# Patient Record
Sex: Male | Born: 1940 | Race: Black or African American | Hispanic: No | Marital: Married | Smoking: Never smoker
Health system: Southern US, Community
[De-identification: ages and names within clinical notes are randomized; demographics above are authoritative.]

## PROBLEM LIST (undated history)

## (undated) DIAGNOSIS — I10 Essential (primary) hypertension: Secondary | ICD-10-CM

## (undated) DIAGNOSIS — M1990 Unspecified osteoarthritis, unspecified site: Secondary | ICD-10-CM

## (undated) DIAGNOSIS — I639 Cerebral infarction, unspecified: Secondary | ICD-10-CM

## (undated) DIAGNOSIS — Z950 Presence of cardiac pacemaker: Secondary | ICD-10-CM

## (undated) DIAGNOSIS — R569 Unspecified convulsions: Secondary | ICD-10-CM

## (undated) DIAGNOSIS — I739 Peripheral vascular disease, unspecified: Secondary | ICD-10-CM

## (undated) DIAGNOSIS — F0390 Unspecified dementia without behavioral disturbance: Secondary | ICD-10-CM

## (undated) DIAGNOSIS — I779 Disorder of arteries and arterioles, unspecified: Secondary | ICD-10-CM

## (undated) DIAGNOSIS — C61 Malignant neoplasm of prostate: Secondary | ICD-10-CM

## (undated) HISTORY — PX: PACEMAKER INSERTION: SHX728

## (undated) HISTORY — PX: OTHER SURGICAL HISTORY: SHX169

## (undated) HISTORY — PX: PROSTATE CRYOABLATION: SUR358

## (undated) HISTORY — PX: CAROTID STENT: SHX1301

---

## 2010-05-22 ENCOUNTER — Encounter: Payer: Self-pay | Admitting: Internal Medicine

## 2010-06-01 ENCOUNTER — Encounter: Payer: Self-pay | Admitting: Orthopedic Surgery

## 2010-06-02 ENCOUNTER — Encounter: Payer: Self-pay | Admitting: Internal Medicine

## 2010-06-02 ENCOUNTER — Encounter: Payer: Self-pay | Admitting: Orthopedic Surgery

## 2010-07-03 ENCOUNTER — Encounter: Payer: Self-pay | Admitting: Orthopedic Surgery

## 2010-07-19 ENCOUNTER — Emergency Department: Payer: Self-pay | Admitting: Emergency Medicine

## 2010-08-02 ENCOUNTER — Encounter: Payer: Self-pay | Admitting: Orthopedic Surgery

## 2010-11-20 ENCOUNTER — Emergency Department: Payer: Self-pay | Admitting: Emergency Medicine

## 2011-06-06 ENCOUNTER — Other Ambulatory Visit: Payer: Self-pay

## 2011-08-15 ENCOUNTER — Other Ambulatory Visit: Payer: Self-pay

## 2011-09-03 ENCOUNTER — Other Ambulatory Visit: Payer: Self-pay

## 2011-09-13 ENCOUNTER — Other Ambulatory Visit: Payer: Self-pay

## 2011-09-13 LAB — CBC WITH DIFFERENTIAL/PLATELET
Basophil #: 0 10*3/uL (ref 0.0–0.1)
Eosinophil #: 0.3 10*3/uL (ref 0.0–0.7)
MCH: 32.7 pg (ref 26.0–34.0)
MCHC: 33.7 g/dL (ref 32.0–36.0)
Monocyte #: 0.6 10*3/uL (ref 0.0–0.7)
Neutrophil #: 1.9 10*3/uL (ref 1.4–6.5)
Neutrophil %: 47.4 %
Platelet: 122 10*3/uL — ABNORMAL LOW (ref 150–440)
RBC: 3.78 10*6/uL — ABNORMAL LOW (ref 4.40–5.90)

## 2011-09-13 LAB — ALT: SGPT (ALT): 38 U/L

## 2011-10-04 ENCOUNTER — Other Ambulatory Visit: Payer: Self-pay

## 2012-10-13 ENCOUNTER — Emergency Department: Payer: Self-pay | Admitting: Emergency Medicine

## 2012-10-13 LAB — COMPREHENSIVE METABOLIC PANEL
BUN: 16 mg/dL (ref 7–18)
Bilirubin,Total: 0.5 mg/dL (ref 0.2–1.0)
Co2: 27 mmol/L (ref 21–32)
Creatinine: 0.96 mg/dL (ref 0.60–1.30)
EGFR (African American): >60
EGFR (Non-African Amer.): >60
Osmolality: 280 (ref 275–301)
Potassium: 4.2 mmol/L (ref 3.5–5.1)
SGOT(AST): 24 U/L (ref 15–37)
SGPT (ALT): 17 U/L (ref 12–78)
Sodium: 140 mmol/L (ref 136–145)
Total Protein: 7.7 g/dL (ref 6.4–8.2)

## 2012-10-13 LAB — CBC
HGB: 13.3 g/dL (ref 13.0–18.0)
MCH: 32.1 pg (ref 26.0–34.0)
RDW: 14.6 % — ABNORMAL HIGH (ref 11.5–14.5)
WBC: 4.5 10*3/uL (ref 3.8–10.6)

## 2012-10-13 LAB — TROPONIN I: Troponin-I: <0.02 ng/mL

## 2012-10-13 LAB — CK TOTAL AND CKMB (NOT AT ARMC): CK, Total: 55 U/L (ref 35–232)

## 2012-10-13 LAB — PRO B NATRIURETIC PEPTIDE: B-Type Natriuretic Peptide: 649 pg/mL — ABNORMAL HIGH (ref 0–125)

## 2013-12-15 LAB — URINALYSIS, COMPLETE
BILIRUBIN, UR: NEGATIVE
Bacteria: NONE SEEN
Blood: NEGATIVE
Glucose,UR: NEGATIVE mg/dL (ref 0–75)
Hyaline Cast: 4
Ketone: NEGATIVE
Leukocyte Esterase: NEGATIVE
Nitrite: NEGATIVE
Ph: 5 (ref 4.5–8.0)
Protein: NEGATIVE
RBC,UR: <1 /HPF (ref 0–5)
SPECIFIC GRAVITY: 1.017 (ref 1.003–1.030)
Squamous Epithelial: <1

## 2013-12-15 LAB — CBC
HCT: 34.7 % — ABNORMAL LOW (ref 40.0–52.0)
HGB: 11.6 g/dL — AB (ref 13.0–18.0)
MCH: 30.9 pg (ref 26.0–34.0)
MCHC: 33.5 g/dL (ref 32.0–36.0)
MCV: 92 fL (ref 80–100)
Platelet: 128 10*3/uL — ABNORMAL LOW (ref 150–440)
RBC: 3.77 10*6/uL — AB (ref 4.40–5.90)
RDW: 14.8 % — AB (ref 11.5–14.5)
WBC: 5.2 10*3/uL (ref 3.8–10.6)

## 2013-12-15 LAB — COMPREHENSIVE METABOLIC PANEL
ALT: 16 U/L (ref 12–78)
Albumin: 2.9 g/dL — ABNORMAL LOW (ref 3.4–5.0)
Alkaline Phosphatase: 56 U/L
Anion Gap: 4 — ABNORMAL LOW (ref 7–16)
BUN: 19 mg/dL — AB (ref 7–18)
Bilirubin,Total: 0.6 mg/dL (ref 0.2–1.0)
Calcium, Total: 8.3 mg/dL — ABNORMAL LOW (ref 8.5–10.1)
Chloride: 107 mmol/L (ref 98–107)
Co2: 26 mmol/L (ref 21–32)
Creatinine: 1.19 mg/dL (ref 0.60–1.30)
EGFR (Non-African Amer.): 60 — ABNORMAL LOW
GLUCOSE: 116 mg/dL — AB (ref 65–99)
Osmolality: 277 (ref 275–301)
POTASSIUM: 3.8 mmol/L (ref 3.5–5.1)
SGOT(AST): 24 U/L (ref 15–37)
Sodium: 137 mmol/L (ref 136–145)
TOTAL PROTEIN: 7.4 g/dL (ref 6.4–8.2)

## 2013-12-15 LAB — TROPONIN I: Troponin-I: <0.02 ng/mL

## 2013-12-16 ENCOUNTER — Inpatient Hospital Stay: Payer: Self-pay | Admitting: Specialist

## 2014-01-21 ENCOUNTER — Emergency Department: Payer: Self-pay | Admitting: Emergency Medicine

## 2014-01-21 LAB — COMPREHENSIVE METABOLIC PANEL
ANION GAP: 4 — AB (ref 7–16)
AST: 24 U/L (ref 15–37)
Albumin: 2.8 g/dL — ABNORMAL LOW (ref 3.4–5.0)
Alkaline Phosphatase: 51 U/L
BUN: 25 mg/dL — ABNORMAL HIGH (ref 7–18)
Bilirubin,Total: 0.6 mg/dL (ref 0.2–1.0)
CHLORIDE: 107 mmol/L (ref 98–107)
Calcium, Total: 8.4 mg/dL — ABNORMAL LOW (ref 8.5–10.1)
Co2: 28 mmol/L (ref 21–32)
Creatinine: 1.32 mg/dL — ABNORMAL HIGH (ref 0.60–1.30)
EGFR (African American): >60
GFR CALC NON AF AMER: 53 — AB
GLUCOSE: 90 mg/dL (ref 65–99)
Osmolality: 281 (ref 275–301)
POTASSIUM: 4.1 mmol/L (ref 3.5–5.1)
SGPT (ALT): 11 U/L — ABNORMAL LOW (ref 12–78)
Sodium: 139 mmol/L (ref 136–145)
TOTAL PROTEIN: 7 g/dL (ref 6.4–8.2)

## 2014-01-21 LAB — CBC
HCT: 33.2 % — ABNORMAL LOW (ref 40.0–52.0)
HGB: 11.4 g/dL — ABNORMAL LOW (ref 13.0–18.0)
MCH: 31.9 pg (ref 26.0–34.0)
MCHC: 34.3 g/dL (ref 32.0–36.0)
MCV: 93 fL (ref 80–100)
PLATELETS: 106 10*3/uL — AB (ref 150–440)
RBC: 3.56 10*6/uL — AB (ref 4.40–5.90)
RDW: 15.5 % — ABNORMAL HIGH (ref 11.5–14.5)
WBC: 4.7 10*3/uL (ref 3.8–10.6)

## 2014-01-21 LAB — TROPONIN I: Troponin-I: <0.02 ng/mL

## 2014-02-02 ENCOUNTER — Ambulatory Visit: Payer: Self-pay | Admitting: Vascular Surgery

## 2014-02-02 LAB — BASIC METABOLIC PANEL
Anion Gap: 5 — ABNORMAL LOW (ref 7–16)
BUN: 21 mg/dL — ABNORMAL HIGH (ref 7–18)
CO2: 29 mmol/L (ref 21–32)
Calcium, Total: 8.9 mg/dL (ref 8.5–10.1)
Chloride: 103 mmol/L (ref 98–107)
Creatinine: 1.37 mg/dL — ABNORMAL HIGH (ref 0.60–1.30)
EGFR (African American): 58 — ABNORMAL LOW
EGFR (Non-African Amer.): 50 — ABNORMAL LOW
Glucose: 93 mg/dL (ref 65–99)
OSMOLALITY: 276 (ref 275–301)
Potassium: 3.7 mmol/L (ref 3.5–5.1)
Sodium: 137 mmol/L (ref 136–145)

## 2014-02-02 LAB — CBC
HCT: 36.2 % — AB (ref 40.0–52.0)
HGB: 12.3 g/dL — ABNORMAL LOW (ref 13.0–18.0)
MCH: 31.8 pg (ref 26.0–34.0)
MCHC: 34 g/dL (ref 32.0–36.0)
MCV: 94 fL (ref 80–100)
Platelet: 122 10*3/uL — ABNORMAL LOW (ref 150–440)
RBC: 3.88 10*6/uL — ABNORMAL LOW (ref 4.40–5.90)
RDW: 15.2 % — ABNORMAL HIGH (ref 11.5–14.5)
WBC: 4 10*3/uL (ref 3.8–10.6)

## 2014-02-02 LAB — URINALYSIS, COMPLETE
BACTERIA: NONE SEEN
Bilirubin,UR: NEGATIVE
Blood: NEGATIVE
Glucose,UR: NEGATIVE mg/dL (ref 0–75)
Hyaline Cast: 31
KETONE: NEGATIVE
Leukocyte Esterase: NEGATIVE
Nitrite: NEGATIVE
PROTEIN: NEGATIVE
Ph: 5 (ref 4.5–8.0)
RBC,UR: 2 /HPF (ref 0–5)
Specific Gravity: 1.023 (ref 1.003–1.030)
Squamous Epithelial: 1
WBC UR: 2 /HPF (ref 0–5)

## 2014-02-09 LAB — COMPREHENSIVE METABOLIC PANEL
ALBUMIN: 2.8 g/dL — AB (ref 3.4–5.0)
Alkaline Phosphatase: 51 U/L
Anion Gap: 2 — ABNORMAL LOW (ref 7–16)
BUN: 22 mg/dL — ABNORMAL HIGH (ref 7–18)
Bilirubin,Total: 0.9 mg/dL (ref 0.2–1.0)
CHLORIDE: 110 mmol/L — AB (ref 98–107)
CREATININE: 1.34 mg/dL — AB (ref 0.60–1.30)
Calcium, Total: 8.4 mg/dL — ABNORMAL LOW (ref 8.5–10.1)
Co2: 28 mmol/L (ref 21–32)
EGFR (African American): >60
EGFR (Non-African Amer.): 52 — ABNORMAL LOW
Glucose: 83 mg/dL (ref 65–99)
Osmolality: 282 (ref 275–301)
Potassium: 4.1 mmol/L (ref 3.5–5.1)
SGOT(AST): 22 U/L (ref 15–37)
SGPT (ALT): 11 U/L — ABNORMAL LOW (ref 12–78)
Sodium: 140 mmol/L (ref 136–145)
TOTAL PROTEIN: 6.5 g/dL (ref 6.4–8.2)

## 2014-02-09 LAB — CBC WITH DIFFERENTIAL/PLATELET
BASOS ABS: 0 10*3/uL (ref 0.0–0.1)
BASOS PCT: 1 %
EOS ABS: 0.2 10*3/uL (ref 0.0–0.7)
Eosinophil %: 3.5 %
HCT: 33.1 % — ABNORMAL LOW (ref 40.0–52.0)
HGB: 11 g/dL — ABNORMAL LOW (ref 13.0–18.0)
Lymphocyte #: 1 10*3/uL (ref 1.0–3.6)
Lymphocyte %: 23.9 %
MCH: 31.1 pg (ref 26.0–34.0)
MCHC: 33.4 g/dL (ref 32.0–36.0)
MCV: 93 fL (ref 80–100)
MONO ABS: 0.6 x10 3/mm (ref 0.2–1.0)
Monocyte %: 12.9 %
Neutrophil #: 2.5 10*3/uL (ref 1.4–6.5)
Neutrophil %: 58.7 %
PLATELETS: 106 10*3/uL — AB (ref 150–440)
RBC: 3.56 10*6/uL — ABNORMAL LOW (ref 4.40–5.90)
RDW: 15.2 % — ABNORMAL HIGH (ref 11.5–14.5)
WBC: 4.3 10*3/uL (ref 3.8–10.6)

## 2014-02-09 LAB — CK TOTAL AND CKMB (NOT AT ARMC)
CK, Total: 29 U/L — ABNORMAL LOW
CK, Total: 33 U/L — ABNORMAL LOW
CK-MB: 0.6 ng/mL (ref 0.5–3.6)
CK-MB: 0.7 ng/mL (ref 0.5–3.6)

## 2014-02-09 LAB — TROPONIN I
Troponin-I: <0.02 ng/mL
Troponin-I: <0.02 ng/mL

## 2014-02-09 LAB — APTT: Activated PTT: 25.7 secs (ref 23.6–35.9)

## 2014-02-10 ENCOUNTER — Inpatient Hospital Stay: Payer: Self-pay | Admitting: Internal Medicine

## 2014-02-10 LAB — LIPID PANEL
Cholesterol: 104 mg/dL (ref 0–200)
HDL: 35 mg/dL — AB (ref 40–60)
Ldl Cholesterol, Calc: 59 mg/dL (ref 0–100)
TRIGLYCERIDES: 50 mg/dL (ref 0–200)
VLDL Cholesterol, Calc: 10 mg/dL (ref 5–40)

## 2014-02-10 LAB — CK TOTAL AND CKMB (NOT AT ARMC)
CK, Total: 38 U/L — ABNORMAL LOW
CK-MB: 0.8 ng/mL (ref 0.5–3.6)

## 2014-02-10 LAB — BASIC METABOLIC PANEL
ANION GAP: 3 — AB (ref 7–16)
BUN: 21 mg/dL — ABNORMAL HIGH (ref 7–18)
CHLORIDE: 109 mmol/L — AB (ref 98–107)
CO2: 28 mmol/L (ref 21–32)
CREATININE: 1.06 mg/dL (ref 0.60–1.30)
Calcium, Total: 8.2 mg/dL — ABNORMAL LOW (ref 8.5–10.1)
EGFR (Non-African Amer.): >60
Glucose: 84 mg/dL (ref 65–99)
Osmolality: 282 (ref 275–301)
Potassium: 3.7 mmol/L (ref 3.5–5.1)
Sodium: 140 mmol/L (ref 136–145)

## 2014-02-10 LAB — CBC WITH DIFFERENTIAL/PLATELET
Basophil #: 0 10*3/uL (ref 0.0–0.1)
Basophil %: 0.8 %
EOS PCT: 8.6 %
Eosinophil #: 0.4 10*3/uL (ref 0.0–0.7)
HCT: 32.1 % — ABNORMAL LOW (ref 40.0–52.0)
HGB: 10.7 g/dL — AB (ref 13.0–18.0)
LYMPHS PCT: 33.7 %
Lymphocyte #: 1.5 10*3/uL (ref 1.0–3.6)
MCH: 31.1 pg (ref 26.0–34.0)
MCHC: 33.3 g/dL (ref 32.0–36.0)
MCV: 93 fL (ref 80–100)
Monocyte #: 0.6 x10 3/mm (ref 0.2–1.0)
Monocyte %: 13.9 %
NEUTROS PCT: 43 %
Neutrophil #: 1.9 10*3/uL (ref 1.4–6.5)
Platelet: 100 10*3/uL — ABNORMAL LOW (ref 150–440)
RBC: 3.44 10*6/uL — ABNORMAL LOW (ref 4.40–5.90)
RDW: 15.4 % — ABNORMAL HIGH (ref 11.5–14.5)
WBC: 4.3 10*3/uL (ref 3.8–10.6)

## 2014-02-10 LAB — TROPONIN I

## 2014-02-10 LAB — MAGNESIUM: Magnesium: 1.8 mg/dL

## 2014-02-10 LAB — TSH: THYROID STIMULATING HORM: 1.13 u[IU]/mL

## 2014-02-17 ENCOUNTER — Inpatient Hospital Stay: Payer: Self-pay | Admitting: Vascular Surgery

## 2014-02-17 LAB — BASIC METABOLIC PANEL
Anion Gap: 8 (ref 7–16)
BUN: 15 mg/dL (ref 7–18)
Calcium, Total: 8.6 mg/dL (ref 8.5–10.1)
Chloride: 108 mmol/L — ABNORMAL HIGH (ref 98–107)
Co2: 25 mmol/L (ref 21–32)
Creatinine: 0.94 mg/dL (ref 0.60–1.30)
EGFR (African American): >60
EGFR (Non-African Amer.): >60
GLUCOSE: 89 mg/dL (ref 65–99)
Osmolality: 282 (ref 275–301)
Potassium: 3.9 mmol/L (ref 3.5–5.1)
Sodium: 141 mmol/L (ref 136–145)

## 2014-02-17 LAB — CK TOTAL AND CKMB (NOT AT ARMC)
CK, TOTAL: 25 U/L — AB
CK-MB: 0.6 ng/mL (ref 0.5–3.6)

## 2014-02-17 LAB — TROPONIN I

## 2014-02-18 LAB — BASIC METABOLIC PANEL
ANION GAP: 6 — AB (ref 7–16)
BUN: 12 mg/dL (ref 7–18)
CO2: 24 mmol/L (ref 21–32)
Calcium, Total: 7.9 mg/dL — ABNORMAL LOW (ref 8.5–10.1)
Chloride: 110 mmol/L — ABNORMAL HIGH (ref 98–107)
Creatinine: 0.89 mg/dL (ref 0.60–1.30)
EGFR (Non-African Amer.): >60
GLUCOSE: 77 mg/dL (ref 65–99)
Osmolality: 278 (ref 275–301)
Potassium: 4 mmol/L (ref 3.5–5.1)
Sodium: 140 mmol/L (ref 136–145)

## 2014-02-18 LAB — CBC WITH DIFFERENTIAL/PLATELET
Basophil #: 0 10*3/uL (ref 0.0–0.1)
Basophil %: 0.5 %
EOS PCT: 7.7 %
Eosinophil #: 0.3 10*3/uL (ref 0.0–0.7)
HCT: 27.8 % — AB (ref 40.0–52.0)
HGB: 9.5 g/dL — AB (ref 13.0–18.0)
Lymphocyte #: 1 10*3/uL (ref 1.0–3.6)
Lymphocyte %: 22.9 %
MCH: 32.1 pg (ref 26.0–34.0)
MCHC: 34.3 g/dL (ref 32.0–36.0)
MCV: 93 fL (ref 80–100)
MONOS PCT: 12.2 %
Monocyte #: 0.5 x10 3/mm (ref 0.2–1.0)
NEUTROS PCT: 56.7 %
Neutrophil #: 2.5 10*3/uL (ref 1.4–6.5)
Platelet: 88 10*3/uL — ABNORMAL LOW (ref 150–440)
RBC: 2.97 10*6/uL — ABNORMAL LOW (ref 4.40–5.90)
RDW: 15.1 % — ABNORMAL HIGH (ref 11.5–14.5)
WBC: 4.5 10*3/uL (ref 3.8–10.6)

## 2014-02-18 LAB — PROTIME-INR
INR: 1.2
Prothrombin Time: 14.7 secs (ref 11.5–14.7)

## 2014-02-18 LAB — APTT: ACTIVATED PTT: 27.7 s (ref 23.6–35.9)

## 2014-04-23 ENCOUNTER — Emergency Department: Payer: Self-pay | Admitting: Emergency Medicine

## 2014-04-23 LAB — BASIC METABOLIC PANEL
ANION GAP: 8 (ref 7–16)
BUN: 23 mg/dL — ABNORMAL HIGH (ref 7–18)
CALCIUM: 8.3 mg/dL — AB (ref 8.5–10.1)
Chloride: 105 mmol/L (ref 98–107)
Co2: 24 mmol/L (ref 21–32)
Creatinine: 1.17 mg/dL (ref 0.60–1.30)
EGFR (African American): >60
Glucose: 114 mg/dL — ABNORMAL HIGH (ref 65–99)
Osmolality: 278 (ref 275–301)
Potassium: 3.9 mmol/L (ref 3.5–5.1)
SODIUM: 137 mmol/L (ref 136–145)

## 2014-04-23 LAB — DIFFERENTIAL
Basophil #: 0 10*3/uL (ref 0.0–0.1)
Basophil %: 0.2 %
EOS PCT: 3.5 %
Eosinophil #: 0.1 10*3/uL (ref 0.0–0.7)
LYMPHS ABS: 1 10*3/uL (ref 1.0–3.6)
Lymphocyte %: 34.5 %
Monocyte #: 0.3 x10 3/mm (ref 0.2–1.0)
Monocyte %: 10.1 %
NEUTROS PCT: 51.7 %
Neutrophil #: 1.5 10*3/uL (ref 1.4–6.5)

## 2014-04-23 LAB — CBC
HCT: 38.8 % — ABNORMAL LOW (ref 40.0–52.0)
HGB: 12.9 g/dL — ABNORMAL LOW (ref 13.0–18.0)
MCH: 32.4 pg (ref 26.0–34.0)
MCHC: 33.4 g/dL (ref 32.0–36.0)
MCV: 97 fL (ref 80–100)
Platelet: 105 10*3/uL — ABNORMAL LOW (ref 150–440)
RBC: 4 10*6/uL — AB (ref 4.40–5.90)
RDW: 15.3 % — AB (ref 11.5–14.5)
WBC: 2.9 10*3/uL — ABNORMAL LOW (ref 3.8–10.6)

## 2014-04-23 LAB — TROPONIN I: Troponin-I: <0.02 ng/mL

## 2014-06-10 ENCOUNTER — Observation Stay: Payer: Self-pay | Admitting: Internal Medicine

## 2014-06-10 LAB — CBC
HCT: 35.5 % — ABNORMAL LOW (ref 40.0–52.0)
HGB: 11.6 g/dL — ABNORMAL LOW (ref 13.0–18.0)
MCH: 32.2 pg (ref 26.0–34.0)
MCHC: 32.6 g/dL (ref 32.0–36.0)
MCV: 99 fL (ref 80–100)
Platelet: 106 10*3/uL — ABNORMAL LOW (ref 150–440)
RBC: 3.59 10*6/uL — ABNORMAL LOW (ref 4.40–5.90)
RDW: 15.1 % — ABNORMAL HIGH (ref 11.5–14.5)
WBC: 5.1 10*3/uL (ref 3.8–10.6)

## 2014-06-10 LAB — TROPONIN I: Troponin-I: <0.02 ng/mL

## 2014-06-10 LAB — BASIC METABOLIC PANEL
Anion Gap: 8 (ref 7–16)
BUN: 22 mg/dL — ABNORMAL HIGH (ref 7–18)
CALCIUM: 8.3 mg/dL — AB (ref 8.5–10.1)
CO2: 24 mmol/L (ref 21–32)
Chloride: 110 mmol/L — ABNORMAL HIGH (ref 98–107)
Creatinine: 1.15 mg/dL (ref 0.60–1.30)
EGFR (Non-African Amer.): >60
Glucose: 94 mg/dL (ref 65–99)
OSMOLALITY: 286 (ref 275–301)
POTASSIUM: 4 mmol/L (ref 3.5–5.1)
Sodium: 142 mmol/L (ref 136–145)

## 2014-06-10 LAB — CK TOTAL AND CKMB (NOT AT ARMC)
CK, Total: 47 U/L
CK-MB: 0.8 ng/mL (ref 0.5–3.6)

## 2014-06-10 LAB — PROTIME-INR
INR: 1.1
Prothrombin Time: 13.8 secs (ref 11.5–14.7)

## 2014-10-03 ENCOUNTER — Ambulatory Visit: Payer: Self-pay | Admitting: Internal Medicine

## 2014-10-03 LAB — BASIC METABOLIC PANEL
Anion Gap: 3 — ABNORMAL LOW (ref 7–16)
BUN: 18 mg/dL (ref 7–18)
Calcium, Total: 8.8 mg/dL (ref 8.5–10.1)
Chloride: 110 mmol/L — ABNORMAL HIGH (ref 98–107)
Co2: 27 mmol/L (ref 21–32)
Creatinine: 1.09 mg/dL (ref 0.60–1.30)
EGFR (African American): >60
EGFR (Non-African Amer.): >60
Glucose: 85 mg/dL (ref 65–99)
Osmolality: 281 (ref 275–301)
Potassium: 4.1 mmol/L (ref 3.5–5.1)
Sodium: 140 mmol/L (ref 136–145)

## 2014-10-03 LAB — CBC WITH DIFFERENTIAL/PLATELET
Basophil #: 0 10*3/uL (ref 0.0–0.1)
Basophil %: 1 %
Eosinophil #: 0.2 10*3/uL (ref 0.0–0.7)
Eosinophil %: 6.2 %
HCT: 36.1 % — ABNORMAL LOW (ref 40.0–52.0)
HGB: 12.2 g/dL — ABNORMAL LOW (ref 13.0–18.0)
Lymphocyte #: 0.9 10*3/uL — ABNORMAL LOW (ref 1.0–3.6)
Lymphocyte %: 25.6 %
MCH: 32.2 pg (ref 26.0–34.0)
MCHC: 33.7 g/dL (ref 32.0–36.0)
MCV: 95 fL (ref 80–100)
Monocyte #: 0.5 x10 3/mm (ref 0.2–1.0)
Monocyte %: 14.6 %
Neutrophil #: 1.9 10*3/uL (ref 1.4–6.5)
Neutrophil %: 52.6 %
Platelet: 93 10*3/uL — ABNORMAL LOW (ref 150–440)
RBC: 3.78 10*6/uL — ABNORMAL LOW (ref 4.40–5.90)
RDW: 15.7 % — ABNORMAL HIGH (ref 11.5–14.5)
WBC: 3.6 10*3/uL — ABNORMAL LOW (ref 3.8–10.6)

## 2014-10-03 LAB — APTT: Activated PTT: 25.2 s

## 2014-10-03 LAB — PROTIME-INR
INR: 1.1
Prothrombin Time: 14.5 secs

## 2014-10-05 ENCOUNTER — Ambulatory Visit: Payer: Self-pay | Admitting: Internal Medicine

## 2014-10-23 ENCOUNTER — Emergency Department: Payer: Self-pay | Admitting: Emergency Medicine

## 2014-12-24 NOTE — Consult Note (Signed)
Referring Physician:  Vivien Presto :   Primary Care Physician:  Vivien Presto : [place], [facility], [address], [place], [place] [address], [place] [phone]  Reason for Consult: Admit Date: 09-Feb-2014  Chief Complaint: syncope  Reason for Consult: unresponsive   History of Present Illness: History of Present Illness:   74 yo RHD M presents to [facility] after having a scheduled surgery for carotid endartectomy.  Prior to procedure, pt became confused and reports some blurry vision.  Pt remembers the entire episode even though he was confused.  He did not pass out.  he denies shaking, incontinence, tongue biting, weakness, numbness or headache.  This was different from the episode that he had in May in which he did not remember.  ROS:  General denies complaints   HEENT no complaints   Lungs no complaints   Cardiac no complaints   GI no complaints   GU no complaints   Musculoskeletal no complaints   Extremities no complaints   Skin no complaints   Neuro no complaints   Endocrine no complaints   Psych no complaints   Past Medical/Surgical Hx:  rheumatoid arthritis:   HTN:   prostate:   cholecystectomy:   left knee nodule removal:   left knee replacement:   Past Medical/ Surgical Hx:  Past Medical History reviewed by me as above   Past Surgical History reviewed by me as above   Home Medications: Medication Instructions Last Modified Date/Time  Keppra 500 mg tablet 1 tab(s) orally 2 times a day x 30 days 03-Jun-15 14:35  Vitamin D3 400 intl units oral tablet 1 tab(s) orally 2 times a day 03-Jun-15 14:35  Enbrel 50 mg/mL subcutaneous solution 1 milliliter(s) subcutaneous once a week on Wednesday 03-Jun-15 14:35  hydrochlorothiazide 25 mg oral tablet 1 tab(s) orally once a day 03-Jun-15 14:35  enalapril 20 mg oral tablet 1 tab(s) orally 2 times a day 03-Jun-15 14:35  methotrexate 2.5 mg oral tablet 8 tab(s) orally once  a week on Friday 03-Jun-15 14:35  Centrum Silver Therapeutic Multiple Vitamins with Minerals oral tablet 1 tab(s) orally once a day 03-Jun-15 14:35  aspirin 81 mg oral tablet 1 tab(s) orally once a day 13-KGM-01 02:72  folic acid 1 mg oral tablet 1 tab(s) orally once a day 03-Jun-15 14:35  isosorbide mononitrate extended release 30 mg oral tablet, extended release 1 tab(s) orally once a day (in the morning) 03-Jun-15 14:35  terazosin 1 mg oral capsule 2 cap(s) orally once a day (at bedtime) 03-Jun-15 14:35  polyethylene glycol 3350 - oral powder for reconstitution 17 gram(s) orally once a day 03-Jun-15 14:35  diphenhydrAMINE 50 mg oral capsule 1 cap(s) orally once a day (at bedtime), As Needed 03-Jun-15 14:35  Tylenol 500 mg oral tablet 2 tab(s) orally every 6 hours, As Needed 03-Jun-15 14:35   Allergies:  Sulfa drugs: Hives  Allergies:  Allergies sulfa   Social/Family History: Employment Status: retired  Lives With: spouse  Living Arrangements: house  Social History: no tob, no EtOh, no illicits  Family History: no strokes, no seizures   Vital Signs: **Vital Signs.:   11-Jun-15 08:00  Temperature Temperature (F) 98  Celsius 36.6  Pulse Pulse 51  Respirations Respirations 20  Systolic BP Systolic BP 536  Diastolic BP (mmHg) Diastolic BP (mmHg) 89  Mean BP 115  Pulse Ox % Pulse Ox % 96  Oxygen Delivery Room Air/ 21 %   Physical Exam: General: nl weight, NAD  HEENT: normocephalic,  sclera nonicteric, oropharynx clear  Neck: supple, no JVD, no bruits  Chest: CTA B, no wheezing, good movement  Cardiac: RRR, no murmurs, no edema, 2+ pulses  Extremities: no C/C/E, FROM   Neurologic Exam: Mental Status: alert and oriented x 3, normal speech and language, follows complex commands  Cranial Nerves: PERRLA, EOMI, nl VF, face symmetric, tongue midline, shoulder shrug equal  Motor Exam: 5/5 B normal, tone, no tremor  Deep Tendon Reflexes: 1+/4 B, plantars downgoing B, no Hoffman   Sensory Exam: pinprick, temperature, and vibration intact B  Coordination: FTN and HTS WNL   Lab Results: Thyroid:  11-Jun-15 01:28   Thyroid Stimulating Hormone 1.13 (0.45-4.50 (International Unit)  ----------------------- Pregnant patients have  different reference  ranges for TSH:  - - - - - - - - - -  Pregnant, first trimetser:  0.36 - 2.50 uIU/mL)  LabObservation:  10-Jun-15 19:12   OBSERVATION Reason for Test  Hepatic:  10-Jun-15 13:07   Bilirubin, Total 0.9  Alkaline Phosphatase 51 (45-117 NOTE: New Reference Range 07/23/13)  SGPT (ALT)  11  SGOT (AST) 22  Total Protein, Serum 6.5  Albumin, Serum  2.8  Routine Chem:  11-Jun-15 01:28   Glucose, Serum 84  BUN  21  Creatinine (comp) 1.06  Sodium, Serum 140  Potassium, Serum 3.7  Chloride, Serum  109  CO2, Serum 28  Calcium (Total), Serum  8.2  Anion Gap  3  Osmolality (calc) 282  eGFR (African American) >60  eGFR (Non-African American) >60 (eGFR values <68m/min/1.73 m2 may be an indication of chronic kidney disease (CKD). Calculated eGFR is useful in patients with stable renal function. The eGFR calculation will not be reliable in acutely ill patients when serum creatinine is changing rapidly. It is not useful in  patients on dialysis. The eGFR calculation may not be applicable to patients at the low and high extremes of body sizes, pregnant women, and vegetarians.)  Magnesium, Serum 1.8 (1.8-2.4 THERAPEUTIC RANGE: 4-7 mg/dL TOXIC: > 10 mg/dL  -----------------------)  Cholesterol, Serum 104  Triglycerides, Serum 50  HDL (INHOUSE)  35  VLDL Cholesterol Calculated 10  LDL Cholesterol Calculated 59 (Result(s) reported on 10 Feb 2014 at 02:19AM.)  Cardiac:  11-Jun-15 01:28   Troponin I < 0.02 (0.00-0.05 0.05 ng/mL or less: NEGATIVE  Repeat testing in 3-6 hrs  if clinically indicated. >0.05 ng/mL: POTENTIAL  MYOCARDIAL INJURY. Repeat  testing in 3-6 hrs if  clinically indicated. NOTE: An increase  or decrease  of 30% or more on serial  testing suggests a  clinically important change)  CK, Total  38 (39-308 NOTE: NEW REFERENCE RANGE  10/04/2013)  CPK-MB, Serum 0.8 (Result(s) reported on 10 Feb 2014 at 02:05AM.)  Routine Coag:  10-Jun-15 13:07   Activated PTT (APTT) 25.7 (A HCT value >55% may artifactually increase the APTT. In one study, the increase was an average of 19%. Reference: "Effect on Routine and Special Coagulation Testing Values of Citrate Anticoagulant Adjustment in Patients with High HCT Values." American Journal of Clinical Pathology 2006;126:400-405.)  Routine Hem:  11-Jun-15 01:28   WBC (CBC) 4.3  RBC (CBC)  3.44  Hemoglobin (CBC)  10.7  Hematocrit (CBC)  32.1  Platelet Count (CBC)  100  MCV 93  MCH 31.1  MCHC 33.3  RDW  15.4  Neutrophil % 43.0  Lymphocyte % 33.7  Monocyte % 13.9  Eosinophil % 8.6  Basophil % 0.8  Neutrophil # 1.9  Lymphocyte # 1.5  Monocyte # 0.6  Eosinophil # 0.4  Basophil # 0.0 (Result(s) reported on 10 Feb 2014 at 01:55AM.)   Radiology Results: MRI:    10-Jun-15 17:02, MRI Brain Without Contrast  MRI Brain Without Contrast   REASON FOR EXAM:    blurry vision, aphasia, eval for cva  COMMENTS:       PROCEDURE: MR  - MR BRAIN WO CONTRAST  - Feb 09 2014  5:02PM     CLINICAL DATA:  74 year old male with altered mental status and  hypotension. Visual and speech changes. Initial encounter.    EXAM:  MRI HEAD WITHOUT CONTRAST    TECHNIQUE:  Multiplanar, multiecho pulse sequences of the brain and surrounding  structures were obtained without intravenous contrast.  COMPARISON:  Head CT without contrast 01/21/2014. Brain MRI  12/16/2013.    FINDINGS:  6 mm focus of restricted diffusion in the right superior frontal  gyrus subcortical white matter (series 8, image 19 and series 10,  image 22). Nearby confluent cerebral white matter T2 and FLAIR  hyperintensity, also noted inthe left hemisphere to a lesser  extent. No other  restricted diffusion identified. Major intracranial  vascular flow voids are stable, persistent left trigeminal artery  re- identified.    No midline shift, mass effect, evidence of mass lesion,  ventriculomegaly, extra-axial collection or acute intracranial  hemorrhage. Cervicomedullary junction and pituitary are within  normal limits. Negative visualized cervical spine.    Stable gray and white matter signal elsewhere, including small  chronic lacunar infarcts in both cerebellar hemispheres, an the  medial left thalamus.    Visible internal auditory structures appear normal. Mastoids are  clear. Decreased paranasal sinus mucosal thickening. Stable orbits  soft tissues. Visualized scalp soft tissues are within normal  limits. Normal bone marrow signal.     IMPRESSION:  1. Small acute lacunar infarct right superior frontal gyrus  subcortical white matter. No mass effect or hemorrhage.  2. Otherwise stable underlying chronic ischemic disease.  Electronically Signed    By: Lars Pinks M.D.    On: 02/09/2014 17:13         Verified By: Gwenyth Bender. HALL, M.D.,   Radiology Impression: Radiology Impression: MRI of brain personally reviewed by me and shows a small acute R frontal infarct, lots of watershed disease   Impression/Recommendations: Recommendations:   prior notes reviewed by me reviewed by me   Small R frontal infarct-  this is currently asymptomatic and could be either thromboembolic or secondary to hypotension R carotid stenosis-  likely symptomatic Presyncope-  pt remained conciousness throughout the episode and this was different from episode in May d/c ASA start plavix 67m daily recommend carotid endartectomy or stent in the next 2 weeks would allow BP to ride closer to 140/85 until carotid surgery continue statin will sign off, [facility]  Electronic Signatures: SJamison Neighbor(MD)  (Signed 11-Jun-15 11:27)  Authored: REFERRING  PHYSICIAN, Primary Care Physician, Consult, History of Present Illness, Review of Systems, PAST MEDICAL/SURGICAL HISTORY, HOME MEDICATIONS, ALLERGIES, Social/Family History, NURSING VITAL SIGNS, Physical Exam-, LAB RESULTS, RADIOLOGY RESULTS, Recommendations   Last Updated: 11-Jun-15 11:27 by SJamison Neighbor(MD)

## 2014-12-24 NOTE — H&P (Signed)
PATIENT NAME:  Kenneth Vazquez, Kenneth Vazquez MR#:  [id number] DATE OF BIRTH:  [birth year]  DATE OF ADMISSION:  06/10/2014  PRIMARY CARE PHYSICIAN:  [facility] in [place]: Impaired speech today.   HISTORY OF PRESENT ILLNESS: Kenneth Vazquez is a pleasant 74 year old African American gentleman who presented to the Emergency Room after his wife noted he was confused transiently, along with some impaired speech, while they were at a gas station. EMS was called at the gas station and the episode lasted about 15-20 minutes. The patient is currently back to his normal baseline. No seizure activity was noted. There was no facial droop noted by the wife either. The patient is hemodynamically stable. He is being admitted for possible TIA.  He received aspirin in the Emergency Room.   PAST MEDICAL HISTORY:  1.  CVA with right carotid artery stenosis. He underwent stent placement in June 2015.  2.  Hypertension.  3.  Rheumatoid arthritis.  4.  BPH.   PAST SURGICAL HISTORY:  Left knee replacement and right carotid artery stent placement.   FAMILY HISTORY: Positive for coronary artery disease.   ALLERGIES: SULFA.   OUTPATIENT MEDICATIONS:  1.  Aspirin 81 mg p.o. daily.  2.  Centrum Silver p.o. daily.  3.  Diphenhydramine 50 mg at bedtime.  4.  Enbrel 50 mg per mL,  1 mL subcutaneously every Wednesday. 5.  Methotrexate 2.5 mg, 8 tablets once a week on Friday.  6.  Plavix 75 mg daily.  7.  Polyethylene glycol once a day as needed.  8.  Simvastatin 20 mg daily at bedtime.  9.  Tylenol 500 mg 2 tablets every 6 hours.  10. Vitamin D3 four hundred International Units b.i.d.   REVIEW OF SYSTEMS: CONSTITUTIONAL: No fever, fatigue, weakness.  EYES: No blurred or double vision.  ENT: No tinnitus, ear pain, hearing loss.  RESPIRATORY: No cough, wheeze, hemoptysis.  CARDIOVASCULAR: No chest pain, orthopnea, edema.  GASTROINTESTINAL: No nausea, vomiting, diarrhea, abdominal pain. No  GERD. GENITOURINARY: No dysuria, hematuria, or frequency.  ENDOCRINE: No polyuria, nocturia or thyroid problems.  HEMATOLOGY: No anemia or easy bruising.  SKIN: No acne or rash.  MUSCULOSKELETAL: Positive for rheumatoid arthritis. No swelling or gout.  NEUROLOGIC: Positive for impaired speech a little earlier, at present stable. No dementia or dysarthria or vertigo or seizures.  PSYCHIATRIC: No anxiety or depression.   All other systems reviewed are negative.   PHYSICAL EXAMINATION:  GENERAL: The patient is awake, alert, oriented x 3, not in acute distress. VITAL SIGNS: Afebrile, pulse is 63. Blood pressure is 129/86. Oxygen saturation 95% on room air.  HEENT: Atraumatic, normocephalic. PERRLA. EOMI. Oral mucosa is moist.  NECK: Supple. No JVD. No carotid bruit.  RESPIRATORY:  Clear to auscultation bilaterally. No rales, rhonchi, respiratory distress or labored breathing.  CARDIOVASCULAR: Both the heart sounds are normal. Rate, rhythm regular. PMI not lateralized. Chest is nontender.  EXTREMITIES: Good pedal pulses, good femoral pulses. No lower extremity edema.  ABDOMEN: Soft, benign, nontender. No organomegaly. Bowel sounds are positive.  EXTREMITIES: Good pedal pulses, good femoral pulses. No lower extremity edema.  NEUROLOGIC: Grossly unremarkable cranial nerves II-XII. No motor or sensory deficits. Speech clear. No facial droop. Both upper and lower extremities 4+/5 motor power. Sensory exam within normal limits.  PSYCHIATRIC: The patient is awake, alert, oriented x3.  SKIN: Warm and dry.   LABORATORY DATA:  First set of cardiac enzymes negative. CBC within normal limits except platelet count of 106,000.  Hemoglobin and hematocrit 11.6 and 35.5. Glucose is 94, BUN 22. The rest of the chemistry is normal.   DIAGNOSTIC DATA: CT of the head without contrast shows atrophy with small vessel disease, patchy throughout, common throughout the centrum semiovale bilaterally and right lentiform  nucleus.  No acute infarct noted.   ASSESSMENT:  Kenneth Vazquez is a 74 year old with history of transient ischemic  attack/cerebrovascular accident in the past, comes in with:  1.  Suspected transient ischemic attack. The patient came in with some dysarthria and confusion as noted by his wife at a gas station. His symptoms have resolved. We will continue aspirin and Plavix for now. Monitor neuro checks q. 4 hours.  The patient recently had work-up in the summer of 2015 where he had a right carotid stent placement. I will not repeat ultrasound of the carotids at present. His echo showed EF of 50%. Will monitor neuro checks.  If repeat symptoms occur, then consider doing MRI of the brain. The above was discussed with the patient and his wife, agreeable to it.  2.  Hyperlipidemia. Continue simvastatin.  3.  Rheumatoid arthritis. It is stable. The patient is on methotrexate and Enbrel.  4.  Deep vein thrombosis prophylaxis. TEDs and SCDs.  The patient has low platelets; I will avoid antiplatelet agents.   The patient is a full code.  This was discussed with the patient and his wife.   TIME SPENT: 50 minutes.   ____________________________ Hart Rochester Posey Pronto, MD sap:lt D: 06/10/2014 18:25:47 ET T: 06/10/2014 19:48:40 ET JOB#: [id number]  cc: Shawnta Schlegel A. Posey Pronto, MD, <Dictator> [facility], [facility] Ilda Basset MD ELECTRONICALLY SIGNED 06/20/2014 15:52

## 2014-12-24 NOTE — Op Note (Signed)
PATIENT NAME:  Kenneth Vazquez, Kenneth Vazquez MR#:  [id number] DATE OF BIRTH:  July 26, 1941  DATE OF PROCEDURE:  02/17/2014  PREOPERATIVE DIAGNOSIS:  Critical symptomatic stenosis of the right internal carotid artery.   POSTOPERATIVE DIAGNOSIS:  Critical symptomatic stenosis of the right internal carotid artery.  PROCEDURE PERFORMED:  1.  Arch aortogram. 2.  Selective injection cervical and cerebral views of the right carotid system.  3.  Percutaneous transluminal angioplasty and stent placement using embolic protection of the right internal bulb and common carotid artery.   SURGEON:  Katha Cabal, M.D.   ASSISTANT:  Dr. Lucky Cowboy.   SEDATION:  Versed 1 mg plus fentanyl 50 mcg administered IV.  Continuous ECG, pulse oximetry and cardiopulmonary monitoring is performed throughout the entire procedure by the interventional radiology nurse.  Total sedation time is 1 hour and 30 minutes.   ACCESS:  A 6 French sheath, right common femoral artery.   FLUOROSCOPY TIME:  13.2 minutes.   CONTRAST USED:  Isovue 55 mL.   INDICATIONS:  Kenneth Vazquez is a 74 year old gentleman who presented last week to the hospital in preparation for his open carotid endarterectomy and was found to have significant neurological changes.  Assessment by medicine and subsequently neurology suggested TIA and the patient was started on aspirin and Plavix.  He is noted to have a high bifurcation with the upper limit of the lesion at the top of C2 and given these new findings of a symptomatic lesion as well as dual antiplatelet therapy, it has been elected to stent the carotid lesion rather than operate in lieu of these findings.  The risks and benefits of stenting were reviewed again with the patient.  All questions were answered.  The patient has agreed to proceed.   DESCRIPTION OF PROCEDURE:  The patient is taken to the special procedure suite, placed in the supine position.  After adequate sedation is achieved, both groins are prepped and  draped in a sterile fashion.  Ultrasound is placed in a sterile sleeve.  Common femoral artery is identified.  It was echolucent and pulsatile indicating patency.  Femoral bifurcation is noted and the common femoral artery is scanned more proximally.  Access is then obtained under direct visualization with a Seldinger needle.  A J-wire is advanced, followed by a 5 [place] sheath and 5 French pigtail catheter.  The pigtail catheter is positioned in the ascending aorta and the LAO projection.  Arch aortogram was obtained.  7000 units of heparin was given and allowed to circulate.  A stiff angled Glidewire was then introduced and the pigtail catheter exchanged for a JB-1 catheter.  The common carotid artery was then selected after accessing the innominate and the Glidewire and catheter combination were advanced into the common carotid.  RAO projection was then obtained and the external carotid artery identified.  Glidewire and catheter were negotiated into the external iliac where the Amplatz Super Stiff wire was then exchanged for the stiff angled Glidewire.  On the first attempt the catheter flipped back out down into the innominate.  Subsequently the external was re-engaged and this time the Amplatz wire tracked without difficulty.  The catheter was then removed.  The 5 French sheath was removed and a 6 [place] shuttle sheath advanced up to within 2 cm of the bifurcation.  Unfortunately, upon withdrawing the Amplatz Super Stiff wire and the dilator, the shuttle sheath retracted down to the very proximal portion of the common carotid and therefore the stiff angled Glidewire was reintroduced, advanced  up into the external and subsequently the dilator was reintroduced and the catheter readvanced to within 2 cm of the bulb.  Wire and dilator were then removed and this time the sheath remained in the appropriate position.  ACT was checked and found to be 260 and an additional 2000 units of heparin was given.   Large  Emboshield was then advanced into the internal and the filter opened just below the siphon.  A 10 x 8 tapered 40 mm long Xact stent was subsequently advanced across the lesion.  Two small follow-up imagings were made to verify proper location.  The stent deployed without difficulty.  It was then postdilated with a 5 mm balloon inflated to 10 atmospheres.  The patient was pretreated with 0.5 mg of atropine and subsequently received 0.5 mg of atropine with the balloon inflation.    Follow-up imaging was obtained noting both AP, lateral, cervical and intracranial views.  It should be noted that prior to advancing the Emboshield with the shuttle sheath in proper location AP, lateral, cervical and intracranial views were obtained as well.   Having demonstrated patency of the stented segment, the sheath was then pulled back into the external iliac.  Oblique view was obtained.  A StarClose device deployed and there were no immediate complications.   INTERPRETATION:  The arch is imaged with contrast.  It is a type III arch of bovine nature.  The innominate and common carotid arteries are calcified, but there are no hemodynamically significant lesions.  The external carotid artery is widely patent.  The internal carotid artery and bulb are densely calcified and there is a greater than 80% stenosis.  Above the stenosis the internal carotid artery is patent.  The siphon is patent and the intracranial views are unremarkable.  Following angioplasty and stent placement with postdilatation to 5 mm, there is wide patency of the internal carotid artery.  The patient remained neurologically intact and had no changes during the procedure.    ____________________________ Katha Cabal, MD ggs:ea D: 02/17/2014 19:59:09 ET T: 02/18/2014 06:31:39 ET JOB#: [id number]  cc: Katha Cabal, MD, <Dictator> Katha Cabal, MD Katha Cabal MD ELECTRONICALLY SIGNED 03/08/2014 15:19

## 2014-12-24 NOTE — Discharge Summary (Signed)
PATIENT NAME:  Kenneth Vazquez, Kenneth Vazquez MR#:  [id number] DATE OF BIRTH:  [birth year]  DATE OF ADMISSION:  12/16/2013 DATE OF DISCHARGE:  12/17/2013  For a detailed note, please take a look at the history and physical done on admission by Dr. Darvin Neighbours.   DIAGNOSES AT DISCHARGE:  Is as follows:  1.  Syncope, likely vasovagal.  2.  Acute cerebrovascular accident.  3.  History of rheumatoid arthritis.  4.  Hyperlipidemia.  5.  Hypertension.   DIET:  The patient is being discharged on a low-sodium, low-fat diet.   ACTIVITY:  As tolerated.   FOLLOW-UP:  With Dr. Delana Meyer from vascular surgery in the next 1 to 2 weeks.  Also follow up with Dr. Neoma Laming from cardiology in the next 1 to 2 weeks.  The patient will also follow up with his primary care physician in the next 1 to 2 weeks at [facility].    CONSULTANTS DURING THE HOSPITAL COURSE:  Dr. Jayme Cloud, neurology, Dr. Neoma Laming, cardiology.   PERTINENT STUDIES DONE DURING THE HOSPITAL COURSE:  Is as follows:  A CT scan of the head done without contrast on admission showing no acute intracranial findings.  A CT angiography of the neck showing 70% stenosis in the right RCA.  An MRI of the brain done without contrast showing a small acute infarct in the left cerebellum.  No mass effect or hemorrhage.  An ultrasound of the carotids showing thrombus superimposed on atherosclerotic plaque in the proximal right internal carotid artery just beyond its origin, recommend follow-up with CTA.   HOSPITAL COURSE:  This is a 74 year old male who presented to the hospital with a suspected syncopal episode and also incidentally noted to have an acute stroke.  1.  Syncope.  The exact source of the syncope is unclear, but suspected to be either vasovagal or cardiac in origin.  The patient was noted to be orthostatic, but he was asymptomatic.  The patient was noted to have a cerebellar stroke, but unlikely that was the cause of the patient's syncope.  The patient  was ambulated, observed on telemetry, had no evidence of any acute arrhythmias.  His cardiac markers x 3 were negative.  His echocardiogram did not show any evidence of any thrombus or any LV dysfunction.  At this point, the patient is being discharged home with short follow-up with cardiology.  He would likely benefit from a loop monitor as an outpatient.  2.  Acute CVA.  This was incidentally noted on an MRI of the brain, although the patient is clinically asymptomatic, has no evidence of any balance problems.  He will continue aspirin and statin for now, as he was not taking his aspirin regularly, therefore he is not switched over to Plavix.   A neurology consult was obtained.  The patient was seen by Dr. Jayme Cloud who agreed with this management.  He recommended following up with neurology as an outpatient in the next three months.  The patient was also seen by physical therapy, did not require any inpatient rehab services.  He was recommended for outpatient rehab which was arranged for him prior to discharge.  3.  Hypertension.  The patient remained hemodynamically stable.  He will continue his enalapril and HCTZ.  4.  History of rheumatoid arthritis.  The patient will continue his methotrexate and Enbrel as stated.   The patient is being discharged home.   TIME SPENT:  40 minutes.    ____________________________ Belia Heman. Verdell Carmine, MD  vjs:ea D: 12/17/2013 16:55:02 ET T: 12/18/2013 00:36:58 ET JOB#: [id number]  cc: Belia Heman. Verdell Carmine, MD, <Dictator> Dionisio David, MD Katha Cabal, MD [facility]  Henreitta Leber MD ELECTRONICALLY SIGNED 12/27/2013 10:44

## 2014-12-24 NOTE — Consult Note (Signed)
PATIENT NAME:  Kenneth Vazquez, GILDERSLEEVE MR#:  [id number] DATE OF BIRTH:  03/14/1941  DATE OF CONSULTATION:  02/17/2014  REFERRING PHYSICIAN:   CONSULTING PHYSICIAN:  Dionisio David, MD  INDICATION FOR CONSULTATION: Bradycardia.   HISTORY OF PRESENT ILLNESS: This is a 74 year old African American male with a history of hypotension, sinus bradycardia, carotid disease, and TIA who had a lacunar infarct last week after feeling dizzy, weak, and lightheaded along with aphasia. He was sent home with follow-up stenting of the carotids. He had the stenting of the carotids done today by Dr. Lucky Cowboy. It went very well. He was initially admitted for carotid endarterectomy and was changed to carotid stenting because of TIA. The carotid stenting was done on the right side. He denies any chest pain, palpitations, or dizziness.  PHYSICAL EXAMINATION: VITAL SIGNS: His heart rate was 40, blood pressure was 130/70, respirations 14.  HEENT: No JVD.  LUNGS: Clear.  HEART: Regular rate and rhythm. Normal S1, S2. No audible murmur.  ABDOMEN: Soft, nontender, positive bowel sounds.  EXTREMITIES: No pedal edema.   LABORATORY DATA:  EKG is pending. Cardiac enzymes are pending.  ASSESSMENT: I agree with starting the patient on dopamine 3 mcg. Patient is having sinus bradycardia without any symptoms. No need to put temporary pacemaker at this time.   Thank you very much for the referral.    ____________________________ Dionisio David, MD sak:ts D: 02/17/2014 13:40:08 ET T: 02/17/2014 14:16:03 ET JOB#: [id number]  cc: Dionisio David, MD, <Dictator> Dionisio David MD ELECTRONICALLY SIGNED 03/16/2014 9:21

## 2014-12-24 NOTE — Discharge Summary (Signed)
PATIENT NAME:  [name], [name] MR#:  [id number] DATE OF BIRTH:  1940-10-11  DATE OF ADMISSION:  06/10/2014 DATE OF DISCHARGE:  06/11/2014  PRESENTING COMPLAINT: Dysarthria and confusion.   DISCHARGE DIAGNOSES:  1.  Suspected transient ischemic attack.  2.  Hypertension.  3.  History of cerebrovascular accident.  4.  History of right carotid artery stenosis with stent placement.   CONDITION ON DISCHARGE: Fair.   MEDICATIONS:  1.  Vitamin D3 at 400 international units 1 tablet p.o. b.i.d.  2.  Enbrel 1 mL subcutaneous once a week on Wednesday.  3.  Methotrexate 2.5 mg 8 tablets p.o. daily once a week on Friday.  4.  Centrum Silver 1 tablet p.o. daily.  5.  Vitamin with minerals p.o. daily.  6.  Aspirin 81 mg daily.  7.  Polyethylene glycol oral powder 17 grams p.o. daily.  8.  Diphenhydramine 50 mg 1 capsule once a day at bedtime as needed.  9.  Tylenol 500 mg 2 tablets every 6 hours as needed.  10.  Simvastatin 20 mg 1 tablet at bedtime.  11.  Plavix 75 mg daily.  12.  EpiPen as needed.   DIET: Low sodium, low fat/cholesterol diet.   FOLLOWUP: With your new PCP in [place]: CT of the head: No evidence of acute cerebrovascular accident. Cardiac enzymes x 3 remain negative.   BRIEF SUMMARY OF HOSPITAL COURSE: Kenneth Vazquez is a pleasant 74 year old African American gentleman with past medical history of stroke, hypertension, comes in with dysarthria and confusion. He was admitted with suspected TIA. His symptoms were noticed by his wife when they were at a gas station and they resolved, no recurrence of symptoms. Rest of the neurological checks were stable. Continued on aspirin and Plavix. He recently had workup in summer of 2015 for his stroke and had right carotid stent placement. He had a previous echocardiogram, which showed EF of 50%. We held off on MRI, since the patient was doing well. The patient and wife were okay with it.  Hyperlipidemia.  Continue simvastatin.   Rheumatoid arthritis, on methotrexate and Enbrel.   Hospital stay otherwise remained stable. The patient remained a full code.   TIME SPENT: Forty minutes.    ____________________________ Hart Rochester Posey Pronto, MD sap:TT D: 06/12/2014 10:37:09 ET T: 06/12/2014 16:04:31 ET JOB#: [id number]  cc: Tukker Byrns A. Posey Pronto, MD, <Dictator> Ilda Basset MD ELECTRONICALLY SIGNED 06/20/2014 15:52

## 2014-12-24 NOTE — Consult Note (Signed)
PATIENT NAME:  Kenneth Vazquez, VANE MR#:  [id number] DATE OF BIRTH:  08-Jan-1941  DATE OF CONSULTATION:  12/15/2013  CONSULTING PHYSICIAN:  Dionisio David, MD  INDICATION FOR CONSULTATION: Syncope.   HISTORY OF PRESENT ILLNESS: This is a 74 year old African American male with a past medical history of hypertension, rheumatoid arthritis, who presented to the Emergency Room all of a sudden after passing out. According to the wife, he all of a sudden slumped out, started rolling his eyes and was diaphoretic, and had incontinence of urine and just passed out. His blood pressure was at that time 170. His heart rate was in the 50s. CT of the head shows small subacute or chronic ischemic changes in the left parietal lobe. He had occasional chest pain, but not before passing out. He described the chest pain as dull to pressure-type, associated with shortness of breath. He denies chest pain at this time.   PAST MEDICAL HISTORY: History of hypertension, rheumatoid arthritis, benign prostatic hypertrophy.   FAMILY HISTORY: Father had myocardial infarction.   SOCIAL HISTORY: Denies EtOH abuse or smoking.   PHYSICAL EXAMINATION: GENERAL: He is alert, oriented x3, in no acute distress.  VITALS: Stable.  NECK: No JVD.  LUNGS: Clear.  HEART: Regular rate and rhythm. Normal S1, S2. No audible murmur.  ABDOMEN: Soft, nontender, positive bowel sounds.  EXTREMITIES: No pedal edema.   DIAGNOSTIC DATA: EKG shows a normal sinus rhythm, left anterior block, LVH, first degree AV block, nonspecific ST-T changes.   ASSESSMENT AND PLAN: Syncope, most likely noncardiac. There are some nonspecific ST-T changes. Cardiac enzymes first set is negative. He has occasional atypical chest pain. May want to do outpatient Lexiscan Myoview, but I advise getting neurology consult and carotid Doppler first. Also get an echocardiogram.   Thank you very much for the referral.    ____________________________ Dionisio David,  MD sak:jcm D: 12/15/2013 15:50:12 ET T: 12/15/2013 15:59:42 ET JOB#: [id number]  cc: Dionisio David, MD, <Dictator> Dionisio David MD ELECTRONICALLY SIGNED 01/07/2014 11:05

## 2014-12-24 NOTE — H&P (Signed)
PATIENT NAME:  Kenneth Vazquez, Kenneth Vazquez MR#:  [id number] DATE OF BIRTH:  20-Dec-1940  DATE OF ADMISSION:  12/15/2013  PRIMARY CARE PHYSICIAN: At [facility].   CHIEF COMPLAINT: Syncope.   HISTORY OF PRESENTING ILLNESS: A 74 year old Caucasian male patient with history of hypertension, rheumatoid arthritis, presents to the Emergency Room after his wife noticed that the patient was not responsive for a brief while. The patient was sitting at the breakfast table, felt fine when he woke up. Felt very sweaty and after this his wife noticed that the patient was slumped onto the right side in the chair but had his eyes open, had glassy eyes. did not respond to questions but woke up after a minute. Did not fall, did not pass out, did not hit his head. The patient did walk to the bedroom normally and then the wife noticed that he had urinary incontinence during this episode. The patient cleaned up, then called EMS and arrived to the Emergency Room, presently feels well. Blood pressure has been elevated in the 170s, heart rate in the high 50s. Saturations have been normal. Cardiac enzymes are normal. CT of the head showed a possible small subacute or chronic ischemia of the left parietal lobe and the patient is being admitted to the hospitalist service.   The patient does not have any cardiac history. No syncope. No recent change in medications other than the methotrexate, a small dosage change by his dermatologist at [facility].   PAST MEDICAL HISTORY: 1.  Hypertension.  2.  Rheumatoid arthritis.  3.  Arthritis.  4.  Benign prostatic hypertrophy.   PAST SURGICAL HISTORY: Left knee replacement.   FAMILY HISTORY: CAD.    SOCIAL HISTORY: The patient does not smoke. No alcohol. No illicit drugs. Ambulates on his own, lives with his wife.   CODE STATUS: Full code.    REVIEW OF SYSTEMS:    CONSTITUTIONAL: No fever, fatigue, weakness.  EYES:  No blurred vision or pain.  ENT:  No tinnitus, ear pain, hearing loss.    RESPIRATORY:  No cough, wheeze, hemoptysis.  CARDIOVASCULAR: No chest pain, orthopnea, edema. GASTROINTESTINAL:  No nausea, vomiting, diarrhea, abdominal pain.  GENITOURINARY: No dysuria, hematuria, frequency. Had incontinence today with the syncope.  ENDOCRINE: No polyuria, nocturia, thyroid problems.  HEMATOLOGIC AND LYMPHATIC: No anemia, easy bruising, bleeding.  INTEGUMENTARY: No acne, rash, lesion.  MUSCULOSKELETAL: No back pain, arthritis.  NEUROLOGIC: No focal numbness, weakness, seizure.  PSYCHIATRIC:  No anxiety or depression.   HOME MEDICATIONS: 1.  Aspirin 81 mg daily.  2.  Centrum Silver 1 tablet daily.  3.  Enalapril 20 mg 2 times a day.  4.  Enbrel 50 mg/mL 1 mL subcutaneous once a week on Wednesday.  5.  Hydrochlorothiazide 25 mg daily.  6.  Methotrexate 2.5 mg 8 tablets once a week.  7.  Vitamin D3 400 international units oral 2 times a day.   ALLERGIES: SULFA MEDICATIONS.   PHYSICAL EXAMINATION: VITAL SIGNS: Temperature 97.6, heart rate of 58, respirations 18, blood pressure 143/83, saturating 97% on room air.  GENERAL: Moderately-built African American male patient lying in bed, seems comfortable, conversational, cooperative with exam.   PSYCHIATRIC:  Alert and oriented x 3. Mood and affect appropriate. Judgment intact.  HEENT: Atraumatic, normocephalic. Oral mucosa moist and pink. External ears and nose normal. No pallor. No icterus. Pupils bilaterally equal and reactive to light.  NECK: Supple. No pain or palpable lymph nodes. Trachea midline. No carotid bruit or JVD.  CARDIOVASCULAR: S1,  S2, without any murmurs, bradycardia. Peripheral pulses 2+. No edema.  RESPIRATORY: Normal work of breathing. Clear to auscultation on both sides.  GASTROINTESTINAL: Soft abdomen, nontender. Bowel sounds present. No visceromegaly  palpable.  GENITOURINARY: No CVA tenderness, bladder distention.  SKIN: Warm and dry. No petechiae, rash, ulcers.  MUSCULOSKELETAL: No joint  swelling, redness, effusion of the large joints. Normal muscle tone.  NEUROLOGIC: Motor strength 5/5 in upper and lower extremities. Sensation is intact all over.  LYMPHATIC: No cervical lymphadenopathy.   LABORATORY, DIAGNOSTIC AND RADIOLOGICAL DATA:  1.  Glucose 116, BUN 19, creatinine 1.19, sodium 137, potassium 3.8, chloride 107, GFR 60. AST 18, alkaline phosphatase and bilirubin normal. Troponin less than 0.02.  2.  WBC 5.2,  hemoglobin 11.6, platelets of 128.  3.  Urinalysis shows no bacteria.  4.  EKG shows normal sinus rhythm, rate of 68, diffuse T wave inversions in inferolateral leads. Has left anterior fascicular block, no arrhythmias.  5.  CT of the head shows mild chronic ischemic microvascular disease, small focus of probable subacute to chronic ischemic change of the left parietal lobe, chronic sinus inflammatory change.  6.  Chest x-ray, PA and lateral, shows no active disease.   ASSESSMENT AND PLAN: 1.  Syncope in a patient with EKG changes with diffuse T wave inversions. Also on CT scan there is concern for a subacute stroke. The patient does not have any focal neurological deficits at this time. His heart rate in the low range but high 50s, unlikely to have caused his syncope. Not on any rate control medications. We will get an echocardiogram, get 2 more sets of troponin, put him on a tele floor and consult cardiology for further input with the case. The patient's orthostatics have been positive in the Emergency Room but he did not change his position during this episode and has not felt dizzy walking around today. We will put him on IV fluids.  Check MRI and carotid dopplers. 2.  Hypertension. Continue home medications.  3.  Rheumatoid arthritis. Continue home medications.  4.  Deep venous thrombosis prophylaxis with Lovenox.  5.  CODE STATUS: Full code.   TIME SPENT TODAY ON THIS CASE: Was 10 minutes.   ____________________________ Leia Alf Boyd Litaker,  MD srs:cs D: 12/15/2013 14:43:09 ET T: 12/15/2013 15:08:07 ET JOB#: [id number]  cc: Alveta Heimlich R. Lashana Spang, MD, <Dictator> [facility] MD ELECTRONICALLY SIGNED 12/15/2013 17:53

## 2014-12-24 NOTE — Discharge Summary (Signed)
Dates of Admission and Diagnosis:  Date of Admission 10-Feb-2014   Date of Discharge 10-Feb-2014   Admitting Diagnosis TIA   Final Diagnosis Acute stroke Carotid artery atherosclerosis- need sx Htn Seizures.    Chief Complaint/History of Present Illness a pleasant 74 year old African American male who is actually here in same-day surgery. He was to have a right-sided carotid endarterectomy by vascular surgery, but before he could undergo the surgery this morning, he was noted to be with blurry vision and had some communication issues, which likely was aphasia, with the staff here. He stated that he wanted to verbalize and say how he was feeling but could not. The episode lasted about 30 minutes or so, and the patient is back to baseline. Of note, the patient's blood pressure initially was 85/52. Of note, he has been having labile blood pressure. He came in here in May and per the chart, it appears that he had a blank stare on his face, also likely had low blood pressure at that time. He was started on Keppra. He had followed up with his cardiologist and also vascular surgery. Per vascular surgery notes, on June 2 he also had low blood pressure in the office of 84/63. He has no chest pain, palpitations, or numbness or weakness currently. He is still in same-day surgery, and hospitalist services are contacted for further evaluation and management.   Allergies:  Sulfa drugs: Hives  Hepatic:  10-Jun-15 13:07   Bilirubin, Total 0.9  Alkaline Phosphatase 51 (45-117 NOTE: New Reference Range 07/23/13)  SGPT (ALT)  11  SGOT (AST) 22  Total Protein, Serum 6.5  Albumin, Serum  2.8  Cardiology:  10-Jun-15 19:12   Echo Doppler REASON FOR EXAM:     COMMENTS:     PROCEDURE: [place] - ECHO DOPPLER COMPLETE(TRANSTHOR)  - Feb 09 2014  7:12PM   RESULT: Echocardiogram Report  Patient Name:   Kenneth Vazquez Ager Date of Exam: 02/09/2014 Medical Rec #:  [id number]                Custom1: Date of Birth:   Apr 15, 1941            Height:       70.0 in Patient Age:    56 years              Weight:       187.0 lb Patient Gender: M                     BSA:          2.03 m??  Indications: CVA Sonographer:    Janalee Dane RCS Referring Phys: Vivien Presto  Summary:  1. Left ventricular ejection fraction, by visual estimation, is 55 to  60%.No thrombii seen.  2. Elevated mean left atrial pressure.  3. Impaired relaxation pattern of LV diastolic filling.  4. Moderately dilated left atrium.  5. Mildly dilated right atrium.  6. Mild mitral valve regurgitation.  7. Mild to moderate tricuspid regurgitation. 2D AND M-MODE MEASUREMENTS (normal ranges within parentheses): Left Ventricle:          Normal IVSd (2D):      1.05 cm (0.7-1.1) LVPWd (2D):     0.96 cm (0.7-1.1) Aorta/LA:                  Normal LVIDd (2D):     5.11 cm (3.4-5.7) Aortic Root (2D): 4.10 cm (2.4-3.7) LVIDs (2D):     3.54 cm  Left Atrium (2D): 4.50 cm (1.9-4.0) LV FS (2D): 30.7 %   (>25%) LV EF (2D):     58.0 %   (>50%)                                   Right Ventricle:                                   RVd (2D):        3.20 cm LV DIASTOLIC FUNCTION: MV Peak E: 0.76 m/s E/e' Ratio: 6.00 MV Peak A: 0.81 m/s DecelTime: 320 msec E/A Ratio: 0.95 SPECTRAL DOPPLER ANALYSIS (where applicable): Mitral Valve: MV P1/2 Time: 92.80 msec MV Area, PHT: 2.37 cm?? Aortic Insufficiency: AI Half-time:  535 msec AI Decel Rate: 2.20 m/s?? Tricuspid Valve and PA/RV Systolic Pressure: TR Max Velocity: 2.37 m/s RA  Pressure: 5 mmHg RVSP/PASP: 27.5 mmHg  PHYSICIAN INTERPRETATION: Left Ventricle: The left ventricular internal cavity size was normal. LV  posterior wall thickness was normal. Left ventricular ejection fraction,  by visual estimation, is 55 to 60%. Spectral Doppler shows impaired  relaxation pattern of LV diastolic filling. Elevated mean left atrial  pressure. Right Ventricle: The right ventricular size is mildly  enlarged. Left Atrium: The leftatrium is moderately dilated. Right Atrium: The right atrium is mildly dilated. Mitral Valve: Mild mitral valve regurgitation is seen. Tricuspid Valve: Mild to moderate tricuspid regurgitation is visualized.  The tricuspid regurgitant velocity is2.37 m/s, and with an assumed right  atrial pressure of 5 mmHg, the estimated right ventricular systolic  pressure is normal at 27.5 mmHg. Pulmonic Valve: Mild pulmonic valve regurgitation.  [place] MD Electronically signed by [id number] Neoma Laming MD Signature Date/Time: 02/10/2014/8:41:05 AM  *** Final ***  IMPRESSION: .   Verified By: Emmit Pomfret. Humphrey Rolls, M.D., MD  Routine Chem:  10-Jun-15 13:07   Glucose, Serum 83  BUN  22  Creatinine (comp)  1.34  Sodium, Serum 140  Potassium, Serum 4.1  Chloride, Serum  110  CO2, Serum 28  Calcium (Total), Serum  8.4  Anion Gap  2  Osmolality (calc) 282  eGFR (African American) >60  eGFR (Non-African American)  52 (eGFR values <43m/min/1.73 m2 may be an indication of chronic kidney disease (CKD). Calculated eGFR is useful in patients with stable renal function. The eGFR calculation will not be reliable in acutely ill patients when serum creatinine is changing rapidly. It is not useful in  patients on dialysis. The eGFR calculation may not be applicable to patients at the low and high extremes of body sizes, pregnant women, and vegetarians.)  Routine Coag:  10-Jun-15 13:07   Activated PTT (APTT) 25.7 (A HCT value >55% may artifactually increase the APTT. In one study, the increase was an average of 19%. Reference: "Effect on Routine and Special Coagulation Testing Values of Citrate Anticoagulant Adjustment in Patients with High HCT Values." American Journal of Clinical Pathology 2006;126:400-405.)  Routine Hem:  10-Jun-15 13:07   WBC (CBC) 4.3  RBC (CBC)  3.56  Hemoglobin (CBC)  11.0  Hematocrit (CBC)  33.1  Platelet Count (CBC)  106  MCV 93  MCH  31.1  MCHC 33.4  RDW  15.2  Neutrophil % 58.7  Lymphocyte % 23.9  Monocyte % 12.9  Eosinophil % 3.5  Basophil % 1.0  Neutrophil # 2.5  Lymphocyte # 1.0  Monocyte # 0.6  Eosinophil # 0.2  Basophil # 0.0 (Result(s) reported on 09 Feb 2014 at 01:20PM.)   PERTINENT RADIOLOGY STUDIES: MRI:    10-Jun-15 17:02, MRI Brain Without Contrast  MRI Brain Without Contrast   REASON FOR EXAM:    blurry vision, aphasia, eval for cva  COMMENTS:       PROCEDURE: MR  - MR BRAIN WO CONTRAST  - Feb 09 2014  5:02PM     CLINICAL DATA:  74 year old male with altered mental status and  hypotension. Visual and speech changes. Initial encounter.    EXAM:  MRI HEAD WITHOUT CONTRAST    TECHNIQUE:  Multiplanar, multiecho pulse sequences of the brain and surrounding  structures were obtained without intravenous contrast.  COMPARISON:  Head CT without contrast 01/21/2014. Brain MRI  12/16/2013.    FINDINGS:  6 mm focus of restricted diffusion in the right superior frontal  gyrus subcortical white matter (series 8, image 19 and series 10,  image 22). Nearby confluent cerebral white matter T2 and FLAIR  hyperintensity, also noted inthe left hemisphere to a lesser  extent. No other restricted diffusion identified. Major intracranial  vascular flow voids are stable, persistent left trigeminal artery  re- identified.    No midline shift, mass effect, evidence of mass lesion,  ventriculomegaly, extra-axial collection or acute intracranial  hemorrhage. Cervicomedullary junction and pituitary are within  normal limits. Negative visualized cervical spine.    Stable gray and white matter signal elsewhere, including small  chronic lacunar infarcts in both cerebellar hemispheres, an the  medial left thalamus.    Visible internal auditory structures appear normal. Mastoids are  clear. Decreased paranasal sinus mucosal thickening. Stable orbits  soft tissues. Visualized scalp soft tissues are within  normal  limits. Normal bone marrow signal.     IMPRESSION:  1. Small acute lacunar infarct right superior frontal gyrus  subcortical white matter. No mass effect or hemorrhage.  2. Otherwise stable underlying chronic ischemic disease.  Electronically Signed    By: Lars Pinks M.D.    On: 02/09/2014 17:13         Verified By: Gwenyth Bender. HALL, M.D.,   Pertinent Past History:  Pertinent Past History 1.  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  2.  Hypertension.  3.  Rheumatoid arthritis.  4.  Arthritis.  5.  BPH.   Hospital Course:  Hospital Course * Acute stroke- Right frontal lobe lacunar infarct   asa, statin    have right carotid artery blockage- require surgery.    vascular suggested to start on plavix and follow after 1 week to arrange for sx.   neuro consult done.  * possibel seizure- continued keppra  * Htn   permissive Htn- Running 150-160.   Condition on Discharge Stable   Code Status:  Code Status Full Code   DISCHARGE INSTRUCTIONS HOME MEDS:  Medication Reconciliation: Patient's Home Medications at Discharge:     Medication Instructions  vitamin d3 400 intl units oral tablet  1 tab(s) orally 2 times a day   enbrel 50 mg/ml subcutaneous solution  1 milliliter(s) subcutaneous once a week on Wednesday   methotrexate 2.5 mg oral tablet  8 tab(s) orally once a week on Friday   centrum silver therapeutic multiple vitamins with minerals oral tablet  1 tab(s) orally once a day   aspirin 81 mg oral tablet  1 tab(s) orally  once a day   folic acid 1 mg oral tablet  1 tab(s) orally once a day   polyethylene glycol 3350 - oral powder for reconstitution  17 gram(s) orally once a day   diphenhydramine 50 mg oral capsule  1 cap(s) orally once a day (at bedtime), As Needed   tylenol 500 mg oral tablet  2 tab(s) orally every 6 hours, As Needed   keppra 500 mg tablet  1 tab(s) orally  2 times a day x 30 days   simvastatin 20 mg oral tablet  1 tab(s) orally once a day (at bedtime)   plavix 75 mg oral tablet  1 tab(s) orally once a day    STOP TAKING THE FOLLOWING MEDICATION(S):    hydrochlorothiazide 25 mg oral tablet: 1 tab(s) orally once a day enalapril 20 mg oral tablet: 1 tab(s) orally 2 times a day isosorbide mononitrate extended release 30 mg oral tablet, extended release: 1 tab(s) orally once a day (in the morning) terazosin 1 mg oral capsule: 2 cap(s) orally once a day (at bedtime)  Physician's Instructions:  Diet Low Sodium  Low Fat, Low Cholesterol   Activity Limitations As tolerated   Return to Work Not Applicable   Time frame for Follow Up Appointment 1-2 weeks  Vascular surgery as out pt.   Time frame for Follow Up Appointment 4-6 weeks  neurology clinic [facility].   Other Comments Vascular sx as out pt.     Neoma Laming Ali(Ordered): [employer] , [address] [place], [place] [address], [place]   [employer] Kalpeshkumar(Consultant): [facility], [address] [place], [place] [address], [place]   Leotis Pain S(Consultant): [facility] [address], [place], [place] [address], [place] [phone]  Electronic Signatures: Vaughan Basta (MD)  (Signed 14-Jun-15 14:06)  Authored: ADMISSION DATE AND DIAGNOSIS, CHIEF COMPLAINT/HPI, Allergies, PERTINENT LABS, PERTINENT RADIOLOGY STUDIES, PERTINENT PAST HISTORY, [place], PATIENT INSTRUCTIONS, Follow Up Physician   Last Updated: 14-Jun-15 14:06 by Vaughan Basta (MD)

## 2014-12-24 NOTE — H&P (Signed)
PATIENT NAME:  Kenneth Vazquez, Kenneth Vazquez MR#:  [id number] DATE OF BIRTH:  22-Oct-1940  DATE OF ADMISSION:  02/09/2014  REFERRING PHYSICIAN: Dr. Hortencia Pilar.   PRIMARY CARE PHYSICIAN: At [facility].   CHIEF COMPLAINT: Aphasia, blurry vision.   HISTORY OF PRESENT ILLNESS: The patient is a pleasant 74 year old African American male who is actually here in same-day surgery. He was to have a right-sided carotid endarterectomy by vascular surgery, but before he could undergo the surgery this morning, he was noted to be with blurry vision and had some communication issues, which likely was aphasia, with the staff here. He stated that he wanted to verbalize and say how he was feeling but could not. The episode lasted about 30 minutes or so, and the patient is back to baseline. Of note, the patient's blood pressure initially was 85/52. Of note, he has been having labile blood pressure. He came in here in May and per the chart, it appears that he had a blank stare on his face, also likely had low blood pressure at that time. He was started on Keppra. He had followed up with his cardiologist and also vascular surgery. Per vascular surgery notes, on June 2 he also had low blood pressure in the office of 84/63. He has no chest pain, palpitations, or numbness or weakness currently. He is still in same-day surgery, and hospitalist services are contacted for further evaluation and management.   PAST MEDICAL HISTORY:  1.  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  2.  Hypertension.  3.  Rheumatoid arthritis.  4.  Arthritis.  5.  BPH.   PAST SURGICAL HISTORY: Left knee replacement.   FAMILY HISTORY: CAD.   ALLERGIES: SULFA.   OUTPATIENT MEDICATIONS: Aspirin 81 mg daily (which the patient took yesterday), Centrum Silver vitamins and minerals 1 tab daily, diphenhydramine 50 mg at bedtime as needed, enalapril 20 mg 2 times a  day (the patient took this today), Enbrel 50 mg/mL 1 mL once a week on Wednesdays, folic acid 1 mg once a day, hydrochlorothiazide 25 mg once a day (last took this yesterday), Imdur 30 mg extended-release once a day (took this today), Keppra 500 mg 2 times a day (has taken this today as well), methotrexate 2.5 mg 8 tabs once a week on Fridays, MiraLax 17 grams once a day, terazosin 1 mg 2 caps once a day at bedtime, Tylenol 1000 mg every 6 hours as needed, vitamin D3 at 400 international units 1 tab 2 times a day.   REVIEW OF SYSTEMS:    CONSTITUTIONAL: Has chronic fatigue. No current weakness or weight changes. No fevers.  EYES: Has no active blurry vision but did have blurry vision earlier in the morning. Has irregular retina on the right after some kind of accident multiple years ago.  ENT: No tinnitus or hearing loss.  RESPIRATORY: No cough, wheezing, shortness of breath or dyspnea on exertion.  CARDIOVASCULAR: No chest pain or palpitations. Has history of hypertension  GASTROINTESTINAL: No nausea, vomiting, diarrhea, abdominal pain, black or bloody stools. No tarry stools.  GENITOURINARY: Denies dysuria. Has some frequency. Has BPH.  ENDOCRINE: No polyuria. No nocturia. HEMATOLOGIC AND LYMPHATIC: No anemia or easy bruising.  SKIN: No rashes.  MUSCULOSKELETAL: Denies active arthritis. Has rheumatoid arthritis.  NEUROLOGIC: Has had syncope in the past. Had stroke in the past. Currently has no numbness or weakness. He did have some left  lateral thigh numbness while he was having those symptoms earlier in the day. Also, he was diagnosed with a seizure disorder in May, now is on Keppra.  PSYCHIATRIC: No anxiety or depression.   PHYSICAL EXAMINATION: VITAL SIGNS: Do not have a full set of vitals, but initial blood pressure was 85/52. Last blood pressure was 113/70. Respiratory rate while I was in the room was about 18 on room air.  GENERAL: The patient is a well-developed [place] American male  lying in bed, talking in full sentences.  HEENT: Normocephalic, atraumatic. Right pupil is irregular. Left reactive to light. Moist mucous membranes.  NECK: Supple. No thyroid tenderness. No cervical lymphadenopathy.  CARDIOVASCULAR: S1, S2. No significant murmurs appreciated.  LUNGS: Clear to auscultation and wheezing, rhonchi, or rales.  ABDOMEN: Soft, nontender, nondistended.  EXTREMITIES: No pitting edema.  NEUROLOGIC: Cranial nerves II through XII grossly intact. Strength is 5 out of 5 in all extremities. Sensation is intact to light touch. Speech is fluent.  PSYCHIATRIC: Awake, alert, oriented x 3.   LABORATORY DATA: As of yet, there is only a CBC showing hemoglobin of 11, platelets of 106, MCV 93. PT is 25.7.   ASSESSMENT AND PLAN: We have a 74 year old pleasant male who has a history of rheumatoid arthritis, benign prostatic hypertrophy, hypertension, who also came in middle of April, was discharged with vasovagal syncope and cerebellar stroke, who also came into the ER in May and diagnosed with possible seizures. Of note, he was in the same-day surgery for right carotid artery and likely had a transient ischemic attack in the setting of hypotension. Of note, on the day of surgery has taken  his enalapril and also Imdur. His pressure in the office was low about a week ago, and also initial blood pressures were low. At this point, we would hold all the blood pressure medications and send the patient for an MRI to evaluate for possibility of a stroke. Although a heparin drip has been started, I did discuss the case with neurology, who at this point is okay for [place] to stop the heparin drip. I have also run this by vascular, who agrees at this point. Would start aspirin, statin. Allow permissive hypertension and do frequent neuro checks. We will see what the MRI shows to see if he had a stroke, but at minimum he likely had a mini stroke. The patient of note has had low blood pressure documented on  several occasions. At this point, it is prudent to hold all blood pressure medications, and if the pressures are elevated to start them slowly. I would at this point admit him to telemetry, watch him for significant arrhythmias, obtain an EKG, cycle troponins, check a lipid profile. I will order some basic labs and given the low blood pressure, give him a small bolus and put him on standing normal saline at this point. Would also put vascular surgery consult as well as neurology consult. The case was discussed with both. In regards to the possible seizure from last month, would continue the [place]. Do not know if the patient actually did have seizures or not, but it could have been again a similar picture and presentation as today with low blood pressure in setting of a stenotic lesion causing some symptoms. We would defer to neurology and vascular surgery about best potential time for his carotid endarterectomy.  Stop the heparin drip.   TOTAL TIME SPENT: About 65 minutes.   CODE STATUS: The patient is full code.  ____________________________ Vivien Presto, MD sa:jcm D: 02/09/2014 14:12:56 ET T: 02/09/2014 15:26:27 ET JOB#: [id number]  cc: Vivien Presto, MD, <Dictator> Katha Cabal, MD Dionisio David, MD Vivien Presto MD ELECTRONICALLY SIGNED 03/01/2014 12:44

## 2014-12-24 NOTE — Consult Note (Signed)
PATIENT NAME:  Kenneth Vazquez, Kenneth Vazquez MR#:  [id number] DATE OF BIRTH:  1941/07/13  DATE OF CONSULTATION:  02/09/2014  CARDIOLOGY CONSULTATION  CONSULTING PHYSICIAN:  Dionisio David, MD  INDICATION FOR CONSULTATION:  TIA.  This is a 74 year old African American male with a past medical history of 75% stenosis of the right carotid and TIA presented to the hospital for right carotid endarterectomy by Dr. Delana Meyer. While he was in the recovery, he developed confusion. He had slurred speech. He was, at times, aphasic and blacked out.  After a few minutes to half an hour, most of his symptoms resolved and he is feeling much better. He denies any symptoms right now. He denies chest pain, shortness of breath, PND, orthopnea or dizziness, but he had dizziness earlier.   PAST MEDICAL HISTORY: History of hypertension, cerebral vascular disease, carotid disease, hyperlipidemia, coronary artery disease, hypercholesterolemia, hypertension, cancer, cerebrovascular accident.   SOCIAL HISTORY: Never smoked nor drinks.   MEDICATIONS: Pravastatin, methotrexate, folic acid, enalapril, isosorbide.   FAMILY HISTORY: History of myocardial infarction, hypertension.   PHYSICAL EXAMINATION: GENERAL: His blood pressure 138/70, pulse right now is 46, respirations 18. He is afebrile.  HEENT: Revealed positive carotid bruit on the left side.  HEART: Regular rate and rhythm, bradycardic. Normal S1, S2. No audible murmur.  ABDOMEN: Soft, nontender, positive bowel sounds.  EXTREMITIES: No pedal edema.  EKG shows sinus bradycardia 47 beats per minute, first-degree AV block, nonspecific ST and T changes, LVH.   LABORATORY DATA: His creatinine is 1.34.   ASSESSMENT AND PLAN: The patient has transient ischemic attack. He had a transient before. He had another episode while he was in preadmission prior to surgery. Apparently, it has resolved completely now. He is going to have an MRI just to make sure there is no evidence of  cerebrovascular accident. He will be seen by neurology. He will be given IV fluid. He will be continued on aspirin. The rest of his medications will be stopped because his blood pressure was around 80 systolic and heart rate in the 30s when he came. He is not taking any beta blockers, but he was on antihypertensives, such as enalapril and isosorbide, which will be held. His EKG has no acute changes. He denies any chest pain. I advise if the MRI is negative proceed with surgery. Hopefully, the heart rate by tomorrow will resolve.  Thank you very much for referral.   ____________________________ Dionisio David, MD sak:ce D: 02/09/2014 15:56:09 ET T: 02/09/2014 16:16:15 ET JOB#: [id number]  cc: Dionisio David, MD, <Dictator> Dionisio David MD ELECTRONICALLY SIGNED 03/16/2014 9:19

## 2014-12-24 NOTE — Consult Note (Signed)
Referring Physician:  Alba Destine :   Reason for Consult: Admit Date: 17-Dec-2013  Chief Complaint: syncope  Reason for Consult: syncope   History of Present Illness: History of Present Illness:   74 yo RHD M presents to [facility] after having a syncopal episode at the dinner table.  Pt remembers feeling sweaty and having mild nausea prior.  Pt denies chest pain or palpatations prior.  He was out for less than a minute and then immediately came back to his baseline.  There is a question of urinary incontinence but no tongue biting or convulsions.  Pt has never had an episode like this before.  ROS:  General denies complaints   HEENT no complaints   Lungs no complaints   Cardiac no complaints   GI no complaints   GU no complaints   Musculoskeletal no complaints   Extremities no complaints   Skin no complaints   Neuro no complaints   Endocrine no complaints   Psych no complaints   Past Medical/Surgical Hx:  rheumatoid arthritis:   HTN:   prostate:   cholecystectomy:   left knee nodule removal:   left knee replacement:   Past Medical/ Surgical Hx:  Past Medical History as above   Past Surgical History as above   Home Medications: Medication Instructions Last Modified Date/Time  pravastatin 20 mg oral tablet 1 tab(s) orally once a day (at bedtime) 17-Apr-15 12:23  Aspir 81 oral delayed release tablet 1 tab(s) orally once a day 17-Apr-15 12:23  Vitamin D3 400 intl units oral tablet 1 tab(s) orally 2 times a day 17-Apr-15 12:23  Enbrel 50 mg/mL subcutaneous solution 1 milliliter(s) subcutaneous once a week on Wednesday 17-Apr-15 12:23  hydrochlorothiazide 25 mg oral tablet 1 tab(s) orally once a day 17-Apr-15 12:23  enalapril 20 mg oral tablet 1 tab(s) orally 2 times a day 17-Apr-15 12:23  methotrexate 2.5 mg oral tablet 8 tab(s) orally once a week on Friday 17-Apr-15 12:23  Centrum Silver Therapeutic Multiple Vitamins with Minerals oral tablet 1  tab(s) orally once a day 17-Apr-15 12:23   Allergies:  Sulfa drugs: Hives  Social/Family History: Employment Status: retired  Lives With: spouse  Living Arrangements: house  Social History: no tob, no EtOH, no illicits  Family History: no CAD, no stroke   Vital Signs: **Vital Signs.:   17-Apr-15 11:52  Vital Signs Type Routine  Temperature Temperature (F) 98.1  Celsius 36.7  Pulse Pulse 68  Respirations Respirations 18  Systolic BP Systolic BP 081  Diastolic BP (mmHg) Diastolic BP (mmHg) 84  Mean BP 110  Pulse Ox % Pulse Ox % 94  Pulse Ox Activity Level  At rest  Oxygen Delivery Room Air/ 21 %   Physical Exam: General: nl weight, NAD  HEENT: normocephalic, sclera nonicteric, oropharynx clear  Neck: supple, no JVD, no bruits  Chest: CTA B, no wheezing, good movement  Cardiac: RRR, no murmurs, no edema, 2+ pulses  Extremities: no C/C/E, FROM   Neurologic Exam: Mental Status: alert and oriented x 3, normal speech and language, follows complex commands  Cranial Nerves: PERRLA, EOMI, nl VF, face symmetric, tongue midline, shoulder shrug equal  Motor Exam: 5/5 B normal, tone, no tremor  Deep Tendon Reflexes: 2+/4 B, plantars downgoing B, no Hoffman  Sensory Exam: pinprick, temperature, and vibration intact B  Coordination: FTN and HTS WNL, nl RAM, nl gait   Lab Results: LabObservation:  16-Apr-15 08:35   OBSERVATION Reason for Test  Hepatic:  15-Apr-15 11:35  Bilirubin, Total 0.6  Alkaline Phosphatase 56 (45-117 NOTE: New Reference Range 07/23/13)  SGPT (ALT) 16  SGOT (AST) 24  Total Protein, Serum 7.4  Albumin, Serum  2.9  Routine Chem:  15-Apr-15 11:35   Glucose, Serum  116  BUN  19  Creatinine (comp) 1.19  Sodium, Serum 137  Potassium, Serum 3.8  Chloride, Serum 107  CO2, Serum 26  Calcium (Total), Serum  8.3  Osmolality (calc) 277  eGFR (African American) >60  eGFR (Non-African American)  60 (eGFR values <11m/min/1.73 m2 may be an indication of  chronic kidney disease (CKD). Calculated eGFR is useful in patients with stable renal function. The eGFR calculation will not be reliable in acutely ill patients when serum creatinine is changing rapidly. It is not useful in  patients on dialysis. The eGFR calculation may not be applicable to patients at the low and high extremes of body sizes, pregnant women, and vegetarians.)  Anion Gap  4  Cardiac:  15-Apr-15 21:09   Troponin I < 0.02 (0.00-0.05 0.05 ng/mL or less: NEGATIVE  Repeat testing in 3-6 hrs  if clinically indicated. >0.05 ng/mL: POTENTIAL  MYOCARDIAL INJURY. Repeat  testing in 3-6 hrs if  clinically indicated. NOTE: An increase or decrease  of 30% or more on serial  testing suggests a  clinically important change)  Routine UA:  15-Apr-15 13:30   Color (UA) Yellow  Clarity (UA) Clear  Glucose (UA) Negative  Bilirubin (UA) Negative  Ketones (UA) Negative  Specific Gravity (UA) 1.017  Blood (UA) Negative  pH (UA) 5.0  Protein (UA) Negative  Nitrite (UA) Negative  Leukocyte Esterase (UA) Negative (Result(s) reported on 15 Dec 2013 at 01:58PM.)  RBC (UA) <1 /HPF  WBC (UA) 1 /HPF  Bacteria (UA) NONE SEEN  Epithelial Cells (UA) <1 /HPF  Mucous (UA) PRESENT  Hyaline Cast (UA) 4 /LPF (Result(s) reported on 15 Dec 2013 at 01:58PM.)  Routine Hem:  15-Apr-15 11:35   WBC (CBC) 5.2  RBC (CBC)  3.77  Hemoglobin (CBC)  11.6  Hematocrit (CBC)  34.7  Platelet Count (CBC)  128 (Result(s) reported on 15 Dec 2013 at 11:57AM.)  MCV 92  MCH 30.9  MCHC 33.5  RDW  14.8   Radiology Results: [place]    16-Apr-15 10:53, [place]ilateral  [place] Doppler Bilateral   REASON FOR EXAM:    cva  COMMENTS:       PROCEDURE: [place] - [place] DOPPLER BILATERAL  - Dec 16 2013 10:53AM     CLINICAL DATA:  Stroke.    EXAM:  BILATERAL CAROTID DUPLEX ULTRASOUND    TECHNIQUE:  GPearline Cablesscale imaging, color Doppler and duplex ultrasound were  performed of bilateral carotid  and vertebral arteries in the neck.    COMPARISON:  12/16/2013  FINDINGS:  Criteria: Quantification of carotid stenosis is based on velocity  parameters that correlate the residual internal carotid diameter  with NASCET-based stenosis levels, using the diameter of the distal  internal carotid lumen as the denominator for stenosis measurement.    The following velocity measurements were obtained:    RIGHT    ICA:  212 cm/sec    CCA:  80 none cm/sec    SYSTOLIC ICA/CCA RATIO:  28.36 DIASTOLIC ICA/CCA RATIO:  46.29   ECA:  38 cm/sec    LEFT    ICA:  89 cm/sec    CCA:  1476cm/sec    SYSTOLIC ICA/CCA RATIO:  05.46  DIASTOLIC ICA/CCA RATIO:  7.67    ECA:  106 cm/sec  RIGHT CAROTID ARTERY: Heterogeneous plaque is noted in the proximal  right internal carotid artery. There is what appears to be thrombus  that either occludes were causes a critical stenosis just beyond the  origin of the right internal carotid artery. Some color Doppler flow  was seen distal to this which may be reconstituted or reflect  follow-through a critical stenosis.    RIGHT VERTEBRAL ARTERY:  Antegrade flow with normal waveform.    LEFT CAROTID ARTERY: Small amount of smooth plaque at the origin of  the left internal carotid artery without significant grayscale  narrowing.    LEFT VERTEBRAL ARTERY:  Antegrade flow with normal waveform.   IMPRESSION:  1. Thrombus superimposed on atherosclerotic plaque in the proximal  right internal carotid artery just beyond its origin causes either  occlusion or a critical stenosis. Recommend followup CTA to further  define whether this is an occlusion or critical stenosis.  2. No other evidence of a hemodynamically significant stenosis.      Electronically Signed    By: Lajean Manes M.D.    On: 12/16/2013 11:06         Verified By: Lasandra Beech, M.D.,  MRI:    16-Apr-15 09:45, MRI Brain Without Contrast  MRI Brain Without Contrast   REASON FOR  EXAM:    syncope. Acute CVA on CT?  COMMENTS:       PROCEDURE: MR  - MR BRAIN WO CONTRAST  - Dec 16 2013  9:45AM     CLINICAL DATA:  74 year old male with syncope at breakfast 1 day  ago. Age indeterminate ischemic changes on recent CT. Initial  encounter.    EXAM:  MRI HEAD WITHOUT CONTRAST    TECHNIQUE:  Multiplanar, multiecho pulse sequences of the brain and surrounding  structures were obtained without intravenous contrast.  COMPARISON:  CT head without contrast 12/15/2013.    FINDINGS:  Are preserved, with dominant distal right vertebral artery and  somewhat diminutive Associated T2 and FLAIR hyperintensity. No mass  effect or hemorrhage. Major intracranial vascular flow voids are  preserved, with dominant distal right vertebral artery but overall  diminutive vertebrobasilar system, related to a persistent left  trigeminal artery (rare normal anatomic variation).    No other restricted diffusion identified. There are several small  chronic lacunar infarcts in the right cerebellar hemisphere (series  9, image 8).    Patchy and confluent cerebral white matter T2 and FLAIR  hyperintensity corresponding to hypodensity on the recent  comparison. There is subcortical white matter involvement, and there  is a small chronic cortically based infarct in the left sensory  strip corresponding to the recent CT findings (series 10, image 21).  Deep gray matter nuclei within normal limits. Mild nonspecific T2  hyperintensity in the brainstem.    No midline shift, mass effect, evidence of mass lesion,  ventriculomegaly, extra-axial collection or acute intracranial  hemorrhage. Cervicomedullary junction and pituitary are within  normal limits. Negative for age visualized cervical spine. Visible  internal auditory structures appear normal. Mastoids are clear. Mild  to moderate paranasal sinus mucosal thickening. Postoperative  changes to the right globe. Visualized bone marrow signal is  within  normal limits. Tortuous cervical ICAs, with a retropharyngeal  course. Visualizedscalp soft tissues are within normal limits.     IMPRESSION:  1. 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 No mass effect or  hemorrhage.  2. Major vascular  flow voids appear preserved. Persistent left  trigeminal artery, rare normal anatomic variation.  3. 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      Electronically Signed    By: Lars Pinks M.D.    On: 12/16/2013 10:05     Verified By: Gwenyth Bender. HALL, M.D.,  CT:    15-Apr-15 12:44, CT Head Without Contrast  CT Head Without Contrast   REASON FOR EXAM:    dizziness, altered mental status  COMMENTS:       PROCEDURE: CT  - CT HEAD WITHOUT CONTRAST  - Dec 15 2013 12:44PM     CLINICAL DATA:  Syncope, dizziness and loss of bladder control.    EXAM:  CT HEAD WITHOUT CONTRAST    TECHNIQUE:  Contiguous axial images were obtained from the base of the skull  through the vertex without intravenous contrast.    COMPARISON:  None.  FINDINGS:  Ventricles, cisterns and other CSF spaces are within normal. There  is evidence of chronic ischemicmicrovascular disease. There is a  small focus of gray-white low-attenuation over the high left  parietal lobe suggesting subacute to chronic ischemic change. There  is no mass, mass effect, shift of midline structures or acute  hemorrhage. There is opacification over the ethmoid sinus.     IMPRESSION:  No acute intracranial findings.    Mild chronic ischemic microvascular disease. Small focus of probable  subacute to chronic ischemic change over the high left parietal  lobe.  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      Electronically Signed    By: Marin Olp M.D.    On: 12/15/2013 13:07         Verified By: Pearletha Alfred, M.D.,   Radiology Impression: Radiology Impression: MRI personally  reviewed by me and shows a small L cerebellar infarct, old white matter changes   Impression/Recommendations: Recommendations:   prior notes reviewed by me reviewed by me d/w Dr. Darvin Neighbours   L cerebellar acute infarct-  asymptomatic and incidental finding, etiology could be cardioembolic or small vessel disease Syncope-  given that pt had diaphoresis prior this is most likely cardiogenic in nature pt to consistently take ASA 26m daily recommend a loop recorder for 60 days [facility] will sign off  Electronic Signatures: SJamison Neighbor(MD)  (Signed 17-Apr-15 13:02)  Authored: REFERRING PHYSICIAN, Consult, History of Present Illness, Review of Systems, PAST MEDICAL/SURGICAL HISTORY, HOME MEDICATIONS, ALLERGIES, Social/Family History, NURSING VITAL SIGNS, Physical Exam-, LAB RESULTS, RADIOLOGY RESULTS, Recommendations   Last Updated: 17-Apr-15 13:02 by SJamison Neighbor(MD)

## 2015-01-01 NOTE — Op Note (Signed)
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   PREOPERATIVE DIAGNOSIS: A 2:1 block, severe bradycardia with heart rate dropping to 27, with syncope.   POSTOPERATIVE DIAGNOSIS: A 2:1 block, severe bradycardia with heart rate dropping to 27, with syncope.   ATTENDING PHYSICIAN: Neoma Laming, MD  IMPLANTING PHYSICIAN: Cletis Athens, MD  ACCESSORY CLINICAL DATA:  Pacemaker model: Adapta ADDR01 made by [employer], serial [id number].  Atrial lead manufacturer, [employer]. Atrial lead model P9502850, atrial lead serial #[id number]. Atrial lead implanted 10/05/2014.  Ventricular lead [employer]. Ventricular lead model [id number]. Ventricular lead serial #[id number].  Ventricular lead implantation date, 10/05/2014.  Pacemaker mode DDR, rate adaptive. It is a bipolar pacemaker. Threshold on the right ventricle which was determined at 3:15 p.m., ventricular lead 0.2,  mA 0.5; on the atrial lead, 1.5, mA 3.5.   PROCEDURE NOTE:  The patient was taken to the operating room and left upper chest was prepared with Hibiclens. It was draped in a sterile fashion. Then 1% lidocaine was infiltrated under skin and subcutaneous tissue and an incision was made on the skin overlying the deltopectoral groove. Deltopectoral groove was dissected out. The cephalic vein was small, so it was left in the deltopectoral groove. Next, patient was put in Trendelenburg position, and a subclavian stick was made on the left side. Subclavian stick was entered on the first pass and then with the help of a pacemaker lead introduction system, ventricular lead was introduced into the right ventricular apex. A wire was left for the atrial lead. Ventricular lead was tied and secured and stimulation thresholds were found to be satisfactory. Next, atrial lead was positioned in the right atrial appendage  area. Stimulation thresholds were checked several times for stability and it was then tied to the pectoralis major muscle. Both leads were crossed into the pacemaker cavity and then it was connected with the pacemaker, making sure the atrial lead goes to the atrial channel and the ventricular lead goes into the ventricular channel. All connections were tightened securely. Subcutaneous tissues and skin were sutured separately. Pressure was applied to control the bleeding area. The patient was returned to the recovery room in a stable condition. Wife was notified about the prognosis of the patient.    ____________________________ Cletis Athens, MD jm:LT D: 10/05/2014 15:35:05 ET T: 10/05/2014 17:39:38 ET JOB#: [id number]  cc: Cletis Athens, MD, <Dictator> Cletis Athens MD ELECTRONICALLY SIGNED 11/21/2014 18:00

## 2015-02-07 ENCOUNTER — Encounter: Payer: Self-pay | Admitting: Emergency Medicine

## 2015-02-07 ENCOUNTER — Other Ambulatory Visit: Payer: Self-pay

## 2015-02-07 ENCOUNTER — Emergency Department
Admission: EM | Admit: 2015-02-07 | Discharge: 2015-02-07 | Disposition: A | Discharge status: Home / Self Care | Payer: Medicare (Managed Care) | Attending: Emergency Medicine | Admitting: Emergency Medicine

## 2015-02-07 DIAGNOSIS — I10 Essential (primary) hypertension: Secondary | ICD-10-CM | POA: Insufficient documentation

## 2015-02-07 DIAGNOSIS — Z79899 Other long term (current) drug therapy: Secondary | ICD-10-CM | POA: Insufficient documentation

## 2015-02-07 DIAGNOSIS — R55 Syncope and collapse: Secondary | ICD-10-CM | POA: Diagnosis present

## 2015-02-07 DIAGNOSIS — E860 Dehydration: Secondary | ICD-10-CM | POA: Diagnosis not present

## 2015-02-07 HISTORY — DX: Unspecified osteoarthritis, unspecified site: M19.90

## 2015-02-07 HISTORY — DX: Essential (primary) hypertension: I10

## 2015-02-07 LAB — CBC WITH DIFFERENTIAL/PLATELET
BASOS PCT: 1 %
Basophils Absolute: 0 10*3/uL (ref 0–0.1)
Eosinophils Absolute: 0.3 10*3/uL (ref 0–0.7)
Eosinophils Relative: 8 %
HEMATOCRIT: 31.2 % — AB (ref 40.0–52.0)
Hemoglobin: 10.5 g/dL — ABNORMAL LOW (ref 13.0–18.0)
Lymphocytes Relative: 24 %
Lymphs Abs: 0.9 10*3/uL — ABNORMAL LOW (ref 1.0–3.6)
MCH: 30.8 pg (ref 26.0–34.0)
MCHC: 33.6 g/dL (ref 32.0–36.0)
MCV: 91.6 fL (ref 80.0–100.0)
Monocytes Absolute: 0.3 10*3/uL (ref 0.2–1.0)
Monocytes Relative: 9 %
NEUTROS ABS: 2.1 10*3/uL (ref 1.4–6.5)
Neutrophils Relative %: 58 %
PLATELETS: 104 10*3/uL — AB (ref 150–440)
RBC: 3.4 MIL/uL — AB (ref 4.40–5.90)
RDW: 14.5 % (ref 11.5–14.5)
WBC: 3.6 10*3/uL — AB (ref 3.8–10.6)

## 2015-02-07 LAB — BASIC METABOLIC PANEL
ANION GAP: 4 — AB (ref 5–15)
BUN: 17 mg/dL (ref 6–20)
CALCIUM: 8.3 mg/dL — AB (ref 8.9–10.3)
CO2: 28 mmol/L (ref 22–32)
Chloride: 109 mmol/L (ref 101–111)
Creatinine, Ser: 1.21 mg/dL (ref 0.61–1.24)
GFR calc Af Amer: >60 mL/min (ref >60)
GFR calc non Af Amer: 57 mL/min — ABNORMAL LOW (ref >60)
Glucose, Bld: 113 mg/dL — ABNORMAL HIGH (ref 65–99)
Potassium: 3.6 mmol/L (ref 3.5–5.1)
SODIUM: 141 mmol/L (ref 135–145)

## 2015-02-07 MED ORDER — SODIUM CHLORIDE 0.9 % IV BOLUS (SEPSIS)
1000.0000 mL | Freq: Once | INTRAVENOUS | Status: AC
  Administered 2015-02-07: 1000 mL via INTRAVENOUS

## 2015-02-07 NOTE — Discharge Instructions (Signed)
Dehydration Dehydration is when you lose more fluids from the body than you take in. Vital organs such as the kidneys, brain, and heart cannot function without a proper amount of fluids and salt. Any loss of fluids from the body can cause dehydration.  Older adults are at a higher risk of dehydration than younger adults. As we age, our bodies are less able to conserve water and do not respond to temperature changes as well. Also, older adults do not become thirsty as easily or quickly. Because of this, older adults often do not realize they need to increase fluids to avoid dehydration.  CAUSES   Vomiting.  Diarrhea.  Excessive sweating.  Excessive urination.  Fever.  Certain medicines, such as blood pressure medicines called diuretics.  Poorly controlled blood sugars. SIGNS AND SYMPTOMS  Mild dehydration:  Thirst.  Dry lips.  Slightly dry mouth. Moderate dehydration:  Very dry mouth.  Sunken eyes.  Skin does not bounce back quickly when lightly pinched and released.  Dark urine and decreased urine production.  Decreased tear production.  Headache. Severe dehydration:  Very dry mouth.  Extreme thirst.  Rapid, weak pulse (more than 100 beats per minute at rest).  Cold hands and feet.  Not able to sweat in spite of heat.  Rapid breathing.  Blue lips.  Confusion and lethargy.  Difficulty being awakened.  Minimal urine production.  No tears. DIAGNOSIS  Your health care provider will diagnose dehydration based on your symptoms and your exam. Blood and urine tests will help confirm the diagnosis. The diagnostic evaluation should also identify the cause of dehydration. TREATMENT  Treatment of mild or moderate dehydration can often be done at home by increasing the amount of fluids that you drink. It is best to drink small amounts of fluid more often. Drinking too much at one time can make vomiting worse. Severe dehydration needs to be treated at the hospital.  You may be given IV fluids that contain water and electrolytes. HOME CARE INSTRUCTIONS   Ask your health care provider about specific rehydration instructions.  Drink enough fluids to keep your urine clear or pale yellow.  Drink small amounts frequently if you have nausea and vomiting.  Eat as you normally do.  Avoid:  Foods or drinks high in sugar.  Carbonated drinks.  Juice.  Extremely hot or cold fluids.  Drinks with caffeine.  Fatty, greasy foods.  Alcohol.  Tobacco.  Overeating.  Gelatin desserts.  Wash your hands well to avoid spreading bacteria and viruses.  Only take over-the-counter or prescription medicines for pain, discomfort, or fever as directed by your health care provider.  Ask your health care provider if you should continue all prescribed and over-the-counter medicines.  Keep all follow-up appointments with your health care provider. SEEK MEDICAL CARE IF:  You have abdominal pain, and it increases or stays in one area (localizes).  You have a rash, stiff neck, or severe headache.  You are irritable, sleepy, or difficult to awaken.  You are weak, dizzy, or extremely thirsty.  You have a fever. SEEK IMMEDIATE MEDICAL CARE IF:   You are unable to keep fluids down, or you get worse despite treatment.  You have frequent episodes of vomiting or diarrhea.  You have blood or green matter (bile) in your vomit.  You have blood in your stool, or your stool looks black and tarry.  You have not urinated in 6-8 hours, or you have only urinated a small amount of very dark urine.    You faint. MAKE SURE YOU:   Understand these instructions.  Will watch your condition.  Will get help right away if you are not doing well or get worse. Document Released: 11/09/2003 Document Revised: 08/24/2013 Document Reviewed: 04/26/2013 [employer] Patient Information 2015 [employer]. This information is not intended to replace advice given to you by your  health care provider. Make sure you discuss any questions you have with your health care provider.  

## 2015-02-07 NOTE — ED Provider Notes (Signed)
[facility] Emergency Department Provider Note  ____________________________________________  Time seen: 4:00 PM  I have reviewed the triage vital signs and the nursing notes.   HISTORY  Chief Complaint Loss of Consciousness    HPI Kenneth Vazquez is a 74 y.o. male is brought to the ED after a syncope episode at home. Wife reports that he does not drink much fluids and gets lightheaded and passes out often from dehydration. The patient reports that he urinates maybe 2 or 3 times a day and always dark and has a strong smell. This morning he did not eat breakfast and had not had any liquids to drink except for a small amount of water to take his morning medicines. He was otherwise feeling okay, but then as they were sitting at the lunch, he got lightheaded and passed out. EMS was called and when they arrived his blood pressure was okay supine, and then when he was sitting upright it dropped to 60/40. Now with IV fluids, the blood pressure is increased to 140/80.  The patient denies any preceding symptoms such as chest pain shortness of breath belly pain back pain headache. Numbness [place] or weakness or vision change.     Past Medical History  Diagnosis Date  . Hypertension   . Arthritis     There are no active problems to display for this patient.   Past Surgical History  Procedure Laterality Date  . Pacemaker insertion      Current Outpatient Rx  Name  Route  Sig  Dispense  Refill  . carvedilol (COREG) 12.5 MG tablet   Oral   Take 12.5 mg by mouth 2 (two) times daily.         . enalapril (VASOTEC) 2.5 MG tablet   Oral   Take 2.5 mg by mouth daily.         . folic acid (FOLVITE) 1 MG tablet   Oral   Take 1 mg by mouth daily.         . hydrochlorothiazide (MICROZIDE) 12.5 MG capsule   Oral   Take 12.5 mg by mouth daily.         Marland Kitchen levETIRAcetam (KEPPRA) 500 MG tablet   Oral   Take 500 mg by mouth 2 (two) times daily.         .  methotrexate (RHEUMATREX) 2.5 MG tablet   Oral   Take 20 mg by mouth once a week.         . simvastatin (ZOCOR) 20 MG tablet   Oral   Take 20 mg by mouth at bedtime.         Marland Kitchen terazosin (HYTRIN) 5 MG capsule   Oral   Take 5 mg by mouth daily.           Allergies Sulfa antibiotics  History reviewed. No pertinent family history.  Social History History  Substance Use Topics  . Smoking status: Never Smoker   . Smokeless tobacco: Not on file  . Alcohol Use: No    Review of Systems  Constitutional: No fever or chills. No weight changes Eyes:No blurry vision or double vision.  ENT: No sore throat. Cardiovascular: No chest pain. Respiratory: No dyspnea or cough. Gastrointestinal: Negative for abdominal pain, vomiting and diarrhea.  No BRBPR or melena. Genitourinary: Negative for dysuria, urinary retention, bloody urine, or difficulty urinating. Musculoskeletal: Negative for back pain. No joint swelling or pain. Skin: Negative for rash. Neurological: Negative for headaches, focal weakness or numbness.  Psychiatric:No anxiety or depression.   Endocrine:No hot/cold intolerance, changes in energy, or sleep difficulty.  10-point ROS otherwise negative.  ____________________________________________   PHYSICAL EXAM:  VITAL SIGNS: ED Triage Vitals  Enc Vitals Group     BP 02/07/15 1525 125/73 mmHg     Pulse Rate 02/07/15 1525 63     Resp 02/07/15 1525 14     Temp 02/07/15 1525 98.1 F (36.7 C)     Temp src --      SpO2 02/07/15 1519 98 %     Weight 02/07/15 1525 188 lb (85.276 kg)     Height 02/07/15 1525 5' 9"  (1.753 m)     Head Cir --      Peak Flow --      Pain Score --      Pain Loc --      Pain Edu? --      Excl. in [place]? --      Constitutional: Alert and oriented. Well appearing and in no distress. Eyes: No scleral icterus. No conjunctival pallor. PERRL. EOMI ENT   Head: Normocephalic and atraumatic.   Nose: No congestion/rhinnorhea. No  septal hematoma   Mouth/Throat: Dry mucous membranes, no pharyngeal erythema. No peritonsillar mass. No uvula shift.   Neck: No stridor. No SubQ emphysema. No meningismus. Hematological/Lymphatic/Immunilogical: No cervical lymphadenopathy. Cardiovascular: RRR. Normal and symmetric distal pulses are present in all extremities. No murmurs, rubs, or gallops. Respiratory: Normal respiratory effort without tachypnea nor retractions. Breath sounds are clear and equal bilaterally. No wheezes/rales/rhonchi. Gastrointestinal: Soft and nontender. No distention. There is no CVA tenderness.  No rebound, rigidity, or guarding. Genitourinary: deferred Musculoskeletal: Nontender with normal range of motion in all extremities. No joint effusions.  No lower extremity tenderness.  No edema. Neurologic:   Normal speech and language.  CN 2-10 normal. Motor grossly intact. No pronator drift.  Normal gait. No gross focal neurologic deficits are appreciated.  Skin:  Skin is warm, dry and intact. No rash noted.  No petechiae, purpura, or bullae. Poor skin turgor Psychiatric: Mood and affect are normal. Speech and behavior are normal. Patient exhibits appropriate insight and judgment.  ____________________________________________    LABS (pertinent positives/negatives) (all labs ordered are listed, but only abnormal results are displayed) Labs Reviewed  BASIC METABOLIC PANEL - Abnormal; Notable for the following:    Glucose, Bld 113 (*)    Calcium 8.3 (*)    GFR calc non Af Amer 57 (*)    Anion gap 4 (*)    All other components within normal limits  CBC WITH DIFFERENTIAL/PLATELET - Abnormal; Notable for the following:    WBC 3.6 (*)    RBC 3.40 (*)    Hemoglobin 10.5 (*)    HCT 31.2 (*)    Platelets 104 (*)    Lymphs Abs 0.9 (*)    All other components within normal limits   ____________________________________________   EKG  Interpreted by me Paced rhythm rate of 79. There is appropriate  discordance in all leads. No Sgarbossa criteria met.  ____________________________________________    RADIOLOGY    ____________________________________________   PROCEDURES  ____________________________________________   INITIAL IMPRESSION / ASSESSMENT AND PLAN / ED COURSE  Pertinent labs & imaging results that were available during my care of the patient were reviewed by me and considered in my medical decision making (see chart for details).  Patient presents with history and physical clearly consistent with dehydration. No symptoms or other findings to suggest that this is cardiac in  nature. No evidence of stroke intracranial hemorrhage sepsis pneumonia or urinary tract infection or other acute pathology. Patient is feeling much better after initial 1 L saline bolus that was started by EMS. We'll give him another liter due to still having dry mucosa and poor skin turgor after that first liter. I expect it'll be up be discharged home after that.  ____________________________________________   FINAL CLINICAL IMPRESSION(S) / ED DIAGNOSES  Final diagnoses:  Dehydration   syncope    Carrie Mew, MD 02/07/15 1734

## 2015-02-07 NOTE — ED Notes (Signed)
Pt via ems from home after suffering syncopal episode during lunch. Pt's wife told EMS that this happens periodically and that "he gets dehydrated easily." Pt states he did not hit his head and that he is not in any pain. He does not remember anything right before except eating lunch and then he woke up on the floor with EMS around him. EMS stated that his bp initially was 100/60 and decreased to 60/40 upon siting up. They started a bag of fluids and bp was 121/73 just before arriving at hospital. Pt bp 125/73 at triage.

## 2015-02-07 NOTE — ED Notes (Signed)
Pt discharged home after verbalizing understanding of discharge instructions; nad noted. 

## 2015-02-07 NOTE — ED Notes (Signed)
Unable to scan due to computer being locked up. Reported to [facility] at 1640

## 2015-02-07 NOTE — ED Notes (Signed)
Pt states he feels normal now. Denies pain, lightheadness, dizziness. Some PVCs noted on cardiac monitor

## 2016-11-27 ENCOUNTER — Inpatient Hospital Stay (HOSPITAL_COMMUNITY): Payer: Medicare Other

## 2016-11-27 ENCOUNTER — Inpatient Hospital Stay (HOSPITAL_COMMUNITY)
Admission: EM | Admit: 2016-11-27 | Discharge: 2016-11-29 | DRG: 101 | Disposition: A | Discharge status: Home / Self Care | Payer: Medicare Other | Attending: Internal Medicine | Admitting: Internal Medicine

## 2016-11-27 ENCOUNTER — Emergency Department (HOSPITAL_COMMUNITY): Payer: Medicare Other

## 2016-11-27 ENCOUNTER — Encounter (HOSPITAL_COMMUNITY): Payer: Self-pay | Admitting: *Deleted

## 2016-11-27 DIAGNOSIS — Z882 Allergy status to sulfonamides status: Secondary | ICD-10-CM | POA: Diagnosis not present

## 2016-11-27 DIAGNOSIS — R569 Unspecified convulsions: Secondary | ICD-10-CM | POA: Diagnosis present

## 2016-11-27 DIAGNOSIS — Z9119 Patient's noncompliance with other medical treatment and regimen: Secondary | ICD-10-CM | POA: Diagnosis not present

## 2016-11-27 DIAGNOSIS — E876 Hypokalemia: Secondary | ICD-10-CM | POA: Diagnosis present

## 2016-11-27 DIAGNOSIS — E785 Hyperlipidemia, unspecified: Secondary | ICD-10-CM | POA: Diagnosis present

## 2016-11-27 DIAGNOSIS — M545 Low back pain: Secondary | ICD-10-CM | POA: Diagnosis present

## 2016-11-27 DIAGNOSIS — M1990 Unspecified osteoarthritis, unspecified site: Secondary | ICD-10-CM | POA: Diagnosis present

## 2016-11-27 DIAGNOSIS — G40909 Epilepsy, unspecified, not intractable, without status epilepticus: Secondary | ICD-10-CM | POA: Diagnosis present

## 2016-11-27 DIAGNOSIS — G309 Alzheimer's disease, unspecified: Secondary | ICD-10-CM | POA: Diagnosis present

## 2016-11-27 DIAGNOSIS — Z7982 Long term (current) use of aspirin: Secondary | ICD-10-CM

## 2016-11-27 DIAGNOSIS — G8929 Other chronic pain: Secondary | ICD-10-CM | POA: Diagnosis present

## 2016-11-27 DIAGNOSIS — R55 Syncope and collapse: Secondary | ICD-10-CM

## 2016-11-27 DIAGNOSIS — Z9114 Patient's other noncompliance with medication regimen: Secondary | ICD-10-CM | POA: Diagnosis not present

## 2016-11-27 DIAGNOSIS — Z950 Presence of cardiac pacemaker: Secondary | ICD-10-CM | POA: Diagnosis not present

## 2016-11-27 DIAGNOSIS — F0280 Dementia in other diseases classified elsewhere without behavioral disturbance: Secondary | ICD-10-CM | POA: Diagnosis present

## 2016-11-27 DIAGNOSIS — Z8673 Personal history of transient ischemic attack (TIA), and cerebral infarction without residual deficits: Secondary | ICD-10-CM | POA: Diagnosis not present

## 2016-11-27 DIAGNOSIS — I10 Essential (primary) hypertension: Secondary | ICD-10-CM | POA: Diagnosis present

## 2016-11-27 HISTORY — DX: Unspecified convulsions: R56.9

## 2016-11-27 HISTORY — DX: Cerebral infarction, unspecified: I63.9

## 2016-11-27 HISTORY — DX: Presence of cardiac pacemaker: Z95.0

## 2016-11-27 LAB — COMPREHENSIVE METABOLIC PANEL
ALT: 10 U/L — AB (ref 17–63)
AST: 22 U/L (ref 15–41)
Albumin: 3.2 g/dL — ABNORMAL LOW (ref 3.5–5.0)
Alkaline Phosphatase: 45 U/L (ref 38–126)
Anion gap: 6 (ref 5–15)
BUN: 22 mg/dL — ABNORMAL HIGH (ref 6–20)
CO2: 27 mmol/L (ref 22–32)
CREATININE: 1.4 mg/dL — AB (ref 0.61–1.24)
Calcium: 8.5 mg/dL — ABNORMAL LOW (ref 8.9–10.3)
Chloride: 105 mmol/L (ref 101–111)
GFR calc Af Amer: 54 mL/min — ABNORMAL LOW (ref >60)
GFR calc non Af Amer: 47 mL/min — ABNORMAL LOW (ref >60)
GLUCOSE: 199 mg/dL — AB (ref 65–99)
Potassium: 3.7 mmol/L (ref 3.5–5.1)
Sodium: 138 mmol/L (ref 135–145)
Total Bilirubin: 0.7 mg/dL (ref 0.3–1.2)
Total Protein: 6.7 g/dL (ref 6.5–8.1)

## 2016-11-27 LAB — CBC WITH DIFFERENTIAL/PLATELET
BASOS PCT: 0 %
Basophils Absolute: 0 10*3/uL (ref 0.0–0.1)
EOS ABS: 0.1 10*3/uL (ref 0.0–0.7)
EOS PCT: 1 %
HCT: 32.8 % — ABNORMAL LOW (ref 39.0–52.0)
HEMOGLOBIN: 11.5 g/dL — AB (ref 13.0–17.0)
Lymphocytes Relative: 13 %
Lymphs Abs: 0.6 10*3/uL — ABNORMAL LOW (ref 0.7–4.0)
MCH: 31.4 pg (ref 26.0–34.0)
MCHC: 35.1 g/dL (ref 30.0–36.0)
MCV: 89.6 fL (ref 78.0–100.0)
MONOS PCT: 6 %
Monocytes Absolute: 0.3 10*3/uL (ref 0.1–1.0)
Neutro Abs: 3.9 10*3/uL (ref 1.7–7.7)
Neutrophils Relative %: 80 %
PLATELETS: 86 10*3/uL — AB (ref 150–400)
RBC: 3.66 MIL/uL — ABNORMAL LOW (ref 4.22–5.81)
RDW: 13.6 % (ref 11.5–15.5)
WBC: 4.9 10*3/uL (ref 4.0–10.5)

## 2016-11-27 LAB — TROPONIN I: TROPONIN I: 0.04 ng/mL — AB (ref <0.03)

## 2016-11-27 MED ORDER — LEVETIRACETAM IN NACL 1000 MG/100ML IV SOLN
1000.0000 mg | Freq: Two times a day (BID) | INTRAVENOUS | Status: DC
  Administered 2016-11-27: 1000 mg via INTRAVENOUS
  Filled 2016-11-27 (×3): qty 100

## 2016-11-27 MED ORDER — CARVEDILOL 12.5 MG PO TABS
12.5000 mg | ORAL_TABLET | Freq: Two times a day (BID) | ORAL | Status: DC
  Administered 2016-11-27 – 2016-11-29 (×4): 12.5 mg via ORAL
  Filled 2016-11-27 (×3): qty 1
  Filled 2016-11-27: qty 4

## 2016-11-27 MED ORDER — SIMVASTATIN 10 MG PO TABS
20.0000 mg | ORAL_TABLET | Freq: Every day | ORAL | Status: DC
  Administered 2016-11-27 – 2016-11-28 (×2): 20 mg via ORAL
  Filled 2016-11-27 (×2): qty 2

## 2016-11-27 MED ORDER — TRAZODONE HCL 50 MG PO TABS
50.0000 mg | ORAL_TABLET | Freq: Every day | ORAL | Status: DC
  Administered 2016-11-27 – 2016-11-28 (×2): 50 mg via ORAL
  Filled 2016-11-27 (×2): qty 1

## 2016-11-27 MED ORDER — ASPIRIN EC 81 MG PO TBEC
81.0000 mg | DELAYED_RELEASE_TABLET | Freq: Every day | ORAL | Status: DC
  Administered 2016-11-28 – 2016-11-29 (×2): 81 mg via ORAL
  Filled 2016-11-27 (×2): qty 1

## 2016-11-27 MED ORDER — DONEPEZIL HCL 5 MG PO TABS: 5.0000 mg | ORAL_TABLET | Freq: Every morning | ORAL | Status: DC

## 2016-11-27 MED ORDER — ACETAMINOPHEN 650 MG RE SUPP: 650.0000 mg | Freq: Four times a day (QID) | RECTAL | Status: DC | PRN

## 2016-11-27 MED ORDER — ENOXAPARIN SODIUM 40 MG/0.4ML ~~LOC~~ SOLN
40.0000 mg | SUBCUTANEOUS | Status: DC
  Administered 2016-11-27 – 2016-11-28 (×2): 40 mg via SUBCUTANEOUS
  Filled 2016-11-27 (×2): qty 0.4

## 2016-11-27 MED ORDER — ACETAMINOPHEN 325 MG PO TABS
650.0000 mg | ORAL_TABLET | Freq: Four times a day (QID) | ORAL | Status: DC | PRN
  Administered 2016-11-27: 650 mg via ORAL
  Filled 2016-11-27: qty 2

## 2016-11-27 MED ORDER — TERAZOSIN HCL 1 MG PO CAPS
2.0000 mg | ORAL_CAPSULE | Freq: Every day | ORAL | Status: DC
  Administered 2016-11-27 – 2016-11-28 (×2): 2 mg via ORAL
  Filled 2016-11-27: qty 2
  Filled 2016-11-27: qty 1
  Filled 2016-11-27: qty 2
  Filled 2016-11-27: qty 1

## 2016-11-27 MED ORDER — HYDRALAZINE HCL 20 MG/ML IJ SOLN
10.0000 mg | Freq: Four times a day (QID) | INTRAMUSCULAR | Status: DC | PRN
  Administered 2016-11-27: 10 mg via INTRAVENOUS
  Filled 2016-11-27: qty 1

## 2016-11-27 MED ORDER — DONEPEZIL HCL 5 MG PO TABS
10.0000 mg | ORAL_TABLET | Freq: Every morning | ORAL | Status: DC
  Administered 2016-11-28 – 2016-11-29 (×2): 10 mg via ORAL
  Filled 2016-11-27 (×2): qty 2

## 2016-11-27 MED ORDER — SODIUM CHLORIDE 0.9 % IV BOLUS (SEPSIS)
500.0000 mL | Freq: Once | INTRAVENOUS | Status: AC
  Administered 2016-11-27: 500 mL via INTRAVENOUS

## 2016-11-27 MED ORDER — LEVETIRACETAM 500 MG PO TABS
750.0000 mg | ORAL_TABLET | Freq: Two times a day (BID) | ORAL | Status: DC
  Administered 2016-11-27 – 2016-11-29 (×4): 750 mg via ORAL
  Filled 2016-11-27 (×4): qty 1

## 2016-11-27 MED ORDER — MIRABEGRON ER 25 MG PO TB24
25.0000 mg | ORAL_TABLET | Freq: Every day | ORAL | Status: DC
  Administered 2016-11-28 – 2016-11-29 (×2): 25 mg via ORAL
  Filled 2016-11-27 (×2): qty 1

## 2016-11-27 NOTE — Consult Note (Addendum)
Rumson A. Merlene Laughter, MD     [email or web address]          CHER EGNOR is an 76 y.o. male.   ASSESSMENT/PLAN: 1. Acute encephalopathy thought to be due to post ictal lethargy and confusion status post breakthrough seizure. It appears that this is resolving. 2. Epilepsy with breakthrough seizures although there is some question that he may have missed a dose of his medication. However, I would recommend increasing the dose of his baseline Keppra to 750 mg twice a day. The patient can be discharged tomorrow and follow-up with his baseline primary neurologist. 3. Baseline Alzheimer's dementia for which recommend  increasing the Aricept to dose of 10 mg a day.      The patient is a 76 year old white male who probably had a seizure disorder morning. He was unresponsive afterwards. It appears that his baseline seizures are controlled per his neurologist notes as outlined below. He has been maintained on Keppra 500 mg twice a day. He may have missed a dose of his medication last evening. He does have a significant history of advanced Alzheimer's dementia. It appears that the patient's seizures are associated mostly with episodes of unresponsiveness. It is unclear if he has convulsive episodes. He has had abnormal EEG as outlined below. The patient review systems is limited given his advanced dementia. He does not report having any issues at this time other than chronic low back pain however.      GENERAL: The patient is sleeping in the bed but easily arousable.  HEENT: Normal  ABDOMEN: soft  EXTREMITIES: No edema   BACK: Normal  SKIN: Normal by inspection.    MENTAL STATUS: He is easily arousable and he is engaging. He does follow commands well and is pleasant. He is oriented to person only. He thinks he is at home and that is 2020. There is no dysarthria. Does follow commands briskly.   CRANIAL NERVES: Pupils approximately 7 mm on the right elliptical shaped  and nonreactive, the left is 4 mm and reactive ; extra ocular movements are full, there is no significant nystagmus; visual fields are full; upper and lower facial muscles are normal in strength and symmetric, there is no flattening of the nasolabial folds; tongue is midline; uvula is midline; shoulder elevation is normal.  MOTOR: exact strength is no obtainable but he does seem to have good strength throughout and good bulk and tone.   COORDINATION: Left finger to nose is normal, right finger to nose is normal, No rest tremor; no intention tremor; no postural tremor; no bradykinesia.  REFLEXES: Deep tendon reflexes are symmetrical and normal.   SENSATION: Normal to light touch and temperature.                  [[[[[[[[[[[[[[[[[[[[PRIMARY NEUROLOGY NOTE: Ray Church, MD - 09/24/2016 2:30 PM EST Formatting of this note may be different from the original. PCP: Dr. Marlynn Perking   Disease Summary:   Mr. Popp is a right handed 76 y.o. 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 This lasted for about a minute or so while she called his name to come to. Patient does not remember this spell but the wife did take him to [facility] and he stayed overnight.  On May 18th 2015 the patient was at home sitting at the bar drinking coffee when his wife walked by and realized that he was somewhat slumped over looking like he was in a daze and he was unresponsive. The wife feels like this time it took him a little bit longer to come too. She said after this time he seemed a little more disoriented afterward. She called EMT this time and they took him to the hospital and stayed overnight. At this time they started him Keppra 500 mg BID. 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 They scheduled him for a CEA on June 18th 2015 and why at the hospital the nurse witnessed a similar spell to what he had previously and he wasn't able to give them his name etc. They rescheduled his surgery and decided to place a stent. Patient has had MRI, CT Scan, Blood work and EEG (we did not find EEG report in hospital record) at this hospital and they stated that the patient had a stroke as well as seizures.   He also has a slight change in his speech when it comes to finding his words or fumbling for his words at tim. Patient feels that cognitively things take a little longer for him to process than before. Net stroke etiology work up Aspirin, statin  Seizure: he became unresponsive. His wife witnessed the event. He denies having any numbness, slurred speech, or weakness during event. Unresponsivemess lasted for for a brief time. He was taken to [facility] where they performed a CT scan on him.   EEG: 08/2014 - This is an abnormal EEG due to intermittent right sided slowing and infrequent, independent bi-frontal spike discharges. Frequent EKG abnormalities noted.  Ambulatory EEG 06/29/15 - Anterior/posterior temporal epileptiform discharges  Keppra (poor compliance due to forgetting to take medicine) Breakthrough seizure (05/2015) - with missing medication  Dementia - patient forgets what he is doing, what he has said. Some sundowning. This has come on gradually.   Interim History:  Mr. Berkovich is a 76 y.o. male here for follow up of Seizures . Since his last visit on 05/01/2016 with [place], patient states he has not had any new strokes or seizures that he is aware of. He continues to take Keppra 500 mg two times a day. His last seizure was in 05/2015. He states his memory is doing okay. Per wife he seems to think clearer than at his last visit. He takes Aricept 5 mg nightly.   Mr. Schnebly mentioned that he is taking his medications as  prescribed. ]]]]]]]]]]]]]]]]]]]]]]]]]]]]]]]]]]]]]]]]]]]]]  Blood pressure (!) 157/84, pulse 69, temperature 98.3 F (36.8 C), temperature source Oral, resp. rate 18, height 6' (1.829 m), weight 180 lb (81.6 kg), SpO2 96 %.  Past Medical History:  Diagnosis Date  . Arthritis   . Hypertension   . Pacemaker   . Seizures ([place])   . Stroke [facility])     Past Surgical History:  Procedure Laterality Date  . PACEMAKER INSERTION      No family history on file.  Social History:  reports that he has never smoked. He has never used smokeless tobacco. He reports that he does not drink alcohol. His drug history is not on file.  Allergies:  Allergies  Allergen Reactions  . Sulfa Antibiotics Hives    Medications: Prior to Admission medications   Medication Sig Start Date End Date Taking? Authorizing Provider  aspirin EC 81 MG tablet Take 81 mg by mouth daily.  Yes Historical Provider, MD  carvedilol (COREG) 12.5 MG tablet Take 12.5 mg by mouth 2 (two) times daily.   Yes Historical Provider, MD  donepezil (ARICEPT) 5 MG tablet Take 5 mg by mouth every morning.   Yes Historical Provider, MD  Ferrous Gluconate (IRON 27 PO) Take 1 tablet by mouth daily.   Yes Historical Provider, MD  furosemide (LASIX) 20 MG tablet Take 20 mg by mouth daily.   Yes Historical Provider, MD  levETIRAcetam (KEPPRA) 500 MG tablet Take 500 mg by mouth 2 (two) times daily.   Yes Historical Provider, MD  lisinopril (PRINIVIL,ZESTRIL) 2.5 MG tablet Take 2.5 mg by mouth daily. 11/13/16  Yes Historical Provider, MD  MYRBETRIQ 25 MG TB24 tablet Take 1 tablet by mouth daily. 11/13/16  Yes Historical Provider, MD  simvastatin (ZOCOR) 20 MG tablet Take 1 tablet by mouth at bedtime. 10/14/15  Yes Historical Provider, MD  terazosin (HYTRIN) 2 MG capsule Take 2 mg by mouth at bedtime.    Yes Historical Provider, MD  traZODone (DESYREL) 50 MG tablet Take 50-100 mg by mouth at bedtime.   Yes Historical Provider, MD    Scheduled  Meds: . aspirin EC  81 mg Oral Daily  . carvedilol  12.5 mg Oral BID  . donepezil  5 mg Oral q morning - 10a  . enoxaparin (LOVENOX) injection  40 mg Subcutaneous Q24H  . levETIRAcetam  1,000 mg Intravenous Q12H  . mirabegron ER  25 mg Oral Daily  . simvastatin  20 mg Oral QHS  . terazosin  2 mg Oral QHS  . traZODone  50 mg Oral QHS   Continuous Infusions: PRN Meds:.acetaminophen **OR** acetaminophen, hydrALAZINE     Results for orders placed or performed during the hospital encounter of 11/27/16 (from the past 48 hour(s))  Comprehensive metabolic panel     Status: Abnormal   Collection Time: 11/27/16 12:01 PM  Result Value Ref Range   Sodium 138 135 - 145 mmol/L   Potassium 3.7 3.5 - 5.1 mmol/L   Chloride 105 101 - 111 mmol/L   CO2 27 22 - 32 mmol/L   Glucose, Bld 199 (H) 65 - 99 mg/dL   BUN 22 (H) 6 - 20 mg/dL   Creatinine, Ser 1.40 (H) 0.61 - 1.24 mg/dL   Calcium 8.5 (L) 8.9 - 10.3 mg/dL   Total Protein 6.7 6.5 - 8.1 g/dL   Albumin 3.2 (L) 3.5 - 5.0 g/dL   AST 22 15 - 41 U/L   ALT 10 (L) 17 - 63 U/L   Alkaline Phosphatase 45 38 - 126 U/L   Total Bilirubin 0.7 0.3 - 1.2 mg/dL   GFR calc non Af Amer 47 (L) >60 mL/min   GFR calc Af Amer 54 (L) >60 mL/min    Comment: (NOTE) The eGFR has been calculated using the CKD EPI equation. This calculation has not been validated in all clinical situations. eGFR's persistently <60 mL/min signify possible Chronic Kidney Disease.    Anion gap 6 5 - 15  CBC with Differential     Status: Abnormal   Collection Time: 11/27/16 12:01 PM  Result Value Ref Range   WBC 4.9 4.0 - 10.5 K/uL   RBC 3.66 (L) 4.22 - 5.81 MIL/uL   Hemoglobin 11.5 (L) 13.0 - 17.0 g/dL   HCT 32.8 (L) 39.0 - 52.0 %   MCV 89.6 78.0 - 100.0 fL   MCH 31.4 26.0 - 34.0 pg   MCHC 35.1 30.0 - 36.0 g/dL  RDW 13.6 11.5 - 15.5 %   Platelets 86 (L) 150 - 400 K/uL    Comment: SPECIMEN CHECKED FOR CLOTS LARGE PLATELETS PRESENT PLATELET COUNT CONFIRMED BY SMEAR     Neutrophils Relative % 80 %   Neutro Abs 3.9 1.7 - 7.7 K/uL   Lymphocytes Relative 13 %   Lymphs Abs 0.6 (L) 0.7 - 4.0 K/uL   Monocytes Relative 6 %   Monocytes Absolute 0.3 0.1 - 1.0 K/uL   Eosinophils Relative 1 %   Eosinophils Absolute 0.1 0.0 - 0.7 K/uL   Basophils Relative 0 %   Basophils Absolute 0.0 0.0 - 0.1 K/uL  Troponin I     Status: Abnormal   Collection Time: 11/27/16  7:31 PM  Result Value Ref Range   Troponin I 0.04 (HH) <0.03 ng/mL    Comment: CRITICAL RESULT CALLED TO, READ BACK BY AND VERIFIED WITH: HANDY,T AT 2020 ON 3.28.2018 BY ISLEY,B     Studies/Results:     Keighley Deckman A. Merlene Laughter, M.D.  Diplomate, Tax adviser of Psychiatry and Neurology ( Neurology). 11/27/2016, 8:24 PM

## 2016-11-27 NOTE — ED Triage Notes (Signed)
Pt brought in by [facility]. Pt was at home and had "seizure like activity" while eating breakfast. Pt has hx of seizures. Per EMS pt was post ictal when they arrived, took pt about 10 minutes before he started responding to painful stimuli per EMS. CBG 153 for EMS. Pt did not encounter any fall or injury during the seizure.

## 2016-11-27 NOTE — Progress Notes (Signed)
Pt arrived on the floor. Seizure precautions in place, as well as call light. Vitals WNL, except BP elevated. MD notified. Will continue to monitor.

## 2016-11-27 NOTE — Progress Notes (Signed)
CRITICAL VALUE ALERT  Critical value received:  Troponin 0.04  Date of notification:  04/24/17  Time of notification:  2038  Critical value read back:Yes.    Nurse who received alert:  Ronne Binning  MD notified (1st page):  K.Kirby  Time of first page:  2025  MD notified (2nd page):  Time of second page:  Responding MD:    Time MD responded:

## 2016-11-27 NOTE — H&P (Signed)
History and Physical    Kenneth Vazquez VQM:[id number] DOB: 10-24-1940 DOA: 11/27/2016  PCP: Volanda Napoleon, MD  Patient coming from: Home.   I have personally briefly reviewed patient's old medical records in [place]  Chief Complaint: seizure episode this am.   HPI: Kenneth Vazquez is a 76 y.o. male with medical history significant of seizures, hypertension,  s/p PPM, h/o stroke, was brought in by EMS as he was found unresponsive by his wife. Most of the history was obtained from the wife at bedside. He was still lethargic, was oriented to place and person and not to time. As per his wife, the patient was eating breakfast, and wife left the room to talk to her brother and when she got back to her husband, he was found unresponsive at the table.  She tried to wake him up ,but couldn't, so she called her daughter across the street and they tried to wake him up and couldn't. So they called EMS. As per the wife pt missed a couple of keppra doses. There was no tongue biting. No urinary incontinence. He denies any complaints. On arrival to ED, his PPM was check ed and was found to have atrial tachycardia at the rate of 180/min. He was referred to medical service for further evaluation.  Review of Systems: pt is confused and detailed ROS could not be obtained.     Past Medical History:  Diagnosis Date  . Arthritis   . Hypertension   . Pacemaker   . Seizures ([place])   . Stroke [facility])     Past Surgical History:  Procedure Laterality Date  . PACEMAKER INSERTION       reports that he has never smoked. He has never used smokeless tobacco. He reports that he does not drink alcohol. His drug history is not on file.  Allergies  Allergen Reactions  . Sulfa Antibiotics Hives    No family history on file.   Prior to Admission medications   Medication Sig Start Date End Date Taking? Authorizing Provider  aspirin EC 81 MG tablet Take 81 mg by mouth daily.   Yes Historical  Provider, MD  carvedilol (COREG) 12.5 MG tablet Take 12.5 mg by mouth 2 (two) times daily.   Yes Historical Provider, MD  donepezil (ARICEPT) 5 MG tablet Take 5 mg by mouth every morning.   Yes Historical Provider, MD  Ferrous Gluconate (IRON 27 PO) Take 1 tablet by mouth daily.   Yes Historical Provider, MD  furosemide (LASIX) 20 MG tablet Take 20 mg by mouth daily.   Yes Historical Provider, MD  levETIRAcetam (KEPPRA) 500 MG tablet Take 500 mg by mouth 2 (two) times daily.   Yes Historical Provider, MD  lisinopril (PRINIVIL,ZESTRIL) 2.5 MG tablet Take 2.5 mg by mouth daily. 11/13/16  Yes Historical Provider, MD  MYRBETRIQ 25 MG TB24 tablet Take 1 tablet by mouth daily. 11/13/16  Yes Historical Provider, MD  simvastatin (ZOCOR) 20 MG tablet Take 1 tablet by mouth at bedtime. 10/14/15  Yes Historical Provider, MD  terazosin (HYTRIN) 2 MG capsule Take 2 mg by mouth at bedtime.    Yes Historical Provider, MD  traZODone (DESYREL) 50 MG tablet Take 50-100 mg by mouth at bedtime.   Yes Historical Provider, MD    Physical Exam: Vitals:   11/27/16 1230 11/27/16 1300 11/27/16 1330 11/27/16 1400  BP: (!) 150/91 (!) 145/84 (!) 152/88 140/84  Pulse:  62 71 62  Resp: 17 (!) 22 18  14  SpO2:  99% 100% 100%  Weight:      Height:        Constitutional: NAD, calm, comfortable Vitals:   11/27/16 1230 11/27/16 1300 11/27/16 1330 11/27/16 1400  BP: (!) 150/91 (!) 145/84 (!) 152/88 140/84  Pulse:  62 71 62  Resp: 17 (!) 22 18 14   SpO2:  99% 100% 100%  Weight:      Height:       Eyes: PERRL, lids and conjunctivae normal ENMT: Mucous membranes are moist. Posterior pharynx clear of any exudate or lesions.Normal dentition.  Neck: normal, supple, no masses, no thyromegaly Respiratory: clear to auscultation bilaterally, no wheezing, no crackles. Normal respiratory effort. No accessory muscle use.  Cardiovascular: Regular rate and rhythm, no murmurs / rubs / gallops. No extremity edema. 2+ pedal pulses. No  carotid bruits.  Abdomen: no tenderness, no masses palpated. No hepatosplenomegaly. Bowel sounds positive.  Musculoskeletal: no clubbing / cyanosis. No joint deformity upper and lower extremities. Good ROM, no contractures Skin: no rashes, lesions, ulcers. No induration Neurologic: alert afebrile, able to answer simple questions, able to answer simple questions. Psychiatric: confused.     Labs on Admission: I have personally reviewed following labs and imaging studies  CBC:  Recent Labs Lab 11/27/16 1201  WBC 4.9  NEUTROABS 3.9  HGB 11.5*  HCT 32.8*  MCV 89.6  PLT 86*   Basic Metabolic Panel:  Recent Labs Lab 11/27/16 1201  NA 138  K 3.7  CL 105  CO2 27  GLUCOSE 199*  BUN 22*  CREATININE 1.40*  CALCIUM 8.5*   GFR: Estimated Creatinine Clearance: 47.1 mL/min (A) (by C-G formula based on SCr of 1.4 mg/dL (H)). Liver Function Tests:  Recent Labs Lab 11/27/16 1201  AST 22  ALT 10*  ALKPHOS 45  BILITOT 0.7  PROT 6.7  ALBUMIN 3.2*   No results for input(s): LIPASE, AMYLASE in the last 168 hours. No results for input(s): AMMONIA in the last 168 hours. Coagulation Profile: No results for input(s): INR, PROTIME in the last 168 hours. Cardiac Enzymes: No results for input(s): CKTOTAL, CKMB, CKMBINDEX, TROPONINI in the last 168 hours. BNP (last 3 results) No results for input(s): PROBNP in the last 8760 hours. HbA1C: No results for input(s): HGBA1C in the last 72 hours. CBG: No results for input(s): GLUCAP in the last 168 hours. Lipid Profile: No results for input(s): CHOL, HDL, LDLCALC, TRIG, CHOLHDL, LDLDIRECT in the last 72 hours. Thyroid Function Tests: No results for input(s): TSH, T4TOTAL, FREET4, T3FREE, THYROIDAB in the last 72 hours. Anemia Panel: No results for input(s): VITAMINB12, FOLATE, FERRITIN, TIBC, IRON, RETICCTPCT in the last 72 hours. Urine analysis:    Component Value Date/Time   COLORURINE Amber 02/02/2014 1501   APPEARANCEUR Hazy  02/02/2014 1501   LABSPEC 1.023 02/02/2014 1501   PHURINE 5.0 02/02/2014 1501   GLUCOSEU Negative 02/02/2014 1501   HGBUR Negative 02/02/2014 1501   BILIRUBINUR Negative 02/02/2014 1501   KETONESUR Negative 02/02/2014 1501   PROTEINUR Negative 02/02/2014 1501   NITRITE Negative 02/02/2014 1501   LEUKOCYTESUR Negative 02/02/2014 1501    Radiological Exams on Admission: Dg Chest Portable 1 View  Result Date: 11/27/2016 CLINICAL DATA:  Cough.  Seizure-like activity. EXAM: PORTABLE CHEST 1 VIEW COMPARISON:  None. FINDINGS: Left pacer in place with leads in the right atrium and right ventricle. Heart is borderline in size. Lungs are clear. No effusions. No acute bony abnormality. IMPRESSION: Borderline heart size.  No active disease. Electronically  Signed   By: Rolm Baptise M.D.   On: 11/27/2016 11:59    EKG: Independently reviewed. Sinus / ectopic atrial rhythm.   Assessment/Plan Active Problems:   Seizures (HCC)  Seizures episode/ unresponsiveness: Admit to telemetry, started on IV keppra 1000 mg q12, plan to cut down the dose to 500 mg  BID from 3/29.  Neurology consulted for recommendations.  CT head will be ordered as he has a h/o of stroke, though no deficits can be appreciated.  Differential include arrhythmia, though his PPM was checked .  Monitor overnight on telemetry.  Check cardiac enzymes.  Check echocardiogram.  Resume aspirin.    Hyperlipidemia: check lipid panel.   Hypertension: suboptimally controlled.  Resume home meds and add prn hydralazine.       DVT prophylaxis: lovenox.  Code Status: full code.  Family Communication: wife at bedside.  Disposition Plan: pending further eval. Consults called: neurology. Admission status: inpatient. telemetry   Lynda Capistran MD [facility] Pager [phone]  If 7PM-7AM, please contact night-coverage [email or web address] Password [facility]  11/27/2016, 2:49 PM

## 2016-11-27 NOTE — ED Provider Notes (Signed)
Emergency Department Provider Note   I have reviewed the triage vital signs and the nursing notes.  By signing my name below, I, Ethelle Lyon Naia Ruff, attest that this documentation has been prepared under the direction and in the presence of Margette Fast, MD. Electronically Signed: Ethelle Lyon Davonne Jarnigan, Scribe. 11/27/2016. 11:27 AM.   HISTORY  Chief Complaint Seizures  LEVEL 5 CAVEAT DUE TO SYNCOPE/SEIZURE  HPI Comments:  Kenneth Vazquez is a 76 y.o. male with a PMHx of HTN and Arthritis with a PSHx of Pacemaker Insertion, who presents to the Emergency Department by way of ambulance complaining of a sudden onset seizure this morning. Per wife, she was on the phone with her brother when she found the pt, potentially in a postictal state, diaphoretic and unresponsive at the breakfast table this morning. Pt has a h/o seizures. His wife reports this appeared similar to his past seizures, without shaking, except today's episode lasted longer though she is unsure of the exact timing. Per EMS, pt was postictal when they arrived, took pt about 10 minutes before he started responding to painful stimuli. CBG 153 for EMS. Prior to this morning, pt was healthy and does pretty well taking his Keppra medication with one missed dose last night but did take Keppra this morning. No alleviating factors noted. Pt denies CP, abdominal pain, SOB, HA, and any other complaints at this time.  Neurologist: Dr. Vickii Penna  Past Medical History:  Diagnosis Date  . Arthritis   . Hypertension   . Pacemaker   . Seizures ([place])   . Stroke [facility])     Patient Active Problem List   Diagnosis Date Noted  . Seizures ([place]) 11/27/2016    Past Surgical History:  Procedure Laterality Date  . PACEMAKER INSERTION        Allergies Sulfa antibiotics  No family history on file.  Social History Social History  Substance Use Topics  . Smoking status: Never Smoker  . Smokeless tobacco: Never Used  . Alcohol use No      Review of Systems Unobtainable d/t Syncope/Seizure ____________________________________________   PHYSICAL EXAM:  VITAL SIGNS: Resp: 19 SpO2: 100% Pulse: 62 BP: 133/83  Constitutional: Drowsy but awakens to voice and gives a partial history. Oriented to person only.  Eyes: Conjunctivae are normal. PERRL.  Head: Atraumatic. Nose: No congestion/rhinnorhea. Mouth/Throat: Mucous membranes are moist.  Neck: No stridor.  Cardiovascular: Normal rate, regular rhythm. Good peripheral circulation. Grossly normal heart sounds.   Respiratory: Normal respiratory effort.  No retractions. Lungs CTAB. Gastrointestinal: Soft and nontender. No distention.  Musculoskeletal: No lower extremity tenderness nor edema. No gross deformities of extremities. Neurologic:  Normal speech and language. No gross focal neurologic deficits are appreciated.  Skin:  Skin is warm, dry and intact. No rash noted  ____________________________________________   LABS (all labs ordered are listed, but only abnormal results are displayed)  Labs Reviewed  COMPREHENSIVE METABOLIC PANEL - Abnormal; Notable for the following:       Result Value   Glucose, Bld 199 (*)    BUN 22 (*)    Creatinine, Ser 1.40 (*)    Calcium 8.5 (*)    Albumin 3.2 (*)    ALT 10 (*)    GFR calc non Af Amer 47 (*)    GFR calc Af Amer 54 (*)    All other components within normal limits  CBC WITH DIFFERENTIAL/PLATELET - Abnormal; Notable for the following:    RBC 3.66 (*)  Hemoglobin 11.5 (*)    HCT 32.8 (*)    Platelets 86 (*)    Lymphs Abs 0.6 (*)    All other components within normal limits  COMPREHENSIVE METABOLIC PANEL  CBC   ____________________________________________  EKG   EKG Interpretation  Date/Time:  Wednesday November 27 2016 11:14:54 EDT Ventricular Rate:  79 PR Interval:    QRS Duration: 112 QT Interval:  407 QTC Calculation: 467 R Axis:   -62 Text Interpretation:  Sinus or ectopic atrial rhythm Atrial  premature complex Abnormal R-wave progression, early transition LVH with IVCD, LAD and secondary repol abnrm Inferior infarct, old Similar to 2014 tracing. No pacing at this time. No STEMI.  Confirmed by Kensley Valladares MD, Neithan Day [phone]) on 11/27/2016 11:37:39 AM       ____________________________________________  RADIOLOGY  Dg Chest Portable 1 View  Result Date: 11/27/2016 CLINICAL DATA:  Cough.  Seizure-like activity. EXAM: PORTABLE CHEST 1 VIEW COMPARISON:  None. FINDINGS: Left pacer in place with leads in the right atrium and right ventricle. Heart is borderline in size. Lungs are clear. No effusions. No acute bony abnormality. IMPRESSION: Borderline heart size.  No active disease. Electronically Signed   By: Rolm Baptise M.D.   On: 11/27/2016 11:59    ____________________________________________   PROCEDURES  Procedure(s) performed:   Procedures  None ____________________________________________   INITIAL IMPRESSION / ASSESSMENT AND PLAN / ED COURSE  Pertinent labs & imaging results that were available during my care of the patient were reviewed by me and considered in my medical decision making (see chart for details).  Patient presents to the ED for evaluation of unresponsive episode and presumed breakthrough seizure. Missed 1 does of keppra two nights prior but otherwise compliant with medication. No infection symptoms. No head trauma. Patient with pacemaker in place. Plan to interrogate and obtain labs with Tao Satz post-ictal phase.   12:40 PM Patient remained somewhat drowsy but overall seems to be improving. No obvious seizure activity while here in the emergency department. No arrhythmia. Interrogation of his pacemaker shows five-minute and 21 seconds run of atrial tachycardia with rate to the 180s. No evidence of ventricular arrhythmia. Suspect that this elevated rate represents the patient actively seizing rather than syncope from an underlying arrhythmia. Given that the patient is  relatively slow to return to baseline and had a rather prolonged seizure will discuss the case with neurology on-call. The patient's neurologist is Dr. Manuella Ghazi in [place]. Current Keppra dosing is 500 mg BID.   Spoke with Neurology Dr. Merlene Laughter who will consult on the patient here at [facility].   Discussed patient's case with hospitalist.  Patient and family (if present) updated with plan. Care transferred to hospitalist service.  I reviewed all nursing notes, vitals, pertinent old records, EKGs, labs, imaging (as available).  ____________________________________________  FINAL CLINICAL IMPRESSION(S) / ED DIAGNOSES  Final diagnoses:  Seizure ([place])     MEDICATIONS GIVEN DURING THIS VISIT:  Medications  levETIRAcetam (KEPPRA) IVPB 1000 mg/100 mL premix (1,000 mg Intravenous Given 11/27/16 1154)  aspirin EC tablet 81 mg (81 mg Oral Not Given 11/27/16 1500)  donepezil (ARICEPT) tablet 5 mg (5 mg Oral Not Given 11/27/16 1500)  mirabegron ER (MYRBETRIQ) tablet 25 mg (25 mg Oral Not Given 11/27/16 1500)  simvastatin (ZOCOR) tablet 20 mg (not administered)  traZODone (DESYREL) tablet 50 mg (not administered)  carvedilol (COREG) tablet 12.5 mg (12.5 mg Oral Not Given 11/27/16 1500)  terazosin (HYTRIN) capsule 2 mg (not administered)  enoxaparin (LOVENOX) injection 40 mg (  not administered)  acetaminophen (TYLENOL) tablet 650 mg (not administered)    Or  acetaminophen (TYLENOL) suppository 650 mg (not administered)  hydrALAZINE (APRESOLINE) injection 10 mg (10 mg Intravenous Given 11/27/16 1732)  sodium chloride 0.9 % bolus 500 mL (0 mLs Intravenous Stopped 11/27/16 1324)     NEW OUTPATIENT MEDICATIONS STARTED DURING THIS VISIT:  None   I personally performed the services described in this documentation, which was scribed in my presence. The recorded information has been reviewed and is accurate.     Note:  This document was prepared using Dragon voice recognition software and may include  unintentional dictation errors.  Nanda Quinton, MD Emergency Medicine    Margette Fast, MD 11/27/16 Bosie Helper

## 2016-11-28 ENCOUNTER — Inpatient Hospital Stay (HOSPITAL_COMMUNITY): Payer: Medicare Other

## 2016-11-28 DIAGNOSIS — R55 Syncope and collapse: Secondary | ICD-10-CM

## 2016-11-28 LAB — ECHOCARDIOGRAM COMPLETE
E decel time: 387 ms
E/e' ratio: 11.37
FS: 34 % (ref 28–44)
Height: 72 in
IVS/LV PW RATIO, ED: 1.5
LA ID, A-P, ES: 29 mm
LA diam end sys: 29 mm
LA diam index: 1.42 cm/m2
LA vol A4C: 42.6 mL
LA vol index: 25 mL/m2
LA vol: 51 mL
LV E/e' medial: 11.37
LV E/e'average: 11.37
LV PW d: 10.3 mm — AB (ref 0.6–1.1)
LV dias vol index: 38 mL/m2
LV dias vol: 78 mL (ref 62–150)
LV e' LATERAL: 5.77 cm/s
LV sys vol index: 17 mL/m2
LV sys vol: 34 mL (ref 21–61)
LVOT SV: 72 mL
LVOT VTI: 20.8 cm
LVOT area: 3.46 cm2
LVOT diameter: 21 mm
LVOT peak grad rest: 4 mmHg
LVOT peak vel: 105 cm/s
MV Dec: 387
MV pk A vel: 86.7 m/s
MV pk E vel: 65.6 m/s
P 1/2 time: 543 ms
RV sys press: 21 mmHg
Reg peak vel: 212 cm/s
Simpson's disk: 56
Stroke v: 44 mL
TDI e' lateral: 5.77
TDI e' medial: 4.13
TR max vel: 212 cm/s
Weight: 2880 [oz_av]

## 2016-11-28 LAB — COMPREHENSIVE METABOLIC PANEL WITH GFR
ALT: 12 U/L — ABNORMAL LOW (ref 17–63)
AST: 18 U/L (ref 15–41)
Albumin: 2.8 g/dL — ABNORMAL LOW (ref 3.5–5.0)
Alkaline Phosphatase: 40 U/L (ref 38–126)
Anion gap: 4 — ABNORMAL LOW (ref 5–15)
BUN: 18 mg/dL (ref 6–20)
CO2: 26 mmol/L (ref 22–32)
Calcium: 8.3 mg/dL — ABNORMAL LOW (ref 8.9–10.3)
Chloride: 108 mmol/L (ref 101–111)
Creatinine, Ser: 1.1 mg/dL (ref 0.61–1.24)
GFR calc Af Amer: >60 mL/min
GFR calc non Af Amer: >60 mL/min
Glucose, Bld: 105 mg/dL — ABNORMAL HIGH (ref 65–99)
Potassium: 3.2 mmol/L — ABNORMAL LOW (ref 3.5–5.1)
Sodium: 138 mmol/L (ref 135–145)
Total Bilirubin: 0.6 mg/dL (ref 0.3–1.2)
Total Protein: 6.2 g/dL — ABNORMAL LOW (ref 6.5–8.1)

## 2016-11-28 LAB — CBC
HCT: 30.8 % — ABNORMAL LOW (ref 39.0–52.0)
Hemoglobin: 11.2 g/dL — ABNORMAL LOW (ref 13.0–17.0)
MCH: 32.2 pg (ref 26.0–34.0)
MCHC: 36.4 g/dL — ABNORMAL HIGH (ref 30.0–36.0)
MCV: 88.5 fL (ref 78.0–100.0)
Platelets: 91 K/uL — ABNORMAL LOW (ref 150–400)
RBC: 3.48 MIL/uL — ABNORMAL LOW (ref 4.22–5.81)
RDW: 13.5 % (ref 11.5–15.5)
WBC: 4.9 K/uL (ref 4.0–10.5)

## 2016-11-28 LAB — POTASSIUM: POTASSIUM: 3.8 mmol/L (ref 3.5–5.1)

## 2016-11-28 LAB — TROPONIN I: TROPONIN I: 0.04 ng/mL — AB (ref <0.03)

## 2016-11-28 NOTE — Progress Notes (Signed)
*  PRELIMINARY RESULTS* Echocardiogram 2D Echocardiogram has been performed.  Samuel Germany 11/28/2016, 3:17 PM

## 2016-11-28 NOTE — Progress Notes (Signed)
PROGRESS NOTE    Kenneth Vazquez  VPX:[id number] DOB: 31-Mar-1941 DOA: 11/27/2016 PCP: Volanda Napoleon, MD    Brief Narrative: Kenneth Vazquez is a 76 y.o. male with medical history significant of seizures, hypertension,  s/p PPM, h/o stroke, was brought in by EMS as he was found unresponsive by his wife.  Assessment & Plan:   Active Problems:   Seizures ([place])  Seizure episode:  Post ictal on admission. Suspect from non compliance to keppra. Neurology consulted and he was given 1000 mg of keppra bid, decreased the dose to 750 mg BID.  CT head negative for acute stroke.  PPM interrogated and unremarkable. overnight telemetry, some sinus bradycardia, pt asymptomatic.  Echocardiogram ordered and pending.    Hypertension: well controlled. Resume home meds.   Hypokalemia; replete as needed. Repeat level is normal.    Dementia: stable, no agitation.       DVT prophylaxis: (Lovenox Code Status: full code.  Family Communication: WIFE AT BEDSIDE.  Disposition Plan: pending ECHO.    Consultants:   Neurology.    Procedures: CT head  Echocardiogram.   Antimicrobials:none.   Subjective: No new complaints.   Objective: Vitals:   11/27/16 1718 11/27/16 1803 11/27/16 2158 11/28/16 0420  BP: (!) 189/89 (!) 157/84 (!) 149/81 (!) 159/92  Pulse:  69 84 60  Resp:  18 18 18   Temp:   98.2 F (36.8 C) 98.1 F (36.7 C)  TempSrc:   Oral Oral  SpO2:  96% 95% 100%  Weight:      Height:        Intake/Output Summary (Last 24 hours) at 11/28/16 1258 Last data filed at 11/28/16 0420  Gross per 24 hour  Intake              500 ml  Output              500 ml  Net                0 ml   Filed Weights   11/27/16 1120 11/27/16 1539  Weight: 78 kg (172 lb) 81.6 kg (180 lb)    Examination:  General exam: Appears calm and comfortable  Respiratory system: Clear to auscultation. Respiratory effort normal. Cardiovascular system: S1 & S2 heard, RRR. No JVD, murmurs,  rubs, gallops or clicks. No pedal edema. Gastrointestinal system: Abdomen is nondistended, soft and nontender. No organomegaly or masses felt. Normal bowel sounds heard. Central nervous system: Alert and oriented to place and person only. No focal signs. . Extremities: Symmetric 5 x 5 power. Skin: No rashes, lesions or ulcers Psychiatry: confused, not agitatated    Data Reviewed: I have personally reviewed following labs and imaging studies  CBC:  Recent Labs Lab 11/27/16 1201 11/28/16 0123  WBC 4.9 4.9  NEUTROABS 3.9  --   HGB 11.5* 11.2*  HCT 32.8* 30.8*  MCV 89.6 88.5  PLT 86* 91*   Basic Metabolic Panel:  Recent Labs Lab 11/27/16 1201 11/28/16 0123 11/28/16 0742  NA 138 138  --   K 3.7 3.2* 3.8  CL 105 108  --   CO2 27 26  --   GLUCOSE 199* 105*  --   BUN 22* 18  --   CREATININE 1.40* 1.10  --   CALCIUM 8.5* 8.3*  --    GFR: Estimated Creatinine Clearance: 61.7 mL/min (by C-G formula based on SCr of 1.1 mg/dL). Liver Function Tests:  Recent Labs Lab 11/27/16 1201 11/28/16 0123  AST 22 18  ALT 10* 12*  ALKPHOS 45 40  BILITOT 0.7 0.6  PROT 6.7 6.2*  ALBUMIN 3.2* 2.8*   No results for input(s): LIPASE, AMYLASE in the last 168 hours. No results for input(s): AMMONIA in the last 168 hours. Coagulation Profile: No results for input(s): INR, PROTIME in the last 168 hours. Cardiac Enzymes:  Recent Labs Lab 11/27/16 1931 11/28/16 0123 11/28/16 0742  TROPONINI 0.04* 0.04* <0.03   BNP (last 3 results) No results for input(s): PROBNP in the last 8760 hours. HbA1C: No results for input(s): HGBA1C in the last 72 hours. CBG: No results for input(s): GLUCAP in the last 168 hours. Lipid Profile: No results for input(s): CHOL, HDL, LDLCALC, TRIG, CHOLHDL, LDLDIRECT in the last 72 hours. Thyroid Function Tests: No results for input(s): TSH, T4TOTAL, FREET4, T3FREE, THYROIDAB in the last 72 hours. Anemia Panel: No results for input(s): VITAMINB12,  FOLATE, FERRITIN, TIBC, IRON, RETICCTPCT in the last 72 hours. Sepsis Labs: No results for input(s): PROCALCITON, LATICACIDVEN in the last 168 hours.  No results found for this or any previous visit (from the past 240 hour(s)).       Radiology Studies: Ct Head Wo Contrast  Result Date: 11/27/2016 CLINICAL DATA:  Syncope. EXAM: CT HEAD WITHOUT CONTRAST TECHNIQUE: Contiguous axial images were obtained from the base of the skull through the vertex without intravenous contrast. COMPARISON:  None. FINDINGS: Brain: Moderate generalized cerebral and cerebellar atrophy. Encephalomalacia in the high parietal lobes bilaterally, likely sequela of prior infarcts. Moderate periventricular white matter hypodensity is nonspecific, most commonly secondary to chronic small vessel ischemia. Remote appearing lacunar infarct in the right caudate. Tiny remote lacunar infarct in left cerebellum. No intracranial hemorrhage. No hydrocephalus, mass effect, or midline shift. No subdural or extra-axial fluid collection. Vascular: Atherosclerosis of skullbase vasculature without hyperdense vessel or abnormal calcification. Skull: Normal. Negative for fracture or focal lesion. Sinuses/Orbits: Mucosal thickening of scattered ethmoid air cells. Paranasal sinuses are otherwise clear. Mastoid air size oral aerated. Probable right cataract resection. Other: None. IMPRESSION: 1. No evidence of acute abnormality. 2. Atrophy, chronic and remote ischemia. Electronically Signed   By: Jeb Levering M.D.   On: 11/27/2016 22:10   Dg Chest Portable 1 View  Result Date: 11/27/2016 CLINICAL DATA:  Cough.  Seizure-like activity. EXAM: PORTABLE CHEST 1 VIEW COMPARISON:  None. FINDINGS: Left pacer in place with leads in the right atrium and right ventricle. Heart is borderline in size. Lungs are clear. No effusions. No acute bony abnormality. IMPRESSION: Borderline heart size.  No active disease. Electronically Signed   By: Rolm Baptise M.D.    On: 11/27/2016 11:59        Scheduled Meds: . aspirin EC  81 mg Oral Daily  . carvedilol  12.5 mg Oral BID  . donepezil  10 mg Oral q morning - 10a  . enoxaparin (LOVENOX) injection  40 mg Subcutaneous Q24H  . levETIRAcetam  750 mg Oral BID  . mirabegron ER  25 mg Oral Daily  . simvastatin  20 mg Oral QHS  . terazosin  2 mg Oral QHS  . traZODone  50 mg Oral QHS   Continuous Infusions:   LOS: 1 day    Time spent:35 minutes.     Hosie Poisson, MD [facility] Pager [phone]   If 7PM-7AM, please contact night-coverage [email or web address] Password [facility] 11/28/2016, 12:58 PM

## 2016-11-28 NOTE — Progress Notes (Signed)
Kenneth Plaine A. Merlene Laughter, MD     [email or web address]          Kenneth Vazquez is an 76 y.o. male.   ASSESSMENT/PLAN: 1. Acute encephalopathy thought to be due to post ictal lethargy and confusion status post breakthrough seizure. It appears that this is resolving. 2. Epilepsy with breakthrough seizures although there is some question that he may have missed a dose of his medication. However, I would recommend increasing the dose of his baseline Keppra to 750 mg twice a day. The patient can be discharged tomorrow and follow-up with his baseline primary neurologist. 3. Baseline Alzheimer's dementia for which recommend  increasing the Aricept to dose of 10 mg a day.      No recurrent spells.  The family is at the bedside and reports that the patient had a seizure in the summer of 2017.   GENERAL: Doing well. He is sent up in bed is feeding himself.  HEENT: Normal  ABDOMEN: soft  EXTREMITIES: No edema   BACK: Normal  SKIN: Normal by inspection.    MENTAL STATUS:  He is still only oriented to person.  CRANIAL NERVES: Pupils approximately 7 mm on the right elliptical shaped and nonreactive, the left is 4 mm and reactive ; extra ocular movements are full, there is no significant nystagmus; visual fields are full; upper and lower facial muscles are normal in strength and symmetric, there is no flattening of the nasolabial folds; tongue is midline; uvula is midline; shoulder elevation is normal.  MOTOR: exact strength is no obtainable but he does seem to have good strength throughout and good bulk and tone.   COORDINATION: Left finger to nose is normal, right finger to nose is normal, No rest tremor; no intention tremor; no postural tremor; no bradykinesia.                    [[[[[[[[[[[[[[[[[[[[PRIMARY NEUROLOGY NOTE: Kenneth Church, MD - 09/24/2016 2:30 PM EST Formatting of this note may be different from the original. PCP: Dr. Marlynn Perking    Disease Summary:   Kenneth Vazquez is a right handed 76 y.o. 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 This lasted for about a minute or so while she called his name to come to. Patient does not remember this spell but the wife did take him to [facility] and he stayed overnight. On May 18th 2015 the patient was at home sitting at the bar drinking coffee when his wife walked by and realized that he was somewhat slumped over looking like he was in a daze and he was unresponsive. The wife feels like this time it took him a little bit longer to come too. She said after this time he seemed a little more disoriented afterward. She called EMT this time and they took him to the hospital and stayed overnight. At this time they started him Keppra 500 mg BID. 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 They scheduled him for a CEA on June 18th 2015 and why at the hospital the nurse witnessed a similar spell to what he had previously and he wasn't able to give them his name etc. They rescheduled his surgery and decided to place a stent. Patient has  had MRI, CT Scan, Blood work and EEG (we did not find EEG report in hospital record) at this hospital and they stated that the patient had a stroke as well as seizures.   He also has a slight change in his speech when it comes to finding his words or fumbling for his words at tim. Patient feels that cognitively things take a little longer for him to process than before. Net stroke etiology work up Aspirin, statin  Seizure: he became unresponsive. His wife witnessed the event. He denies having any numbness, slurred speech, or weakness during event. Unresponsivemess lasted for for a brief time. He was taken to [facility] where  they performed a CT scan on him.   EEG: 08/2014 - This is an abnormal EEG due to intermittent right sided slowing and infrequent, independent bi-frontal spike discharges. Frequent EKG abnormalities noted.  Ambulatory EEG 06/29/15 - Anterior/posterior temporal epileptiform discharges  Keppra (poor compliance due to forgetting to take medicine) Breakthrough seizure (05/2015) - with missing medication  Dementia - patient forgets what he is doing, what he has said. Some sundowning. This has come on gradually.   Interim History:  Kenneth Vazquez is a 76 y.o. male here for follow up of Seizures . Since his last visit on 05/01/2016 with [place], patient states he has not had any new strokes or seizures that he is aware of. He continues to take Keppra 500 mg two times a day. His last seizure was in 05/2015. He states his memory is doing okay. Per wife he seems to think clearer than at his last visit. He takes Aricept 5 mg nightly.   [name] mentioned that he is taking his medications as prescribed. ]]]]]]]]]]]]]]]]]]]]]]]]]]]]]]]]]]]]]]]]]]]]]  Blood pressure (!) 155/86, pulse 74, temperature 98.2 F (36.8 C), resp. rate 18, height 6' (1.829 m), weight 180 lb (81.6 kg), SpO2 98 %.  Past Medical History:  Diagnosis Date  . Arthritis   . Hypertension   . Pacemaker   . Seizures ([place])   . Stroke [facility])     Past Surgical History:  Procedure Laterality Date  . PACEMAKER INSERTION      No family history on file.  Social History:  reports that he has never smoked. He has never used smokeless tobacco. He reports that he does not drink alcohol. His drug history is not on file.  Allergies:  Allergies  Allergen Reactions  . Sulfa Antibiotics Hives    Medications: Prior to Admission medications   Medication Sig Start Date End Date Taking? Authorizing Provider  aspirin EC 81 MG tablet Take 81 mg by mouth daily.   Yes Historical Provider, MD  carvedilol (COREG) 12.5 MG tablet Take 12.5 mg by mouth 2  (two) times daily.   Yes Historical Provider, MD  donepezil (ARICEPT) 5 MG tablet Take 5 mg by mouth every morning.   Yes Historical Provider, MD  Ferrous Gluconate (IRON 27 PO) Take 1 tablet by mouth daily.   Yes Historical Provider, MD  furosemide (LASIX) 20 MG tablet Take 20 mg by mouth daily.   Yes Historical Provider, MD  levETIRAcetam (KEPPRA) 500 MG tablet Take 500 mg by mouth 2 (two) times daily.   Yes Historical Provider, MD  lisinopril (PRINIVIL,ZESTRIL) 2.5 MG tablet Take 2.5 mg by mouth daily. 11/13/16  Yes Historical Provider, MD  MYRBETRIQ 25 MG TB24 tablet Take 1 tablet by mouth daily. 11/13/16  Yes Historical Provider, MD  simvastatin (ZOCOR) 20 MG tablet Take 1 tablet by mouth at bedtime.  10/14/15  Yes Historical Provider, MD  terazosin (HYTRIN) 2 MG capsule Take 2 mg by mouth at bedtime.    Yes Historical Provider, MD  traZODone (DESYREL) 50 MG tablet Take 50-100 mg by mouth at bedtime.   Yes Historical Provider, MD    Scheduled Meds: . aspirin EC  81 mg Oral Daily  . carvedilol  12.5 mg Oral BID  . donepezil  10 mg Oral q morning - 10a  . enoxaparin (LOVENOX) injection  40 mg Subcutaneous Q24H  . levETIRAcetam  750 mg Oral BID  . mirabegron ER  25 mg Oral Daily  . simvastatin  20 mg Oral QHS  . terazosin  2 mg Oral QHS  . traZODone  50 mg Oral QHS   Continuous Infusions: PRN Meds:.acetaminophen **OR** acetaminophen, hydrALAZINE     Results for orders placed or performed during the hospital encounter of 11/27/16 (from the past 48 hour(s))  Comprehensive metabolic panel     Status: Abnormal   Collection Time: 11/27/16 12:01 PM  Result Value Ref Range   Sodium 138 135 - 145 mmol/L   Potassium 3.7 3.5 - 5.1 mmol/L   Chloride 105 101 - 111 mmol/L   CO2 27 22 - 32 mmol/L   Glucose, Bld 199 (H) 65 - 99 mg/dL   BUN 22 (H) 6 - 20 mg/dL   Creatinine, Ser 1.40 (H) 0.61 - 1.24 mg/dL   Calcium 8.5 (L) 8.9 - 10.3 mg/dL   Total Protein 6.7 6.5 - 8.1 g/dL   Albumin 3.2 (L)  3.5 - 5.0 g/dL   AST 22 15 - 41 U/L   ALT 10 (L) 17 - 63 U/L   Alkaline Phosphatase 45 38 - 126 U/L   Total Bilirubin 0.7 0.3 - 1.2 mg/dL   GFR calc non Af Amer 47 (L) >60 mL/min   GFR calc Af Amer 54 (L) >60 mL/min    Comment: (NOTE) The eGFR has been calculated using the CKD EPI equation. This calculation has not been validated in all clinical situations. eGFR's persistently <60 mL/min signify possible Chronic Kidney Disease.    Anion gap 6 5 - 15  CBC with Differential     Status: Abnormal   Collection Time: 11/27/16 12:01 PM  Result Value Ref Range   WBC 4.9 4.0 - 10.5 K/uL   RBC 3.66 (L) 4.22 - 5.81 MIL/uL   Hemoglobin 11.5 (L) 13.0 - 17.0 g/dL   HCT 32.8 (L) 39.0 - 52.0 %   MCV 89.6 78.0 - 100.0 fL   MCH 31.4 26.0 - 34.0 pg   MCHC 35.1 30.0 - 36.0 g/dL   RDW 13.6 11.5 - 15.5 %   Platelets 86 (L) 150 - 400 K/uL    Comment: SPECIMEN CHECKED FOR CLOTS LARGE PLATELETS PRESENT PLATELET COUNT CONFIRMED BY SMEAR    Neutrophils Relative % 80 %   Neutro Abs 3.9 1.7 - 7.7 K/uL   Lymphocytes Relative 13 %   Lymphs Abs 0.6 (L) 0.7 - 4.0 K/uL   Monocytes Relative 6 %   Monocytes Absolute 0.3 0.1 - 1.0 K/uL   Eosinophils Relative 1 %   Eosinophils Absolute 0.1 0.0 - 0.7 K/uL   Basophils Relative 0 %   Basophils Absolute 0.0 0.0 - 0.1 K/uL  Troponin I     Status: Abnormal   Collection Time: 11/27/16  7:31 PM  Result Value Ref Range   Troponin I 0.04 (HH) <0.03 ng/mL    Comment: CRITICAL RESULT CALLED TO, READ BACK  BY AND VERIFIED WITH: HANDY,T AT 2020 ON 3.28.2018 BY ISLEY,B   Comprehensive metabolic panel     Status: Abnormal   Collection Time: 11/28/16  1:23 AM  Result Value Ref Range   Sodium 138 135 - 145 mmol/L   Potassium 3.2 (L) 3.5 - 5.1 mmol/L   Chloride 108 101 - 111 mmol/L   CO2 26 22 - 32 mmol/L   Glucose, Bld 105 (H) 65 - 99 mg/dL   BUN 18 6 - 20 mg/dL   Creatinine, Ser 1.10 0.61 - 1.24 mg/dL   Calcium 8.3 (L) 8.9 - 10.3 mg/dL   Total Protein 6.2 (L) 6.5  - 8.1 g/dL   Albumin 2.8 (L) 3.5 - 5.0 g/dL   AST 18 15 - 41 U/L   ALT 12 (L) 17 - 63 U/L   Alkaline Phosphatase 40 38 - 126 U/L   Total Bilirubin 0.6 0.3 - 1.2 mg/dL   GFR calc non Af Amer >60 >60 mL/min   GFR calc Af Amer >60 >60 mL/min    Comment: (NOTE) The eGFR has been calculated using the CKD EPI equation. This calculation has not been validated in all clinical situations. eGFR's persistently <60 mL/min signify possible Chronic Kidney Disease.    Anion gap 4 (L) 5 - 15  CBC     Status: Abnormal   Collection Time: 11/28/16  1:23 AM  Result Value Ref Range   WBC 4.9 4.0 - 10.5 K/uL   RBC 3.48 (L) 4.22 - 5.81 MIL/uL   Hemoglobin 11.2 (L) 13.0 - 17.0 g/dL   HCT 30.8 (L) 39.0 - 52.0 %   MCV 88.5 78.0 - 100.0 fL   MCH 32.2 26.0 - 34.0 pg   MCHC 36.4 (H) 30.0 - 36.0 g/dL   RDW 13.5 11.5 - 15.5 %   Platelets 91 (L) 150 - 400 K/uL    Comment: SPECIMEN CHECKED FOR CLOTS CONSISTENT WITH PREVIOUS RESULT   Troponin I     Status: Abnormal   Collection Time: 11/28/16  1:23 AM  Result Value Ref Range   Troponin I 0.04 (HH) <0.03 ng/mL    Comment: CRITICAL VALUE NOTED.  VALUE IS CONSISTENT WITH PREVIOUSLY REPORTED AND CALLED VALUE.  Troponin I     Status: None   Collection Time: 11/28/16  7:42 AM  Result Value Ref Range   Troponin I <0.03 <0.03 ng/mL  Potassium     Status: None   Collection Time: 11/28/16  7:42 AM  Result Value Ref Range   Potassium 3.8 3.5 - 5.1 mmol/L    Studies/Results:     [place] Selke A. Merlene Vazquez, M.D.  Diplomate, Tax adviser of Psychiatry and Neurology ( Neurology). 11/28/2016, 5:33 PM

## 2016-11-29 MED ORDER — DONEPEZIL HCL 10 MG PO TABS: 10.0000 mg | ORAL_TABLET | Freq: Every morning | ORAL | 0 refills | Status: DC

## 2016-11-29 MED ORDER — LEVETIRACETAM 750 MG PO TABS: 750.0000 mg | ORAL_TABLET | Freq: Two times a day (BID) | ORAL | 0 refills | Status: DC

## 2016-11-29 NOTE — Progress Notes (Signed)
Pt discharged in stable condition via wheelchair into the care of his wife via private vehicle.  PIV removed intact and w/o S&S of complications.  Condom cath removed intact w/o S&S of complications. Discharge instructions reviewed with pt/wife.  Pt/wife verbalized understanding. [facility] delivered home equipment and equipment was sent with pt.

## 2016-11-29 NOTE — Evaluation (Signed)
Physical Therapy Evaluation Patient Details Name: Kenneth Vazquez MRN: [id number] DOB: 1941/06/30 Today's Date: 11/29/2016   History of Present Illness  76 y.o. male with medical history significant of seizures, hypertension,  s/p PPM, h/o stroke, was brought in by EMS as he was found unresponsive by his wife. Most of the history was obtained from the wife at bedside. He was still lethargic, was oriented to place and person and not to time. As per his wife, the patient was eating breakfast, and wife left the room to talk to her brother and when she got back to her husband, he was found unresponsive at the table.   Dx: seizure  Clinical Impression  Pt received in bed, wife present, and pt is agreeable to PT evaluation.  Pt normally uses a RW for household ambulation only.  He is normally independent with dressing and bathing.  Wife performs all household duties and runs errands.  During today's PT evaluation he need for assistance with bed mobility, and Min A for sit<>stand.  During gait he demonstrates a significant shuffled gait pattern with forward flexed trunk.  At times he requires assistance for weight shifting to advance his LE's for taking steps.  He demonstrates flat affect throughout evaluation, and his mobility and lack of coordination, along with shuffled gait are very similar to Parkinsons like mobility.  Pt is recommended to return home with HHPT, as well as HHOT and a BSC.    Follow Up Recommendations Home health PT;Other (comment) (HHOT)    Equipment Recommendations  3in1 (PT)    Recommendations for Other Services       Precautions / Restrictions Precautions Precautions: Fall Precaution Comments: Pt reports 2-3 falls in the past 6 months, and 2 with injury.   Restrictions Weight Bearing Restrictions: No      Mobility  Bed Mobility Overal bed mobility: Needs Assistance Bed Mobility: Supine to Sit;Sit to Supine     Supine to sit: HOB elevated;Supervision Sit to supine:  Min assist   General bed mobility comments: supine scoot with pt in hooklying. Pt requires assistance to scoot to the [facility] with bed pad due to poor ability to coordinate mobility.    Transfers Overall transfer level: Needs assistance Equipment used: Rolling walker (2 wheeled) Transfers: Sit to/from Stand Sit to Stand: Min assist         General transfer comment: Pt requires cues to lean fwd and requires increasing time due to posterior LOB initially.    Ambulation/Gait Ambulation/Gait assistance: Min assist Ambulation Distance (Feet): 20 Feet Assistive device: Rolling walker (2 wheeled) Gait Pattern/deviations: Shuffle;Decreased step length - right;Decreased step length - left;Trunk flexed;Decreased dorsiflexion - left;Decreased dorsiflexion - right   Gait velocity interpretation: <1.8 ft/sec, indicative of risk for recurrent falls General Gait Details: Pt requires contant cues to step into the base of the RW, he also requires some assistance for weight shifting both directions in order to advance either LE.  R LE was more difficult to advance than the left with noted quadricep weakness.  Pt gait is very shuffled, and seems Parkinson's like.  Stairs            Wheelchair Mobility    Modified Rankin (Stroke Patients Only)       Balance Overall balance assessment: History of Falls;Needs assistance Sitting-balance support: Bilateral upper extremity supported;Feet supported Sitting balance-Leahy Scale: Good     Standing balance support: Bilateral upper extremity supported Standing balance-Leahy Scale: Fair  Pertinent Vitals/Pain Pain Assessment: 0-10 Pain Score: 2  Pain Location: back Pain Descriptors / Indicators: Aching Pain Intervention(s): Limited activity within patient's tolerance;Monitored during session;Repositioned    Home Living   Living Arrangements: Spouse/significant other Available Help at Discharge:  Available 24 hours/day;Family (children take care of the lawn. ) Type of Home: House Home Access: Stairs to enter   [employer] of Steps: 3 with HR Home Layout: One level Home Equipment: Walker - 2 wheels;Cane - single point      Prior Function     Gait / Transfers Assistance Needed: Pt states he uses a RW sometimes.  Pt uses it both in and out of the house.  Pt is mostly a household ambulator.    ADL's / Homemaking Assistance Needed: Pt states he is independent with dressing and bathing.    Comments: wife assists with groceries and running errands.      Hand Dominance   Dominant Hand: Right    Extremity/Trunk Assessment        Lower Extremity Assessment Lower Extremity Assessment: RLE deficits/detail;LLE deficits/detail RLE Deficits / Details: Knee extension 3-/5, hip flexion 3/5, ankle DF: 2/5 RLE Coordination: decreased fine motor;decreased gross motor LLE Deficits / Details: Knee extension 3/5, hip flexion 3/5, ankle DF: 2/5 LLE Coordination: decreased fine motor;decreased gross motor       Communication      Cognition Arousal/Alertness: Awake/alert Behavior During Therapy: Flat affect Overall Cognitive Status: Within Functional Limits for tasks assessed                                 General Comments: Pt was not able to tell me where he was, He was given 3 options and on the 2nd attempt he was able to choose the correct answer "hospital,"  He was unable to tell me the date.      General Comments      Exercises General Exercises - Lower Extremity Long Arc Quad: AROM;Both;10 reps;Seated;Limitations Long [employer] Limitations: reciprocal pattern - pt had difficulty with coordination and would end up lifting both legs at the same time.  Pt requires extensive hands on cues for appropriate technique.  Hip Flexion/Marching: AROM;Both;10 reps;Seated;Limitations Hip Flexion/Marching Limitations: reciprocal pattern - pt had difficulty with  coordination.  Pt requires extensive hands on cues for appropriate technique.  Toe Raises:  (Attempted heel raises, however pt is not able to coordinate this task despite having the strength and coordination to do it.  )   Assessment/Plan    PT Assessment Patient needs continued PT services  PT Problem List Decreased strength;Decreased activity tolerance;Decreased balance;Decreased mobility;Decreased coordination;Decreased cognition;Decreased knowledge of use of DME;Decreased safety awareness       PT Treatment Interventions DME instruction;Gait training;Functional mobility training;Therapeutic activities;Therapeutic exercise;Balance training;Patient/family education;Neuromuscular re-education    PT Goals (Current goals can be found in the Care Plan section)  Acute Rehab PT Goals Patient Stated Goal: To go home PT Goal Formulation: With patient/family Time For Goal Achievement: 12/06/16 Potential to Achieve Goals: Fair    Frequency Min 5X/week   Barriers to discharge        Co-evaluation               End of Session Equipment Utilized During Treatment: Gait belt Activity Tolerance: Patient limited by fatigue Patient left: in bed;with call bell/phone within reach;with family/visitor present Nurse Communication: Mobility status (mobility sheet left in the room) PT Visit Diagnosis: History  of falling (Z91.81);Muscle weakness (generalized) (M62.81);Other abnormalities of gait and mobility (R26.89)    Time: 1660-6301 PT Time Calculation (min) (ACUTE ONLY): 50 min   Charges:   PT Evaluation $PT Eval Low Complexity: 1 Procedure PT Treatments $Gait Training: 8-22 mins $Therapeutic Exercise: 8-22 mins   PT G Codes:   PT G-Codes **NOT FOR INPATIENT CLASS** Functional Assessment Tool Used: AM-PAC 6 Clicks Basic Mobility;Clinical judgement Functional Limitation: Mobility: Walking and moving around Mobility: Walking and Moving Around Current Status (S0109): At least 20  percent but less than 40 percent impaired, limited or restricted Mobility: Walking and Moving Around Goal Status [phone]): At least 1 percent but less than 20 percent impaired, limited or restricted    Beth Hammad Finkler, PT, DPT X: [phone]

## 2016-11-29 NOTE — Care Management Important Message (Signed)
Important Message  Patient Details  Name: Kenneth Vazquez MRN: [id number] Date of Birth: 1940/10/17   Medicare Important Message Given:  Yes    Abelina Ketron, Chauncey Reading, RN 11/29/2016, 1:46 PM

## 2016-11-29 NOTE — Care Management Note (Addendum)
Case Management Note  Patient Details  Name: Kenneth Vazquez MRN: [id number] Date of Birth: 1941-04-27  Subjective/Objective:       Patient adm from home with seizures. Lives with wife, ind with ADL's. Has Cane and RW PTA. Has PCP and reports no issues affording medications. Would like HH RN.  PT eval pending.   Offered choice of Home health agencies.          Action/Plan: Romualdo Bolk of [facility] notified and will obtain orders from chart. Patient aware that [facility] has 48 hours to initiate services.   Addendum: Added PT and OT to [facility] orders as well as 3 in 1.   Expected Discharge Date:  11/29/16               Expected Discharge Plan:  [place]  In-House Referral:     Discharge planning Services  CM Consult  Post Acute Care Choice:    Choice offered to:  Patient, Spouse  DME Arranged:    DME Agency:     HH Arranged:  RN, PT [place] Agency:  [place]  Status of Service:  In process, will continue to follow  If discussed at Long Length of Stay Meetings, dates discussed:    Additional Comments:  Khary Schaben, Chauncey Reading, RN 11/29/2016, 12:57 PM

## 2016-11-29 NOTE — Care Management (Signed)
    Durable Medical Equipment        Start     Ordered   11/29/16 1344  For home use only DME 3 n 1  Once     11/29/16 1343

## 2016-12-06 ENCOUNTER — Other Ambulatory Visit (HOSPITAL_COMMUNITY)
Admission: RE | Admit: 2016-12-06 | Discharge: 2016-12-06 | Disposition: A | Discharge status: Home / Self Care | Payer: Medicare Other | Source: Other Acute Inpatient Hospital | Attending: Internal Medicine | Admitting: Internal Medicine

## 2016-12-06 DIAGNOSIS — N390 Urinary tract infection, site not specified: Secondary | ICD-10-CM | POA: Diagnosis present

## 2016-12-06 LAB — URINALYSIS, COMPLETE (UACMP) WITH MICROSCOPIC
BACTERIA UA: NONE SEEN
Bilirubin Urine: NEGATIVE
Glucose, UA: NEGATIVE mg/dL
Ketones, ur: NEGATIVE mg/dL
Leukocytes, UA: NEGATIVE
Nitrite: NEGATIVE
Protein, ur: NEGATIVE mg/dL
SPECIFIC GRAVITY, URINE: 1.025 (ref 1.005–1.030)
pH: 5 (ref 5.0–8.0)

## 2016-12-12 NOTE — Discharge Summary (Addendum)
Physician Discharge Summary  Kenneth Vazquez WGY:[id number] DOB: 11/18/1940 DOA: 11/27/2016  PCP: Volanda Napoleon, MD  Admit date: 11/27/2016 Discharge date: 11/29/2016  Admitted From: Home Disposition:  Home   Recommendations for Outpatient Follow-up:  1. Follow up with PCP in 1-2 weeks 2. Please obtain BMP/CBC in one week 3. Please follow up with neurology as recommended.   Home Health:yes  Discharge Condition:stable.  CODE STATUS:full code.  Diet recommendation: Heart Health Brief/Interim Summary: Kenneth Vazquez a 76 y.o.malewith medical history significant of seizures, hypertension, s/p PPM, h/o stroke, was brought in by EMS as he was found unresponsive by his wife.  Discharge Diagnoses:  Active Problems:   Seizures ([place])  Seizure episode:  Post ictal on admission. Suspect from non compliance to keppra. Neurology consulted and he was given 1000 mg of keppra bid, decreased the dose to 750 mg BID.  CT head negative for acute stroke.  PPM interrogated and unremarkable. overnight telemetry, some sinus bradycardia, pt asymptomatic.  Echocardiogram ordered and unremarkable. Recommend outpatient follow up with neurology.    Hypertension: well controlled. Resume home meds.   Hypokalemia; replete as needed. Repeat level is normal.    Dementia: stable, no agitation.    Discharge Instructions  Discharge Instructions    Diet - low sodium heart healthy    Complete by:  As directed    Discharge instructions    Complete by:  As directed    Please follow up with neurology in one week.     Allergies as of 11/29/2016      Reactions   Sulfa Antibiotics Hives      Medication List    STOP taking these medications   lisinopril 2.5 MG tablet Commonly known as:  PRINIVIL,ZESTRIL     TAKE these medications   aspirin EC 81 MG tablet Take 81 mg by mouth daily.   carvedilol 12.5 MG tablet Commonly known as:  COREG Take 12.5 mg by mouth 2 (two) times  daily.   donepezil 10 MG tablet Commonly known as:  ARICEPT Take 1 tablet (10 mg total) by mouth every morning. What changed:  medication strength  how much to take   furosemide 20 MG tablet Commonly known as:  LASIX Take 20 mg by mouth daily.   IRON 27 PO Take 1 tablet by mouth daily.   levETIRAcetam 750 MG tablet Commonly known as:  KEPPRA Take 1 tablet (750 mg total) by mouth 2 (two) times daily. What changed:  medication strength  how much to take   MYRBETRIQ 25 MG Tb24 tablet Generic drug:  mirabegron ER Take 1 tablet by mouth daily.   simvastatin 20 MG tablet Commonly known as:  ZOCOR Take 1 tablet by mouth at bedtime.   terazosin 2 MG capsule Commonly known as:  HYTRIN Take 2 mg by mouth at bedtime.   traZODone 50 MG tablet Commonly known as:  DESYREL Take 50-100 mg by mouth at bedtime.      Follow-up Information    TEJAN-SIE, Clotilde Dieter, MD. Schedule an appointment as soon as possible for a visit in 1 week(s).   Specialty:  Internal Medicine Why:  follow up for post hospitalization.  Contact information: [place] [address] [facility].   Contact information: [place] [address] [phone]         Allergies  Allergen Reactions  . Sulfa Antibiotics Hives  Consultations:  Neurology.    Procedures/Studies: Ct Head Wo Contrast  Result Date: 11/27/2016 CLINICAL DATA:  Syncope. EXAM: CT HEAD WITHOUT CONTRAST TECHNIQUE: Contiguous axial images were obtained from the base of the skull through the vertex without intravenous contrast. COMPARISON:  None. FINDINGS: Brain: Moderate generalized cerebral and cerebellar atrophy. Encephalomalacia in the high parietal lobes bilaterally, likely sequela of prior infarcts. Moderate periventricular white matter hypodensity is nonspecific, most commonly secondary to chronic small vessel ischemia. Remote  appearing lacunar infarct in the right caudate. Tiny remote lacunar infarct in left cerebellum. No intracranial hemorrhage. No hydrocephalus, mass effect, or midline shift. No subdural or extra-axial fluid collection. Vascular: Atherosclerosis of skullbase vasculature without hyperdense vessel or abnormal calcification. Skull: Normal. Negative for fracture or focal lesion. Sinuses/Orbits: Mucosal thickening of scattered ethmoid air cells. Paranasal sinuses are otherwise clear. Mastoid air size oral aerated. Probable right cataract resection. Other: None. IMPRESSION: 1. No evidence of acute abnormality. 2. Atrophy, chronic and remote ischemia. Electronically Signed   By: Jeb Levering M.D.   On: 11/27/2016 22:10   Dg Chest Portable 1 View  Result Date: 11/27/2016 CLINICAL DATA:  Cough.  Seizure-like activity. EXAM: PORTABLE CHEST 1 VIEW COMPARISON:  None. FINDINGS: Left pacer in place with leads in the right atrium and right ventricle. Heart is borderline in size. Lungs are clear. No effusions. No acute bony abnormality. IMPRESSION: Borderline heart size.  No active disease. Electronically Signed   By: Rolm Baptise M.D.   On: 11/27/2016 11:59      Subjective: No new complaints.   Discharge Exam: Vitals:   11/28/16 2200 11/29/16 0503  BP: (!) 175/91 (!) 170/94  Pulse: (!) 59 60  Resp:  18  Temp:  97.9 F (36.6 C)   Vitals:   11/28/16 1400 11/28/16 2040 11/28/16 2200 11/29/16 0503  BP: (!) 155/86  (!) 175/91 (!) 170/94  Pulse: 74  (!) 59 60  Resp: 18   18  Temp: 98.2 F (36.8 C)   97.9 F (36.6 C)  TempSrc:    Oral  SpO2: 98% 95%  98%  Weight:      Height:        General: Pt is alert, awake, not in acute distress Cardiovascular: RRR, S1/S2 +, no rubs, no gallops Respiratory: CTA bilaterally, no wheezing, no rhonchi Abdominal: Soft, NT, ND, bowel sounds + Extremities: no edema, no cyanosis    The results of significant diagnostics from this hospitalization (including  imaging, microbiology, ancillary and laboratory) are listed below for reference.     Microbiology: No results found for this or any previous visit (from the past 240 hour(s)).   Labs: BNP (last 3 results) No results for input(s): BNP in the last 8760 hours. Basic Metabolic Panel: No results for input(s): NA, K, CL, CO2, GLUCOSE, BUN, CREATININE, CALCIUM, MG, PHOS in the last 168 hours. Liver Function Tests: No results for input(s): AST, ALT, ALKPHOS, BILITOT, PROT, ALBUMIN in the last 168 hours. No results for input(s): LIPASE, AMYLASE in the last 168 hours. No results for input(s): AMMONIA in the last 168 hours. CBC: No results for input(s): WBC, NEUTROABS, HGB, HCT, MCV, PLT in the last 168 hours. Cardiac Enzymes: No results for input(s): CKTOTAL, CKMB, CKMBINDEX, TROPONINI in the last 168 hours. BNP: Invalid input(s): POCBNP CBG: No results for input(s): GLUCAP in the last 168 hours. D-Dimer No results for input(s): DDIMER in the last 72 hours. Hgb A1c No results for input(s): HGBA1C in the last 72 hours. Lipid  Profile No results for input(s): CHOL, HDL, LDLCALC, TRIG, CHOLHDL, LDLDIRECT in the last 72 hours. Thyroid function studies No results for input(s): TSH, T4TOTAL, T3FREE, THYROIDAB in the last 72 hours.  Invalid input(s): FREET3 Anemia work up No results for input(s): VITAMINB12, FOLATE, FERRITIN, TIBC, IRON, RETICCTPCT in the last 72 hours. Urinalysis    Component Value Date/Time   COLORURINE YELLOW 12/06/2016 1130   APPEARANCEUR CLEAR 12/06/2016 1130   APPEARANCEUR Hazy 02/02/2014 1501   LABSPEC 1.025 12/06/2016 1130   LABSPEC 1.023 02/02/2014 1501   PHURINE 5.0 12/06/2016 1130   GLUCOSEU NEGATIVE 12/06/2016 1130   GLUCOSEU Negative 02/02/2014 1501   HGBUR SMALL (A) 12/06/2016 1130   BILIRUBINUR NEGATIVE 12/06/2016 1130   BILIRUBINUR Negative 02/02/2014 1501   KETONESUR NEGATIVE 12/06/2016 1130   PROTEINUR NEGATIVE 12/06/2016 1130   NITRITE NEGATIVE  12/06/2016 1130   LEUKOCYTESUR NEGATIVE 12/06/2016 1130   LEUKOCYTESUR Negative 02/02/2014 1501   Sepsis Labs Invalid input(s): PROCALCITONIN,  WBC,  LACTICIDVEN Microbiology No results found for this or any previous visit (from the past 240 hour(s)).   Time coordinating discharge: Over 30 minutes  SIGNED:   Hosie Poisson, MD  [facility] 12/12/2016, 12:12 PM Pager   If 7PM-7AM, please contact night-coverage [email or web address] Password [email or web address]

## 2017-03-01 ENCOUNTER — Emergency Department
Admission: EM | Admit: 2017-03-01 | Discharge: 2017-03-01 | Disposition: A | Discharge status: Home / Self Care | Payer: Medicare Other | Attending: Emergency Medicine | Admitting: Emergency Medicine

## 2017-03-01 ENCOUNTER — Emergency Department: Payer: Medicare Other

## 2017-03-01 ENCOUNTER — Encounter: Payer: Self-pay | Admitting: Emergency Medicine

## 2017-03-01 DIAGNOSIS — R569 Unspecified convulsions: Secondary | ICD-10-CM | POA: Diagnosis present

## 2017-03-01 DIAGNOSIS — Z8673 Personal history of transient ischemic attack (TIA), and cerebral infarction without residual deficits: Secondary | ICD-10-CM | POA: Insufficient documentation

## 2017-03-01 DIAGNOSIS — Z7982 Long term (current) use of aspirin: Secondary | ICD-10-CM | POA: Diagnosis not present

## 2017-03-01 DIAGNOSIS — I10 Essential (primary) hypertension: Secondary | ICD-10-CM | POA: Diagnosis not present

## 2017-03-01 DIAGNOSIS — Z79899 Other long term (current) drug therapy: Secondary | ICD-10-CM | POA: Diagnosis not present

## 2017-03-01 DIAGNOSIS — Z950 Presence of cardiac pacemaker: Secondary | ICD-10-CM | POA: Insufficient documentation

## 2017-03-01 DIAGNOSIS — G40909 Epilepsy, unspecified, not intractable, without status epilepticus: Secondary | ICD-10-CM

## 2017-03-01 LAB — CBC
HEMATOCRIT: 28.2 % — AB (ref 40.0–52.0)
Hemoglobin: 9.7 g/dL — ABNORMAL LOW (ref 13.0–18.0)
MCH: 30.7 pg (ref 26.0–34.0)
MCHC: 34.5 g/dL (ref 32.0–36.0)
MCV: 89 fL (ref 80.0–100.0)
Platelets: 127 10*3/uL — ABNORMAL LOW (ref 150–440)
RBC: 3.16 MIL/uL — AB (ref 4.40–5.90)
RDW: 13.8 % (ref 11.5–14.5)
WBC: 5.8 10*3/uL (ref 3.8–10.6)

## 2017-03-01 LAB — GLUCOSE, CAPILLARY: Glucose-Capillary: 109 mg/dL — ABNORMAL HIGH (ref 65–99)

## 2017-03-01 LAB — BASIC METABOLIC PANEL
Anion gap: 6 (ref 5–15)
BUN: 20 mg/dL (ref 6–20)
CHLORIDE: 105 mmol/L (ref 101–111)
CO2: 24 mmol/L (ref 22–32)
Calcium: 8.3 mg/dL — ABNORMAL LOW (ref 8.9–10.3)
Creatinine, Ser: 1.27 mg/dL — ABNORMAL HIGH (ref 0.61–1.24)
GFR calc non Af Amer: 53 mL/min — ABNORMAL LOW (ref >60)
Glucose, Bld: 109 mg/dL — ABNORMAL HIGH (ref 65–99)
POTASSIUM: 3.7 mmol/L (ref 3.5–5.1)
SODIUM: 135 mmol/L (ref 135–145)

## 2017-03-01 LAB — TROPONIN I: Troponin I: <0.03 ng/mL (ref <0.03)

## 2017-03-01 MED ORDER — SODIUM CHLORIDE 0.9 % IV SOLN: 500.0000 mg | Freq: Once | INTRAVENOUS | Status: DC

## 2017-03-01 MED ORDER — LEVETIRACETAM 500 MG PO TABS
500.0000 mg | ORAL_TABLET | Freq: Once | ORAL | Status: AC
  Administered 2017-03-01: 500 mg via ORAL
  Filled 2017-03-01: qty 1

## 2017-03-01 NOTE — ED Notes (Signed)
Seizure pads placed on either side rail.

## 2017-03-01 NOTE — ED Notes (Signed)
NAD noted at time of D/C. Pt assisted into the wheelchair by this RN. Pt taken to lobby via wheelchair by his family. Pt's family denies any questions/concerns regarding D/C at this time.

## 2017-03-01 NOTE — ED Triage Notes (Signed)
Pt presents to ED via [facility] for a seizure this morning while eating breakfast. Per EMS pt was postictal on arrival, CBG 133. EMS reports that pt was alert and oriented, seizure was witnessed by his wife, wife however was unable to report how long seizure lasted. EMS reports that patient has no complaints, pt is visualized in NAD, resting in bed, alert and oriented, pleasant and cooperative on arrival.

## 2017-03-01 NOTE — ED Notes (Signed)
This RN to bedside, explained delay to patient and family. Pt states understanding. Visualized in NAD at this time. Will continue to monitor for further patient needs.

## 2017-03-01 NOTE — Discharge Instructions (Signed)
You have been seen in the emergency department today for a seizure.  Your workup today including labs are within normal limits.  Please follow up with your doctor/neurologist as soon as possible regarding today's emergency department visit and your likely seizure.  As we have discussed it is very important that you do not drive until you have been seen and cleared by your neurologist. Continue your current dose and use of Keppra.  Please drink plenty of fluids, get plenty of sleep and avoid any alcohol or drug use Please return to the emergency department if you have any further seizures which do not respond to medications, or for any other symptoms per se concerning for yourself.

## 2017-03-01 NOTE — ED Provider Notes (Signed)
[facility] Emergency Department Provider Note  ____________________________________________   First MD Initiated Contact with Patient 03/01/17 1019     (approximate)  I have reviewed the triage vital signs and the nursing notes.   HISTORY  Chief Complaint Seizure   HPI Kenneth Vazquez is a 76 y.o. male the history of seizure disorder.  EMS reports the patient had a witnessed seizure at home today. When they arrived he seemed a postictal, but his mental status has cleared. His blood sugar was normal. He is not complaining of anything with them and is been alert and oriented.  Patient reports that he "went out" while at the breakfast table. He was told he had a seizure by family and paramedics.  Patient reports he has not changes medications doses, he takes Keppra 500 mg twice a day. He reports he has seizures like this about once every 6 months. Spin about 6 months since his last seizure. He is not in any pain or discomfort. Did not injure himself. No numbness weakness or tingling. No trouble speaking. Reports he feels fine right now. In his normal state of health   Past Medical History:  Diagnosis Date  . Arthritis   . Hypertension   . Pacemaker   . Seizures ([place])   . Stroke [facility])     Patient Active Problem List   Diagnosis Date Noted  . Seizures ([place]) 11/27/2016    Past Surgical History:  Procedure Laterality Date  . PACEMAKER INSERTION      Prior to Admission medications   Medication Sig Start Date End Date Taking? Authorizing Provider  aspirin EC 81 MG tablet Take 81 mg by mouth daily.    [provider]  carvedilol (COREG) 12.5 MG tablet Take 12.5 mg by mouth 2 (two) times daily.    [provider]  donepezil (ARICEPT) 10 MG tablet Take 1 tablet (10 mg total) by mouth every morning. 11/30/16   Hosie Poisson, MD  Ferrous Gluconate (IRON 27 PO) Take 1 tablet by mouth daily.    [provider]  furosemide  (LASIX) 20 MG tablet Take 20 mg by mouth daily.    [provider]  levETIRAcetam (KEPPRA) 750 MG tablet Take 1 tablet (750 mg total) by mouth 2 (two) times daily. 11/29/16   Hosie Poisson, MD  MYRBETRIQ 25 MG TB24 tablet Take 1 tablet by mouth daily. 11/13/16   [provider]  simvastatin (ZOCOR) 20 MG tablet Take 1 tablet by mouth at bedtime. 10/14/15   [provider]  terazosin (HYTRIN) 2 MG capsule Take 2 mg by mouth at bedtime.     [provider]  traZODone (DESYREL) 50 MG tablet Take 50-100 mg by mouth at bedtime.    [provider]    Allergies Sulfa antibiotics  No family history on file.  Social History Social History  Substance Use Topics  . Smoking status: Never Smoker  . Smokeless tobacco: Never Used  . Alcohol use No    Review of Systems Constitutional: No fever/chills Eyes: No visual changes. ENT: No sore throat. Cardiovascular: Denies chest pain. Respiratory: Denies shortness of breath. Gastrointestinal: No abdominal pain.  No nausea, no vomiting.  No diarrhea.  No constipation. Genitourinary: Negative for dysuria. Musculoskeletal: Negative for back pain. Skin: Negative for rash. Neurological: Negative for headaches, focal weakness or numbness.    ____________________________________________   PHYSICAL EXAM:  VITAL SIGNS: ED Triage Vitals  Enc Vitals Group     BP  Pulse      Resp      Temp      Temp src      SpO2      Weight      Height      Head Circumference      Peak Flow      Pain Score      Pain Loc      Pain Edu?      Excl. in [place]?    Constitutional: Alert and oriented. Well appearing and in no acute distress. Eyes: Conjunctivae are normal. Head: Atraumatic. Nose: No congestion/rhinnorhea. Mouth/Throat: Mucous membranes are moist. Neck: No stridor.  No cervical spine tenderness. Cardiovascular: Normal rate, regular rhythm. Grossly normal heart sounds.  Good peripheral  circulation. Respiratory: Normal respiratory effort.  No retractions. Lungs CTAB. Gastrointestinal: Soft and nontender. No distention. Musculoskeletal: No lower extremity tenderness nor edema. Normal range of motion of the upper and lower extremities without pain or discomfort. No shoulder dislocations. Neurologic:  Normal speech and language. No gross focal neurologic deficits are appreciated.  Skin:  Skin is warm, dry and intact. No rash noted. Psychiatric: Mood and affect are normal. Speech and behavior are normal. ____________________________________________   LABS (all labs ordered are listed, but only abnormal results are displayed)  Labs Reviewed  BASIC METABOLIC PANEL - Abnormal; Notable for the following:       Result Value   Glucose, Bld 109 (*)    Creatinine, Ser 1.27 (*)    Calcium 8.3 (*)    GFR calc non Af Amer 53 (*)    All other components within normal limits  CBC - Abnormal; Notable for the following:    RBC 3.16 (*)    Hemoglobin 9.7 (*)    HCT 28.2 (*)    Platelets 127 (*)    All other components within normal limits  GLUCOSE, CAPILLARY - Abnormal; Notable for the following:    Glucose-Capillary 109 (*)    All other components within normal limits  TROPONIN I  CBG MONITORING, ED   ____________________________________________  EKG  Reviewed and interpreted by me and 10:20 AM Heart rate 75 QRS 110 QTC 500 Atrial paced rhythm, Notable T-wave inversions in inferior oh lateral distribution, also some slight ST elevation in V2  As compared with the patient's previous EKG from 11/27/2016, I see no significant change area perhaps some slight increased prominence of T-wave inversions in V5 and V6. Of note the patient is not complaining of any chest symptoms. No chest pain or trouble breathing. Presently asymptomatic ____________________________________________  RADIOLOGY  CT imaging does not appear to be indicated. The patient has established history of  seizure disorder and is now returned to mental baseline without a prolonged postictal state. He is not complaining of any neurologic deficits or headache. Not on any anticoagulants.  Dg Shoulder Left  Result Date: 03/01/2017 CLINICAL DATA:  Multiple falls. Seizure this morning. Left shoulder pain. EXAM: LEFT SHOULDER - 2+ VIEW COMPARISON:  None. FINDINGS: No fracture or dislocation. No bone lesion. Bones are demineralized. There is significant narrowing of the subacromial space consistent with a chronic full-thickness rotator cuff tear. AC joint is normally aligned with minor osteoarthritic changes. Soft tissues are unremarkable. IMPRESSION: 1. No fracture or dislocation. 2. Narrow subacromial space consistent with a chronic full-thickness rotator cuff tear. Electronically Signed   By: Lajean Manes M.D.   On: 03/01/2017 12:14    ____________________________________________   PROCEDURES  Procedure(s) performed: None  Procedures  Critical  Care performed: No  ____________________________________________   INITIAL IMPRESSION / ASSESSMENT AND PLAN / ED COURSE  Pertinent labs & imaging results that were available during my care of the patient were reviewed by me and considered in my medical decision making (see chart for details).      Seizure. Single, witnessed seizure. History of seizure disorder. Asymptomatic at ED. No new medication changes. Reassuring examination. We'll give him an additional dose of Keppra here  ----------------------------------------- 12:00 PM on 03/01/2017 -----------------------------------------  Patient is awake alert. No distress. Reports his left shoulder feels slightly achy at this time, and has minimal tenderness across the anterior region now without evidence of obvious injury. Ranges well.  RIGHT Right upper extremity demonstrates normal strength, good use of all muscles. No edema bruising or contusions of the right shoulder/upper arm, right elbow,  right forearm / hand. Full range of motion of the right right upper extremity without pain. No evidence of trauma. Strong radial pulse. Intact median/ulnar/radial neuro-muscular exam.  LEFT Left upper extremity demonstrates normal strength, good use of all muscles. No edema bruising or contusions of the left shoulder/upper arm, left elbow, left forearm / hand. Full range of motion of the left  upper extremity without pain. No evidence of trauma. Strong radial pulse. Intact median/ulnar/radial neuro-muscular exam.  We'll obtain x-ray. Discussed with patient and his family, plan to continue him on his current Keppra dose. Discussed case with Dr. Irish Elders of neurology who recommends the patient continue on his current Keppra dosing without change given his seizure interval has been several months. Patient will follow-up closely with his primary doctor this week.  Return precautions and treatment recommendations and follow-up discussed with the patient who is agreeable with the plan.  ____________________________________________   FINAL CLINICAL IMPRESSION(S) / ED DIAGNOSES  Final diagnoses:  Seizure disorder ([place])      NEW MEDICATIONS STARTED DURING THIS VISIT:  New Prescriptions   No medications on file     Note:  This document was prepared using Dragon voice recognition software and may include unintentional dictation errors.     Delman Kitten, MD 03/01/17 1326

## 2017-03-31 ENCOUNTER — Emergency Department (HOSPITAL_COMMUNITY): Payer: Medicare Other

## 2017-03-31 ENCOUNTER — Encounter (HOSPITAL_COMMUNITY): Payer: Self-pay

## 2017-03-31 ENCOUNTER — Inpatient Hospital Stay (HOSPITAL_COMMUNITY)
Admission: EM | Admit: 2017-03-31 | Discharge: 2017-04-07 | DRG: 065 | Disposition: A | Discharge status: Skilled Nursing Facility | Payer: Medicare Other | Attending: Internal Medicine | Admitting: Internal Medicine

## 2017-03-31 ENCOUNTER — Other Ambulatory Visit: Payer: Self-pay | Admitting: Internal Medicine

## 2017-03-31 DIAGNOSIS — R4781 Slurred speech: Secondary | ICD-10-CM | POA: Diagnosis present

## 2017-03-31 DIAGNOSIS — Z882 Allergy status to sulfonamides status: Secondary | ICD-10-CM

## 2017-03-31 DIAGNOSIS — I6789 Other cerebrovascular disease: Secondary | ICD-10-CM | POA: Diagnosis present

## 2017-03-31 DIAGNOSIS — M1990 Unspecified osteoarthritis, unspecified site: Secondary | ICD-10-CM | POA: Diagnosis present

## 2017-03-31 DIAGNOSIS — Z6822 Body mass index (BMI) 22.0-22.9, adult: Secondary | ICD-10-CM

## 2017-03-31 DIAGNOSIS — I471 Supraventricular tachycardia: Secondary | ICD-10-CM | POA: Diagnosis not present

## 2017-03-31 DIAGNOSIS — Z8546 Personal history of malignant neoplasm of prostate: Secondary | ICD-10-CM

## 2017-03-31 DIAGNOSIS — E785 Hyperlipidemia, unspecified: Secondary | ICD-10-CM | POA: Diagnosis present

## 2017-03-31 DIAGNOSIS — W19XXXA Unspecified fall, initial encounter: Secondary | ICD-10-CM | POA: Diagnosis present

## 2017-03-31 DIAGNOSIS — E43 Unspecified severe protein-calorie malnutrition: Secondary | ICD-10-CM | POA: Diagnosis present

## 2017-03-31 DIAGNOSIS — I4891 Unspecified atrial fibrillation: Secondary | ICD-10-CM | POA: Diagnosis present

## 2017-03-31 DIAGNOSIS — R569 Unspecified convulsions: Secondary | ICD-10-CM | POA: Diagnosis not present

## 2017-03-31 DIAGNOSIS — I63412 Cerebral infarction due to embolism of left middle cerebral artery: Secondary | ICD-10-CM

## 2017-03-31 DIAGNOSIS — Y92013 Bedroom of single-family (private) house as the place of occurrence of the external cause: Secondary | ICD-10-CM

## 2017-03-31 DIAGNOSIS — I639 Cerebral infarction, unspecified: Principal | ICD-10-CM | POA: Diagnosis present

## 2017-03-31 DIAGNOSIS — F0150 Vascular dementia without behavioral disturbance: Secondary | ICD-10-CM | POA: Diagnosis present

## 2017-03-31 DIAGNOSIS — D649 Anemia, unspecified: Secondary | ICD-10-CM | POA: Diagnosis present

## 2017-03-31 DIAGNOSIS — D696 Thrombocytopenia, unspecified: Secondary | ICD-10-CM | POA: Diagnosis present

## 2017-03-31 DIAGNOSIS — Z950 Presence of cardiac pacemaker: Secondary | ICD-10-CM

## 2017-03-31 DIAGNOSIS — I69351 Hemiplegia and hemiparesis following cerebral infarction affecting right dominant side: Secondary | ICD-10-CM | POA: Diagnosis not present

## 2017-03-31 DIAGNOSIS — G40909 Epilepsy, unspecified, not intractable, without status epilepticus: Secondary | ICD-10-CM | POA: Diagnosis present

## 2017-03-31 DIAGNOSIS — F0390 Unspecified dementia without behavioral disturbance: Secondary | ICD-10-CM | POA: Diagnosis present

## 2017-03-31 DIAGNOSIS — T82856A Stenosis of peripheral vascular stent, initial encounter: Secondary | ICD-10-CM | POA: Diagnosis present

## 2017-03-31 DIAGNOSIS — Z823 Family history of stroke: Secondary | ICD-10-CM | POA: Diagnosis not present

## 2017-03-31 DIAGNOSIS — E440 Moderate protein-calorie malnutrition: Secondary | ICD-10-CM | POA: Insufficient documentation

## 2017-03-31 DIAGNOSIS — R29716 NIHSS score 16: Secondary | ICD-10-CM | POA: Diagnosis not present

## 2017-03-31 DIAGNOSIS — Z66 Do not resuscitate: Secondary | ICD-10-CM | POA: Diagnosis present

## 2017-03-31 DIAGNOSIS — R1310 Dysphagia, unspecified: Secondary | ICD-10-CM

## 2017-03-31 DIAGNOSIS — Z96652 Presence of left artificial knee joint: Secondary | ICD-10-CM | POA: Diagnosis not present

## 2017-03-31 DIAGNOSIS — G459 Transient cerebral ischemic attack, unspecified: Secondary | ICD-10-CM

## 2017-03-31 DIAGNOSIS — Y838 Other surgical procedures as the cause of abnormal reaction of the patient, or of later complication, without mention of misadventure at the time of the procedure: Secondary | ICD-10-CM | POA: Diagnosis not present

## 2017-03-31 DIAGNOSIS — R29810 Facial weakness: Secondary | ICD-10-CM | POA: Diagnosis not present

## 2017-03-31 DIAGNOSIS — Z7982 Long term (current) use of aspirin: Secondary | ICD-10-CM

## 2017-03-31 DIAGNOSIS — I10 Essential (primary) hypertension: Secondary | ICD-10-CM | POA: Diagnosis present

## 2017-03-31 DIAGNOSIS — I351 Nonrheumatic aortic (valve) insufficiency: Secondary | ICD-10-CM | POA: Diagnosis not present

## 2017-03-31 HISTORY — DX: Peripheral vascular disease, unspecified: I73.9

## 2017-03-31 HISTORY — DX: Malignant neoplasm of prostate: C61

## 2017-03-31 HISTORY — DX: Unspecified dementia, unspecified severity, without behavioral disturbance, psychotic disturbance, mood disturbance, and anxiety: F03.90

## 2017-03-31 HISTORY — DX: Disorder of arteries and arterioles, unspecified: I77.9

## 2017-03-31 LAB — COMPREHENSIVE METABOLIC PANEL
ALBUMIN: 2.5 g/dL — AB (ref 3.5–5.0)
ALT: 12 U/L — ABNORMAL LOW (ref 17–63)
AST: 20 U/L (ref 15–41)
Alkaline Phosphatase: 40 U/L (ref 38–126)
Anion gap: 7 (ref 5–15)
BUN: 16 mg/dL (ref 6–20)
CHLORIDE: 104 mmol/L (ref 101–111)
CO2: 25 mmol/L (ref 22–32)
Calcium: 8.4 mg/dL — ABNORMAL LOW (ref 8.9–10.3)
Creatinine, Ser: 0.79 mg/dL (ref 0.61–1.24)
GFR calc Af Amer: >60 mL/min (ref >60)
GFR calc non Af Amer: >60 mL/min (ref >60)
GLUCOSE: 100 mg/dL — AB (ref 65–99)
POTASSIUM: 3.8 mmol/L (ref 3.5–5.1)
SODIUM: 136 mmol/L (ref 135–145)
Total Bilirubin: 0.7 mg/dL (ref 0.3–1.2)
Total Protein: 6.6 g/dL (ref 6.5–8.1)

## 2017-03-31 LAB — PROTIME-INR
INR: 1.15
Prothrombin Time: 14.8 seconds (ref 11.4–15.2)

## 2017-03-31 LAB — CBC
HCT: 26.3 % — ABNORMAL LOW (ref 39.0–52.0)
Hemoglobin: 9.3 g/dL — ABNORMAL LOW (ref 13.0–17.0)
MCH: 30.3 pg (ref 26.0–34.0)
MCHC: 35.4 g/dL (ref 30.0–36.0)
MCV: 85.7 fL (ref 78.0–100.0)
Platelets: 136 10*3/uL — ABNORMAL LOW (ref 150–400)
RBC: 3.07 MIL/uL — ABNORMAL LOW (ref 4.22–5.81)
RDW: 14.9 % (ref 11.5–15.5)
WBC: 4.5 10*3/uL (ref 4.0–10.5)

## 2017-03-31 LAB — DIFFERENTIAL
BASOS ABS: 0 10*3/uL (ref 0.0–0.1)
Basophils Relative: 0 %
EOS ABS: 0.2 10*3/uL (ref 0.0–0.7)
Eosinophils Relative: 5 %
Lymphocytes Relative: 20 %
Lymphs Abs: 0.9 10*3/uL (ref 0.7–4.0)
Monocytes Absolute: 0.3 10*3/uL (ref 0.1–1.0)
Monocytes Relative: 7 %
NEUTROS PCT: 68 %
Neutro Abs: 3 10*3/uL (ref 1.7–7.7)

## 2017-03-31 LAB — RAPID URINE DRUG SCREEN, HOSP PERFORMED
AMPHETAMINES: NOT DETECTED
Barbiturates: NOT DETECTED
Benzodiazepines: NOT DETECTED
Cocaine: NOT DETECTED
OPIATES: NOT DETECTED
TETRAHYDROCANNABINOL: NOT DETECTED

## 2017-03-31 LAB — ETHANOL

## 2017-03-31 LAB — APTT: APTT: 27 s (ref 24–36)

## 2017-03-31 LAB — TROPONIN I

## 2017-03-31 MED ORDER — SODIUM CHLORIDE 0.9 % IV SOLN
INTRAVENOUS | Status: DC
  Administered 2017-03-31: 21:00:00 via INTRAVENOUS
  Administered 2017-04-01: 1000 mL via INTRAVENOUS
  Administered 2017-04-01: 01:00:00 via INTRAVENOUS
  Administered 2017-04-02: 1000 mL via INTRAVENOUS
  Administered 2017-04-04 (×2): via INTRAVENOUS

## 2017-03-31 MED ORDER — SODIUM CHLORIDE 0.9 % IV SOLN
INTRAVENOUS | Status: DC
  Administered 2017-03-31: 15:00:00 via INTRAVENOUS

## 2017-03-31 MED ORDER — SODIUM CHLORIDE 0.9 % IV SOLN
250.0000 mg | Freq: Once | INTRAVENOUS | Status: AC
  Administered 2017-04-01: 250 mg via INTRAVENOUS
  Filled 2017-03-31: qty 2.5

## 2017-03-31 MED ORDER — SODIUM CHLORIDE 0.9 % IV SOLN
500.0000 mg | Freq: Two times a day (BID) | INTRAVENOUS | Status: DC
  Administered 2017-03-31: 500 mg via INTRAVENOUS
  Filled 2017-03-31: qty 5

## 2017-03-31 MED ORDER — ENOXAPARIN SODIUM 40 MG/0.4ML ~~LOC~~ SOLN
40.0000 mg | SUBCUTANEOUS | Status: DC
  Filled 2017-03-31: qty 0.4

## 2017-03-31 MED ORDER — DONEPEZIL HCL 10 MG PO TABS
10.0000 mg | ORAL_TABLET | Freq: Every day | ORAL | Status: DC
  Administered 2017-04-01 – 2017-04-06 (×6): 10 mg via ORAL
  Filled 2017-03-31 (×7): qty 1

## 2017-03-31 MED ORDER — LEVETIRACETAM 500 MG/5ML IV SOLN
750.0000 mg | Freq: Two times a day (BID) | INTRAVENOUS | Status: DC
  Administered 2017-04-01 – 2017-04-04 (×7): 750 mg via INTRAVENOUS
  Filled 2017-03-31 (×7): qty 7.5

## 2017-03-31 MED ORDER — ACETAMINOPHEN 325 MG PO TABS: 650.0000 mg | ORAL_TABLET | ORAL | Status: DC | PRN

## 2017-03-31 MED ORDER — STROKE: EARLY STAGES OF RECOVERY BOOK
Freq: Once | Status: AC
Start: 2017-03-31 — End: 2017-03-31
  Administered 2017-03-31: 21:00:00
  Filled 2017-03-31: qty 1

## 2017-03-31 MED ORDER — ASPIRIN 300 MG RE SUPP
300.0000 mg | Freq: Every day | RECTAL | Status: DC
  Administered 2017-03-31 – 2017-04-01 (×2): 300 mg via RECTAL
  Filled 2017-03-31 (×2): qty 1

## 2017-03-31 MED ORDER — ACETAMINOPHEN 650 MG RE SUPP: 650.0000 mg | RECTAL | Status: DC | PRN

## 2017-03-31 MED ORDER — ACETAMINOPHEN 160 MG/5ML PO SOLN: 650.0000 mg | ORAL | Status: DC | PRN

## 2017-03-31 MED ORDER — ASPIRIN 325 MG PO TABS: 325.0000 mg | ORAL_TABLET | Freq: Every day | ORAL | Status: DC

## 2017-03-31 NOTE — ED Notes (Signed)
Bladder Scan: 304

## 2017-03-31 NOTE — Consult Note (Signed)
Neurology Consultation Reason for Consult: Right-sided weakness Referring Physician: Thomasenia Bottoms  CC: Right-sided weakness  History is obtained from: Patient, chart  HPI: Kenneth Vazquez is a 76 y.o. male with a history of seizure disorder, stroke with residual right-sided weakness who was in his normal state of health until last Friday when his right side became weaker. Today he fell, and his wife called his neurologist who told her to take him to the emergency department. In the ER and [facility] there is concern for stroke and he was transferred to [facility].  Unfortunately, history is limited by lack of family and poor historian of this patient. He apparently has a history of dementia and he sometimes "sneaks out of the bed" without using his walker. She heard him fall in the middle the night on Friday night, and he has slurred speech since then. She states that he has had persistent deficit since that time.  Though unclear exactly what his baseline is, at least in March when Dr. Merlene Laughter saw him at [facility] he had relatively symmetric strength and a relatively symmetric face.  LKW: Friday prior to bed tpa given?: no, outside of window    ROS: A 14 point ROS was performed and is negative except as noted in the HPI.  Past Medical History:  Diagnosis Date  . Arthritis   . Carotid artery disease ([place])   . Dementia   . Hypertension   . Pacemaker   . Prostate cancer ([place])   . Seizures ([place])    last seizure was a couple of weeks ago  . Stroke [facility]) x2, 2015   residual weakness, wife is unsure which side     Family History  Problem Relation Age of Onset  . CVA Mother      Social History:  reports that he has never smoked. He has never used smokeless tobacco. He reports that he does not drink alcohol or use drugs.   Exam: Current vital signs: BP (!) 187/88 (BP Location: Right Arm)   Pulse 81   Temp 97.8 F (36.6 C)   Resp 18   Wt 72.6 kg (160 lb)   SpO2 95%   BMI  23.63 kg/m  Vital signs in last 24 hours: Temp:  [97.6 F (36.4 C)-98.7 F (37.1 C)] 97.8 F (36.6 C) (07/30 2205) Pulse Rate:  [57-93] 81 (07/30 2205) Resp:  [12-18] 18 (07/30 2205) BP: (129-192)/(78-98) 187/88 (07/30 2205) SpO2:  [77 %-100 %] 95 % (07/30 2206) Weight:  [72.6 kg (160 lb)] 72.6 kg (160 lb) (07/30 1156)   Physical Exam  Constitutional: Appears Elderly Psych: Affect appropriate to situation Eyes: No scleral injection HENT: No OP obstrucion Head: Normocephalic.  Cardiovascular: Normal rate and regular rhythm.  Respiratory: Effort normal and breath sounds normal to anterior ascultation GI: Soft.  No distension. There is no tenderness.  Skin: WDI  Neuro: Mental Status: Patient is awake, alert, he is oriented to person only, he has some difficulty with following even slightly complex commands, but is able to answer simple questions and name simple objects. Patient is able to give a clear and coherent history. No signs of neglect. He has slow deliberate responses Cranial Nerves: II: Visual Fields are full. He has a postsurgical pupil on the right, round and reactive on the left III,IV, VI: EOMI without ptosis or diploplia.  V: Facial sensation is decreased on the right VII: Facial movement is decreased on the right VIII: hearing is intact to voice X:  Uvula elevates symmetrically XI: Shoulder shrug is symmetric. XII: tongue is midline without atrophy or fasciculations.  Motor: Tone is normal. Bulk is normal. 5/5 strength was present on the left side, he has 4/5 strength in the right arm and leg Sensory: Sensation is diminished throughout the right side Cerebellar: He does not perform   I have reviewed labs in epic and the results pertinent to this consultation are: CMP-unremarkable  I have reviewed the images obtained: CT head-unremarkable  Impression: 76 year old male with acute worsening of his right-sided weakness for the past 2 days, strongly  suspicious for CVA. He has been admitted for further evaluation. Per the last note from [facility] clinic in March, the plan had been to continue Keppra 750 mg twice a day, I will continue at this dose.  Recommendations: 1. HgbA1c, fasting lipid panel 2. MRI, MRA  of the brain without contrast 3. Frequent neuro checks 4. Echocardiogram 5. Carotid dopplers 6. Prophylactic therapy-Antiplatelet med: Aspirin - dose 325mg  PO or 300mg  PR 7. Risk factor modification 8. Telemetry monitoring 9. PT consult, OT consult, Speech consult 10. please page stroke NP  Or  PA  Or MD  from 8am -4 pm as this patient will be followed by the stroke team at this point.   You can look them up on [email or web address]   11. Keppra 750 twice a day 12. Continue Aricept   Roland Rack, MD [facility] [phone]  If 7pm- 7am, please page neurology on call as listed in [place].

## 2017-03-31 NOTE — ED Notes (Addendum)
In and out catheter performed by jennifer k, rn and Duc Crocket m, rn. Unable to obtain urine.  PT wife states he has prostate problems, we are unable to advance past prostate at this time.

## 2017-03-31 NOTE — ED Provider Notes (Signed)
[facility] DEPT Provider Note   CSN: [id number] Arrival date & time: 03/31/17  1153     History   Chief Complaint Chief Complaint  Patient presents with  . Weakness    HPI Kenneth Vazquez is a 76 y.o. male.  The history is provided by a caregiver and the patient. The history is limited by the condition of the patient (Hx dementia, slurred speech).  Weakness   Pt was seen at 1210. Per pt's wife:  Pt fell while getting out of bed 2 days ago and since then pt's speech has been "different" and his legs have been "weak." Denies syncope, no fevers, no vomiting/diarrhea. Pt has significant hx of dementia.   Past Medical History:  Diagnosis Date  . Arthritis   . Dementia   . Hypertension   . Pacemaker   . Seizures ([place])   . Stroke [facility])     Patient Active Problem List   Diagnosis Date Noted  . Seizures ([place]) 11/27/2016    Past Surgical History:  Procedure Laterality Date  . PACEMAKER INSERTION         Home Medications    Prior to Admission medications   Medication Sig Start Date End Date Taking? Authorizing Provider  aspirin EC 81 MG tablet Take 81 mg by mouth daily.   Yes [provider]  docusate sodium (COLACE) 100 MG capsule Take 100 mg by mouth daily.   Yes [provider]  donepezil (ARICEPT) 10 MG tablet Take 1 tablet (10 mg total) by mouth every morning. 11/30/16  Yes Hosie Poisson, MD  Ferrous Gluconate (IRON 27 PO) Take 1 tablet by mouth daily.   Yes [provider]  furosemide (LASIX) 20 MG tablet Take 20 mg by mouth daily.   Yes [provider]  levETIRAcetam (KEPPRA) 500 MG tablet Take 500 mg by mouth 2 (two) times daily.   Yes [provider]  MYRBETRIQ 25 MG TB24 tablet Take 1 tablet by mouth daily. 11/13/16  Yes [provider]  traZODone (DESYREL) 50 MG tablet Take 50-100 mg by mouth at bedtime.   Yes [provider]  levETIRAcetam (KEPPRA) 750 MG tablet Take 1 tablet (750 mg total) by  mouth 2 (two) times daily. Patient not taking: Reported on 03/31/2017 11/29/16   Hosie Poisson, MD    Family History No family history on file.  Social History Social History  Substance Use Topics  . Smoking status: Never Smoker  . Smokeless tobacco: Never Used  . Alcohol use No     Allergies   Sulfa antibiotics   Review of Systems Review of Systems  Unable to perform ROS: Dementia  Neurological: Positive for weakness.     Physical Exam Updated Vital Signs BP 129/82   Pulse (!) 59   Temp 98.4 F (36.9 C)   Resp 13   Wt 72.6 kg (160 lb)   SpO2 98%   BMI 23.63 kg/m   Physical Exam 1215: Physical examination:  Nursing notes reviewed; Vital signs and O2 SAT reviewed;  Constitutional: Well developed, Well nourished, In no acute distress; Head:  Normocephalic, atraumatic; Eyes: EOMI, PERRL, No scleral icterus; ENMT: Mouth and pharynx normal, Mucous membranes dry; Neck: Supple, Full range of motion, No lymphadenopathy; Cardiovascular: Regular rate and rhythm, No gallop; Respiratory: Breath sounds clear & equal bilaterally, No wheezes.  Normal respiratory effort/excursion; Chest: Nontender, Movement normal; Abdomen: Soft, Nontender, Nondistended, Normal bowel sounds; Genitourinary: No CVA tenderness; Extremities: Pulses normal, No tenderness, No edema, No calf  edema or asymmetry.; Neuro: Awake, alert. Speech slow and garbled. +right facial droop, +R<L grip, +RUE strength 4/5, RLE strength 1/5. LUE strength 5/5, LLE strength 2/5.; Skin: Color normal, Warm, Dry.   ED Treatments / Results  Labs (all labs ordered are listed, but only abnormal results are displayed)   EKG  EKG Interpretation  Date/Time:  Monday March 31 2017 11:54:05 EDT Ventricular Rate:  67 PR Interval:    QRS Duration: 112 QT Interval:  441 QTC Calculation: 476 R Axis:   -68 Text Interpretation:  Atrial-paced complexes Abnormal R-wave progression, early transition LVH with IVCD, LAD and secondary repol  abnrm Borderline prolonged QT interval When compared with ECG of 11/27/2016 atrial-paced complexes are now present Confirmed by [facility]  MD, Nunzio Cory [phone]) on 03/31/2017 1:11:51 PM       Radiology   Procedures Procedures (including critical care time)  Medications Ordered in ED Medications  0.9 %  sodium chloride infusion (not administered)     Initial Impression / Assessment and Plan / ED Course  I have reviewed the triage vital signs and the nursing notes.  Pertinent labs & imaging results that were available during my care of the patient were reviewed by me and considered in my medical decision making (see chart for details).  MDM Reviewed: previous chart, nursing note and vitals Reviewed previous: labs and ECG Interpretation: labs, ECG, x-ray and CT scan    Results for orders placed or performed during the hospital encounter of 03/31/17  Ethanol  Result Value Ref Range   Alcohol, Ethyl (B) <5 <5 mg/dL  Protime-INR  Result Value Ref Range   Prothrombin Time 14.8 11.4 - 15.2 seconds   INR 1.15   APTT  Result Value Ref Range   aPTT 27 24 - 36 seconds  CBC  Result Value Ref Range   WBC 4.5 4.0 - 10.5 K/uL   RBC 3.07 (L) 4.22 - 5.81 MIL/uL   Hemoglobin 9.3 (L) 13.0 - 17.0 g/dL   HCT 26.3 (L) 39.0 - 52.0 %   MCV 85.7 78.0 - 100.0 fL   MCH 30.3 26.0 - 34.0 pg   MCHC 35.4 30.0 - 36.0 g/dL   RDW 14.9 11.5 - 15.5 %   Platelets 136 (L) 150 - 400 K/uL  Differential  Result Value Ref Range   Neutrophils Relative % 68 %   Neutro Abs 3.0 1.7 - 7.7 K/uL   Lymphocytes Relative 20 %   Lymphs Abs 0.9 0.7 - 4.0 K/uL   Monocytes Relative 7 %   Monocytes Absolute 0.3 0.1 - 1.0 K/uL   Eosinophils Relative 5 %   Eosinophils Absolute 0.2 0.0 - 0.7 K/uL   Basophils Relative 0 %   Basophils Absolute 0.0 0.0 - 0.1 K/uL  Comprehensive metabolic panel  Result Value Ref Range   Sodium 136 135 - 145 mmol/L   Potassium 3.8 3.5 - 5.1 mmol/L   Chloride 104 101 - 111 mmol/L   CO2  25 22 - 32 mmol/L   Glucose, Bld 100 (H) 65 - 99 mg/dL   BUN 16 6 - 20 mg/dL   Creatinine, Ser 0.79 0.61 - 1.24 mg/dL   Calcium 8.4 (L) 8.9 - 10.3 mg/dL   Total Protein 6.6 6.5 - 8.1 g/dL   Albumin 2.5 (L) 3.5 - 5.0 g/dL   AST 20 15 - 41 U/L   ALT 12 (L) 17 - 63 U/L   Alkaline Phosphatase 40 38 - 126 U/L   Total Bilirubin 0.7  0.3 - 1.2 mg/dL   GFR calc non Af Amer >60 >60 mL/min   GFR calc Af Amer >60 >60 mL/min   Anion gap 7 5 - 15  Troponin I  Result Value Ref Range   Troponin I <0.03 <0.03 ng/mL   Dg Chest 1 View Result Date: 03/31/2017 CLINICAL DATA:  76 year old male post fall 2 days ago. Right-sided weakness and slurred speech. History stroke. Initial encounter. EXAM: CHEST 1 VIEW COMPARISON:  11/27/2016. FINDINGS: Sequential pacemaker enters from the left with leads unchanged in position appearing to be in the region of the right atrium and right ventricle. Cardiomegaly. Left base subsegmental atelectasis. No pulmonary edema or pneumothorax. Calcified slightly tortuous aorta. Bilateral acromioclavicular joint degenerative changes. No obvious rib fracture. IMPRESSION: Cardiomegaly with pacemaker in place. Left base subsegmental atelectasis. Electronically Signed   By: Genia Del M.D.   On: 03/31/2017 14:23   Ct Head Wo Contrast Result Date: 03/31/2017 CLINICAL DATA:  Pain following fall. Slurred speech. Left mouth droop. Left-sided weakness EXAM: CT HEAD WITHOUT CONTRAST CT CERVICAL SPINE WITHOUT CONTRAST TECHNIQUE: Multidetector CT imaging of the head and cervical spine was performed following the standard protocol without intravenous contrast. Multiplanar CT image reconstructions of the cervical spine were also generated. COMPARISON:  Head CT November 27, 2016 FINDINGS: CT HEAD FINDINGS Brain: There is mild diffuse atrophy, stable. There is no intracranial mass, hemorrhage, extra-axial fluid collection, or midline shift. There is patchy small vessel disease in the centra semiovale  bilaterally. There is a prior lacunar infarct in the right internal capsule just anterior to the genu. There is evidence of a prior small lacunar infarct in the head of the caudate nucleus on the right. There is evidence of a prior small infarct in the superior left cerebellum. There is no new gray-white compartment lesion. No acute infarct evident. Vascular: There is no appreciable hyperdense vessel. There is calcification in each carotid siphon region. Skull: The bony calvarium appears intact. There is a minimal left superior frontal exostosis, benign and stable. Sinuses/Orbits: There is opacification in multiple ethmoid air cells bilaterally. No intraorbital lesions are identified. Patient appears to have had previous cataract removal on the right, stable. Other: Mastoid air cells are clear. CT CERVICAL SPINE FINDINGS Alignment:  There is no appreciable spondylolisthesis. Skull base and vertebrae: Skull base and craniocervical junction regions appear normal. No evident fracture. No blastic or lytic bone lesions. Soft tissues and spinal canal: Prevertebral soft tissues and predental space regions are normal. No paraspinous lesion. No cord or canal hematoma. Disc levels: There is moderately severe disc space narrowing at C4-5 and C6-7. There is moderate disc space narrowing at C3-4, C5-6, and C7-T1. There are anterior osteophytes at all levels except for C2. There is facet hypertrophy at multiple levels. There is exit foraminal narrowing with impression on the exiting nerve root on the left at C3-4, on the left at C4-5, and on the left at C6-7. Milder exit foraminal narrowing is noted at C5-6 and C6-7 on the right. No frank disc extrusion or high-grade stenosis. Upper chest: Visualized lung apical regions are clear. Other: There is a stent in the right carotid artery. IMPRESSION: CT head: Stable atrophy with fairly extensive supratentorial small vessel disease, stable. Prior lacunar infarcts noted in the right  basal ganglia and left cerebellum. No acute infarct. No hemorrhage, mass, or extra-axial fluid. Foci of arterial vascular calcification noted. Opacification in multiple ethmoid air cells. CT cervical spine: No fracture or spondylolisthesis. 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 There is a stent in the right carotid artery. Electronically Signed   By: Lowella Grip III M.D.   On: 03/31/2017 14:21   Ct Cervical Spine Wo Contrast Result Date: 03/31/2017 CLINICAL DATA:  Pain following fall. Slurred speech. Left mouth droop. Left-sided weakness EXAM: CT HEAD WITHOUT CONTRAST CT CERVICAL SPINE WITHOUT CONTRAST TECHNIQUE: Multidetector CT imaging of the head and cervical spine was performed following the standard protocol without intravenous contrast. Multiplanar CT image reconstructions of the cervical spine were also generated. COMPARISON:  Head CT November 27, 2016 FINDINGS: CT HEAD FINDINGS Brain: There is mild diffuse atrophy, stable. There is no intracranial mass, hemorrhage, extra-axial fluid collection, or midline shift. There is patchy small vessel disease in the centra semiovale bilaterally. There is a prior lacunar infarct in the right internal capsule just anterior to the genu. There is evidence of a prior small lacunar infarct in the head of the caudate nucleus on the right. There is evidence of a prior small infarct in the superior left cerebellum. There is no new gray-white compartment lesion. No acute infarct evident. Vascular: There is no appreciable hyperdense vessel. There is calcification in each carotid siphon region. Skull: The bony calvarium appears intact. There is a minimal left superior frontal exostosis, benign and stable. Sinuses/Orbits: There is opacification in multiple ethmoid air cells bilaterally. No intraorbital lesions are identified. Patient appears to have had previous cataract removal on the right, stable. Other: Mastoid air cells are clear. CT CERVICAL SPINE FINDINGS  Alignment:  There is no appreciable spondylolisthesis. Skull base and vertebrae: Skull base and craniocervical junction regions appear normal. No evident fracture. No blastic or lytic bone lesions. Soft tissues and spinal canal: Prevertebral soft tissues and predental space regions are normal. No paraspinous lesion. No cord or canal hematoma. Disc levels: There is moderately severe disc space narrowing at C4-5 and C6-7. There is moderate disc space narrowing at C3-4, C5-6, and C7-T1. There are anterior osteophytes at all levels except for C2. There is facet hypertrophy at multiple levels. There is exit foraminal narrowing with impression on the exiting nerve root on the left at C3-4, on the left at C4-5, and on the left at C6-7. Milder exit foraminal narrowing is noted at C5-6 and C6-7 on the right. No frank disc extrusion or high-grade stenosis. Upper chest: Visualized lung apical regions are clear. Other: There is a stent in the right carotid artery. IMPRESSION: CT head: Stable atrophy with fairly extensive supratentorial small vessel disease, stable. Prior lacunar infarcts noted in the right basal ganglia and left cerebellum. No acute infarct. No hemorrhage, mass, or extra-axial fluid. Foci of arterial vascular calcification noted. Opacification in multiple ethmoid air cells. CT cervical spine: No fracture or spondylolisthesis. There is multilevel osteoarthritic change. There is a stent in the right carotid artery. Electronically Signed   By: Lowella Grip III M.D.   On: 03/31/2017 14:21    1455:  Pt failed bedside swallow test; formal testing ordered. Unable to perform MRI brain at [facility] due to pacemaker. T/C to [facility]. Lorin Mercy, case discussed, including:  HPI, pertinent PM/SHx, VS/PE, dx testing, ED course and treatment:  Agreeable to come to ED for evaluation for admission.   Final Clinical Impressions(s) / ED Diagnoses   Final diagnoses:  None    New Prescriptions New Prescriptions   No  medications on file      Francine Graven, DO 04/03/17 [id number]

## 2017-03-31 NOTE — ED Triage Notes (Addendum)
Pt brought in by EMS due to weakness since Friday. EMS reports pt fell out of bed on Saturday and since pt has not been able to perform normal task. Generalized weakness , speech is slurred. With slight droop to left corner of mouth. Left side weakness, although drift present in bilateral ext

## 2017-03-31 NOTE — ED Notes (Signed)
Pt to CT

## 2017-03-31 NOTE — H&P (Signed)
History and Physical    Kenneth Vazquez KDX:[id number] DOB: [birth year] DOA: 03/31/2017  PCP: Jodi Marble, MD Consultants:  Manuella Ghazi - neurology; Remer Macho - rheumatology; Terance Hart - urology; cardiology Patient coming from: Home - lives with wife and son; Donald Prose: wife, [phone]  Chief Complaint:  weakness  HPI: Kenneth Vazquez is a 76 y.o. male with medical history significant of CVA x 2 in 2015; seizure d/o on Keppra with last seizure a couple of weeks ago; HTN; pacemaker placement; and dementia presenting with weakness on the right side since he fell on Friday night/Saturday morning.  He sometimes sneaks out of the bed without using his cane/walker.  She heard him fall in the middle of the night and found him on the floor.  She did not notice any injuries but he couldn't bear weight on his leg (unsure which one).  It was hard to get him up and since then he speech wasn't like it was before - "sort of slow with it, dragging".  He seemed to be ok through the weekend but he wasn't talking like she thought he could.  He required help with moving since he can't seem to bear weight on the right side.  He has also been sleeping more.   She called his neurologist this AM (has appt Wednesday) and he told her to bring him here for evaluation.    He has baseline dementia.  He struggles to walk with a walker at baseline.  Normally his speech is more clear.  He has been able to eat and drink and take his medication through the weekend.  Home health comes in twice weekly and he has home PT, but she has not recently checked his BP.   ED Course: Right-sided weakness, right facial droop, failed bedside swallow evaluation, negative CT, unable to perform MRI at [facility] due to pacemaker  Review of Systems:  Unable to perform   Ambulatory Status:  Ambulates with a walker  Past Medical History:  Diagnosis Date  . Arthritis   . Carotid artery disease ([place])   . Dementia   . Hypertension   . Pacemaker   .  Prostate cancer ([place])   . Seizures ([place])    last seizure was a couple of weeks ago  . Stroke [facility]) x2, 2015   residual weakness, wife is unsure which side    Past Surgical History:  Procedure Laterality Date  . CAROTID STENT    . left total knee replacement    . PACEMAKER INSERTION    . PROSTATE CRYOABLATION      Social History   Social History  . Marital status: Married    Spouse name: N/A  . Number of children: N/A  . Years of education: N/A   Occupational History  . Not on file.   Social History Main Topics  . Smoking status: Never Smoker  . Smokeless tobacco: Never Used  . Alcohol use No     Comment: h/o heavy use, no use in recent years  . Drug use: No  . Sexual activity: Not on file   Other Topics Concern  . Not on file   Social History Narrative  . No narrative on file    Allergies  Allergen Reactions  . Sulfa Antibiotics Hives    Family History  Problem Relation Age of Onset  . CVA Mother     Prior to Admission medications   Medication Sig Start Date End Date Taking? Authorizing Provider  aspirin EC  81 MG tablet Take 81 mg by mouth daily.   Yes [provider]  docusate sodium (COLACE) 100 MG capsule Take 100 mg by mouth daily.   Yes [provider]  donepezil (ARICEPT) 10 MG tablet Take 1 tablet (10 mg total) by mouth every morning. 11/30/16  Yes Hosie Poisson, MD  Ferrous Gluconate (IRON 27 PO) Take 1 tablet by mouth daily.   Yes [provider]  furosemide (LASIX) 20 MG tablet Take 20 mg by mouth daily.   Yes [provider]  levETIRAcetam (KEPPRA) 500 MG tablet Take 500 mg by mouth 2 (two) times daily.   Yes [provider]  MYRBETRIQ 25 MG TB24 tablet Take 1 tablet by mouth daily. 11/13/16  Yes [provider]  traZODone (DESYREL) 50 MG tablet Take 50-100 mg by mouth at bedtime.   Yes [provider]  levETIRAcetam (KEPPRA) 750 MG tablet Take 1 tablet (750 mg total) by mouth 2 (two)  times daily. Patient not taking: Reported on 03/31/2017 11/29/16   Hosie Poisson, MD    Physical Exam: Vitals:   03/31/17 1400 03/31/17 1430 03/31/17 1530 03/31/17 1604  BP: (!) 173/88 (!) 155/78 (!) 172/97 (!) 171/94  Pulse: 65 (!) 58 76 93  Resp: 15 14 12 14   Temp:      TempSrc:      SpO2: 99% 98% (!) 77% 100%  Weight:         General: Appears calm and comfortable and is NAD, somnolent Eyes: Right pupil has a defect from prior injury and the eye demonstrates mild exotropia, normal lids, iris ENT:  grossly normal hearing, lips & tongue, mmm Neck:  no LAD, masses or thyromegaly.  No carotid bruit. Cardiovascular:  RRR, no m/r/g. No LE edema.  Respiratory:  CTA bilaterally, no w/r/r. Normal respiratory effort. Abdomen:  soft, ntnd, NABS Skin:  no rash or induration seen on limited exam Musculoskeletal:  Generally weak bilaterally but clearly with right > left upper and lower extremity weakness.  It is hard to assess how much, if any, is due to dementia but he does appear to understand and attempts to follow all directions. Psychiatric: flat mood and affect, speech slowed and slurred, AOx2 Neurologic: right-sided facial droop, sensation intact  Labs on Admission: I have personally reviewed following labs and imaging studies  CBC:  Recent Labs Lab 03/31/17 1242  WBC 4.5  NEUTROABS 3.0  HGB 9.3*  HCT 26.3*  MCV 85.7  PLT 614*   Basic Metabolic Panel:  Recent Labs Lab 03/31/17 1242  NA 136  K 3.8  CL 104  CO2 25  GLUCOSE 100*  BUN 16  CREATININE 0.79  CALCIUM 8.4*   GFR: Estimated Creatinine Clearance: 77.3 mL/min (by C-G formula based on SCr of 0.79 mg/dL). Liver Function Tests:  Recent Labs Lab 03/31/17 1242  AST 20  ALT 12*  ALKPHOS 40  BILITOT 0.7  PROT 6.6  ALBUMIN 2.5*   No results for input(s): LIPASE, AMYLASE in the last 168 hours. No results for input(s): AMMONIA in the last 168 hours. Coagulation Profile:  Recent Labs Lab 03/31/17 1242    INR 1.15   Cardiac Enzymes:  Recent Labs Lab 03/31/17 1242  TROPONINI <0.03   BNP (last 3 results) No results for input(s): PROBNP in the last 8760 hours. HbA1C: No results for input(s): HGBA1C in the last 72 hours. CBG: No results for input(s): GLUCAP in the last 168 hours. Lipid Profile: No results for input(s): CHOL, HDL,  LDLCALC, TRIG, CHOLHDL, LDLDIRECT in the last 72 hours. Thyroid Function Tests: No results for input(s): TSH, T4TOTAL, FREET4, T3FREE, THYROIDAB in the last 72 hours. Anemia Panel: No results for input(s): VITAMINB12, FOLATE, FERRITIN, TIBC, IRON, RETICCTPCT in the last 72 hours. Urine analysis:    Component Value Date/Time   COLORURINE YELLOW 12/06/2016 1130   APPEARANCEUR CLEAR 12/06/2016 1130   APPEARANCEUR Hazy 02/02/2014 1501   LABSPEC 1.025 12/06/2016 1130   LABSPEC 1.023 02/02/2014 1501   PHURINE 5.0 12/06/2016 1130   GLUCOSEU NEGATIVE 12/06/2016 1130   GLUCOSEU Negative 02/02/2014 1501   HGBUR SMALL (A) 12/06/2016 1130   BILIRUBINUR NEGATIVE 12/06/2016 1130   BILIRUBINUR Negative 02/02/2014 1501   KETONESUR NEGATIVE 12/06/2016 1130   PROTEINUR NEGATIVE 12/06/2016 1130   NITRITE NEGATIVE 12/06/2016 1130   LEUKOCYTESUR NEGATIVE 12/06/2016 1130   LEUKOCYTESUR Negative 02/02/2014 1501    Creatinine Clearance: Estimated Creatinine Clearance: 77.3 mL/min (by C-G formula based on SCr of 0.79 mg/dL).  Sepsis Labs: @LABRCNTIP (procalcitonin:4,lacticidven:4) )No results found for this or any previous visit (from the past 240 hour(s)).   Radiological Exams on Admission: Dg Chest 1 View  Result Date: 03/31/2017 CLINICAL DATA:  76 year old male post fall 2 days ago. Right-sided weakness and slurred speech. History stroke. Initial encounter. EXAM: CHEST 1 VIEW COMPARISON:  11/27/2016. FINDINGS: Sequential pacemaker enters from the left with leads unchanged in position appearing to be in the region of the right atrium and right ventricle.  Cardiomegaly. Left base subsegmental atelectasis. No pulmonary edema or pneumothorax. Calcified slightly tortuous aorta. Bilateral acromioclavicular joint degenerative changes. No obvious rib fracture. IMPRESSION: Cardiomegaly with pacemaker in place. Left base subsegmental atelectasis. Electronically Signed   By: Genia Del M.D.   On: 03/31/2017 14:23   Ct Head Wo Contrast  Result Date: 03/31/2017 CLINICAL DATA:  Pain following fall. Slurred speech. Left mouth droop. Left-sided weakness EXAM: CT HEAD WITHOUT CONTRAST CT CERVICAL SPINE WITHOUT CONTRAST TECHNIQUE: Multidetector CT imaging of the head and cervical spine was performed following the standard protocol without intravenous contrast. Multiplanar CT image reconstructions of the cervical spine were also generated. COMPARISON:  Head CT November 27, 2016 FINDINGS: CT HEAD FINDINGS Brain: There is mild diffuse atrophy, stable. There is no intracranial mass, hemorrhage, extra-axial fluid collection, or midline shift. There is patchy small vessel disease in the centra semiovale bilaterally. There is a prior lacunar infarct in the right internal capsule just anterior to the genu. There is evidence of a prior small lacunar infarct in the head of the caudate nucleus on the right. There is evidence of a prior small infarct in the superior left cerebellum. There is no new gray-white compartment lesion. No acute infarct evident. Vascular: There is no appreciable hyperdense vessel. There is calcification in each carotid siphon region. Skull: The bony calvarium appears intact. There is a minimal left superior frontal exostosis, benign and stable. Sinuses/Orbits: There is opacification in multiple ethmoid air cells bilaterally. No intraorbital lesions are identified. Patient appears to have had previous cataract removal on the right, stable. Other: Mastoid air cells are clear. CT CERVICAL SPINE FINDINGS Alignment:  There is no appreciable spondylolisthesis. Skull base  and vertebrae: Skull base and craniocervical junction regions appear normal. No evident fracture. No blastic or lytic bone lesions. Soft tissues and spinal canal: Prevertebral soft tissues and predental space regions are normal. No paraspinous lesion. No cord or canal hematoma. Disc levels: There is moderately severe disc space narrowing at C4-5 and C6-7. There is moderate disc space narrowing  at C3-4, C5-6, and C7-T1. There are anterior osteophytes at all levels except for C2. There is facet hypertrophy at multiple levels. There is exit foraminal narrowing with impression on the exiting nerve root on the left at C3-4, on the left at C4-5, and on the left at C6-7. Milder exit foraminal narrowing is noted at C5-6 and C6-7 on the right. No frank disc extrusion or high-grade stenosis. Upper chest: Visualized lung apical regions are clear. Other: There is a stent in the right carotid artery. IMPRESSION: CT head: Stable atrophy with fairly extensive supratentorial small vessel disease, stable. Prior lacunar infarcts noted in the right basal ganglia and left cerebellum. No acute infarct. No hemorrhage, mass, or extra-axial fluid. Foci of arterial vascular calcification noted. Opacification in multiple ethmoid air cells. CT cervical spine: No fracture or spondylolisthesis. There is multilevel osteoarthritic change. There is a stent in the right carotid artery. Electronically Signed   By: Lowella Grip III M.D.   On: 03/31/2017 14:21   Ct Cervical Spine Wo Contrast  Result Date: 03/31/2017 CLINICAL DATA:  Pain following fall. Slurred speech. Left mouth droop. Left-sided weakness EXAM: CT HEAD WITHOUT CONTRAST CT CERVICAL SPINE WITHOUT CONTRAST TECHNIQUE: Multidetector CT imaging of the head and cervical spine was performed following the standard protocol without intravenous contrast. Multiplanar CT image reconstructions of the cervical spine were also generated. COMPARISON:  Head CT November 27, 2016 FINDINGS: CT HEAD  FINDINGS Brain: There is mild diffuse atrophy, stable. There is no intracranial mass, hemorrhage, extra-axial fluid collection, or midline shift. There is patchy small vessel disease in the centra semiovale bilaterally. There is a prior lacunar infarct in the right internal capsule just anterior to the genu. There is evidence of a prior small lacunar infarct in the head of the caudate nucleus on the right. There is evidence of a prior small infarct in the superior left cerebellum. There is no new gray-white compartment lesion. No acute infarct evident. Vascular: There is no appreciable hyperdense vessel. There is calcification in each carotid siphon region. Skull: The bony calvarium appears intact. There is a minimal left superior frontal exostosis, benign and stable. Sinuses/Orbits: There is opacification in multiple ethmoid air cells bilaterally. No intraorbital lesions are identified. Patient appears to have had previous cataract removal on the right, stable. Other: Mastoid air cells are clear. CT CERVICAL SPINE FINDINGS Alignment:  There is no appreciable spondylolisthesis. Skull base and vertebrae: Skull base and craniocervical junction regions appear normal. No evident fracture. No blastic or lytic bone lesions. Soft tissues and spinal canal: Prevertebral soft tissues and predental space regions are normal. No paraspinous lesion. No cord or canal hematoma. Disc levels: There is moderately severe disc space narrowing at C4-5 and C6-7. There is moderate disc space narrowing at C3-4, C5-6, and C7-T1. There are anterior osteophytes at all levels except for C2. There is facet hypertrophy at multiple levels. There is exit foraminal narrowing with impression on the exiting nerve root on the left at C3-4, on the left at C4-5, and on the left at C6-7. Milder exit foraminal narrowing is noted at C5-6 and C6-7 on the right. No frank disc extrusion or high-grade stenosis. Upper chest: Visualized lung apical regions are  clear. Other: There is a stent in the right carotid artery. IMPRESSION: CT head: Stable atrophy with fairly extensive supratentorial small vessel disease, stable. Prior lacunar infarcts noted in the right basal ganglia and left cerebellum. No acute infarct. No hemorrhage, mass, or extra-axial fluid. Foci of arterial vascular  calcification noted. Opacification in multiple ethmoid air cells. CT cervical spine: No fracture or spondylolisthesis. There is multilevel osteoarthritic change. There is a stent in the right carotid artery. Electronically Signed   By: Lowella Grip III M.D.   On: 03/31/2017 14:21    EKG: Independently reviewed.  Atrial-paced with rate 67; LVH; nonspecific ST changes with no evidence of acute ischemia   Assessment/Plan Principal Problem:   CVA (cerebral vascular accident) ([place]) Active Problems:   Seizures ([place])   Essential hypertension   Dementia   Severe protein-calorie malnutrition (HCC)   Normocytic anemia   Hyperlipidemia   Thrombocytopenia (H[facility])   CVA -Patient with acute on chronic LE weakness but marked weakness of the right arm and leg; also with right facial droop and speech disturbance -Concerning for CVA -UDS negative -ETOH negative -Will admit to [facility] for CVA evaluation -Telemetry monitoring -MRI/MRA -Carotid dopplers -Echo -Risk stratification with FLP, A1c; will also check TSH and UDS -ASA daily -PT/OT/ST/Nutrition Consults -He failed his bedside swallow evaluation and so will need speech therapy evaluation prior to diet -Neurology consult upon arrival at [facility]: Lambert Rhonda W -SW consult for placement  HTN -Allow permissive HTN -Treat BP only if >220/120, and then with goal of 15% reduction -He does not appear to be taking chronic medications for BP control; however, it does appear that he was previously prescribed Lisinopril 2.5 mg daily.  Since his symptoms have been present since early Saturday AM (48 hours), it is likely appropriate to restart this  medication soon.   -He also appears to have taken Hytrin as recently as 4/30.  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 -Baseline is not clear, as the patient is acutely affected and his wife is a poor historian -He is currently oriented to person and place -He is also able to clearly state his code status - "I want to die naturally"  Seizure d/o -Patient has occasional breakthrough seizures -According to his last neurologist note on 4/30, he is supposed to be on Keppra 750 mg BID -His medication list and his last ER visit in June list 500 mg BID -Will continue Keppra at 500 mg BID but change to IV form for now due to concern for swallow dysfunction -Neurology consult upon arrival at [facility], consider increase in dose to 750 mg BID  Malnutrition -Albumin 2.5 -Nutrition consult  Anemia -Hgb 9.3, prior 9.7 on 6/30 -Stable, will follow  Thrombocytopenia -Platelets 136, prior 127 on 6/30 -Present as fall back as at least 2015 -Stable, will follow -Will need to hold Lovenox for platelets <100 -Avoid NSAIDs, if possible     DVT prophylaxis:  Lovenox Code Status: DNR - confirmed with patient/family Family Communication: Wife present throughout evaluation  Disposition Plan: To be determined Consults called: Neurology; PT/OT/ST/Nutrition/SW Admission status: Admit to [facility] - It is my clinical opinion that admission to INPATIENT is reasonable and necessary because this patient will require at least 2 midnights in the hospital to treat this condition based on the medical complexity of the problems presented.  Given the aforementioned information, the predictability of an adverse outcome is felt to be significant.    Karmen Bongo MD [facility]  If 7PM-7AM, please contact night-coverage [email or web address] Password [email or web address]  03/31/2017, 4:24 PM

## 2017-04-01 ENCOUNTER — Inpatient Hospital Stay (HOSPITAL_COMMUNITY): Payer: Medicare Other

## 2017-04-01 ENCOUNTER — Encounter (HOSPITAL_COMMUNITY): Payer: Self-pay | Admitting: Radiology

## 2017-04-01 DIAGNOSIS — I351 Nonrheumatic aortic (valve) insufficiency: Secondary | ICD-10-CM

## 2017-04-01 DIAGNOSIS — I63412 Cerebral infarction due to embolism of left middle cerebral artery: Secondary | ICD-10-CM

## 2017-04-01 LAB — ECHOCARDIOGRAM COMPLETE
AVPHT: 491 ms
Ao-asc: 41 cm
CHL CUP MV DEC (S): 271
E decel time: 271 msec
E/e' ratio: 6.81
FS: 20 % — AB (ref 28–44)
Height: 70 in
IV/PV OW: 0.98
LA ID, A-P, ES: 43 mm
LA diam end sys: 43 mm
LA diam index: 2.26 cm/m2
LA vol index: 33.5 mL/m2
LAVOL: 63.7 mL
LAVOLA4C: 53.4 mL
LDCA: 4.52 cm2
LV TDI E'LATERAL: 11
LV TDI E'MEDIAL: 6.42
LV dias vol index: 66 mL/m2
LV e' LATERAL: 11 cm/s
LV sys vol: 69 mL — AB (ref 21–61)
LVDIAVOL: 125 mL (ref 62–150)
LVEEAVG: 6.81
LVEEMED: 6.81
LVOT VTI: 18.1 cm
LVOT diameter: 24 mm
LVOT peak vel: 93.3 cm/s
LVOTSV: 82 mL
LVSYSVOLIN: 37 mL/m2
Lateral S' vel: 13.1 cm/s
MV pk E vel: 74.9 m/s
MVPG: 2 mmHg
MVPKAVEL: 91.5 m/s
PW: 10.9 mm — AB (ref 0.6–1.1)
Reg peak vel: 231 cm/s
Simpson's disk: 45
Stroke v: 56 ml
TR max vel: 231 cm/s
Weight: 2560 oz

## 2017-04-01 LAB — LIPID PANEL
CHOL/HDL RATIO: 2.9 ratio
CHOLESTEROL: 97 mg/dL (ref 0–200)
HDL: 33 mg/dL — AB (ref >40)
LDL Cholesterol: 57 mg/dL (ref 0–99)
TRIGLYCERIDES: 35 mg/dL (ref <150)
VLDL: 7 mg/dL (ref 0–40)

## 2017-04-01 MED ORDER — ASPIRIN EC 81 MG PO TBEC
81.0000 mg | DELAYED_RELEASE_TABLET | Freq: Every day | ORAL | Status: DC
  Administered 2017-04-02: 81 mg via ORAL
  Filled 2017-04-01 (×2): qty 1

## 2017-04-01 MED ORDER — APIXABAN 5 MG PO TABS
5.0000 mg | ORAL_TABLET | Freq: Two times a day (BID) | ORAL | Status: DC
  Administered 2017-04-01 – 2017-04-02 (×3): 5 mg via ORAL
  Filled 2017-04-01 (×5): qty 1

## 2017-04-01 MED ORDER — IOPAMIDOL (ISOVUE-370) INJECTION 76%
INTRAVENOUS | Status: AC
Start: 2017-04-01 — End: 2017-04-01
  Administered 2017-04-01: 50 mL
  Filled 2017-04-01: qty 50

## 2017-04-01 MED ORDER — ATORVASTATIN CALCIUM 20 MG PO TABS
20.0000 mg | ORAL_TABLET | Freq: Every day | ORAL | Status: DC
  Administered 2017-04-01 – 2017-04-07 (×7): 20 mg via ORAL
  Filled 2017-04-01 (×7): qty 1

## 2017-04-01 NOTE — Progress Notes (Signed)
   04/01/17 1150  Clinical Encounter Type  Visited With Patient and family together  Visit Type Follow-up  Spiritual Encounters  Spiritual Needs Brochure  Stress Factors  Patient Stress Factors Health changes  Family Stress Factors Family relationships  Introduction to Pt and family. Provided and explained AD. When ready explained that chaplain can be paged.

## 2017-04-01 NOTE — Evaluation (Signed)
Speech Language Pathology Evaluation Patient Details Name: Kenneth Vazquez MRN: [id number] DOB: [birth year] Today's Date: 04/01/2017 Time: 4270-6237 SLP Time Calculation (min) (ACUTE ONLY): 20 min  Problem List:  Patient Active Problem List   Diagnosis Date Noted  . CVA (cerebral vascular accident) ([place]) 03/31/2017  . Essential hypertension 03/31/2017  . Dementia 03/31/2017  . Severe protein-calorie malnutrition ([place]) 03/31/2017  . Normocytic anemia 03/31/2017  . Hyperlipidemia 03/31/2017  . Thrombocytopenia ([place]) 03/31/2017  . Seizures ([place]) 11/27/2016   Past Medical History:  Past Medical History:  Diagnosis Date  . Arthritis   . Carotid artery disease ([place])   . Dementia   . Hypertension   . Pacemaker   . Prostate cancer ([place])   . Seizures ([place])    last seizure was a couple of weeks ago  . Stroke [facility]) x2, 2015   residual weakness, wife is unsure which side   Past Surgical History:  Past Surgical History:  Procedure Laterality Date  . CAROTID STENT    . left total knee replacement    . PACEMAKER INSERTION    . PROSTATE CRYOABLATION     HPI:  Pt is a 76 y.o. 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 He sometimes sneaks out of the bed without using his cane/walker. She heard him fall in the middle of the night and found him on the floor. She did not notice any injuries but he couldn't bear weight on his leg (unsure which one). It was hard to get him up and since then he speech wasn't like it was before - "sort of slow with it, dragging". He seemed to be ok through the weekend but he wasn't talking like she thought he could. CXR showed L base subsegmental atelectasis, head CT negative. Pt failed RN swallow screen due to not being able to lick top/ bottom lip. Bedside swallow and  cognitive-linguistic eval ordered.   Assessment / Plan / Recommendation Clinical Impression  Pt currently with moderate cognitive and expressive speech deficits along with mild dysarthria. Suspect that cognitive deficits are pt's baseline; however no family at bedside to confirm. Pt oriented to self and location but disoriented to time and situation; decreased alternating attention impacting task completion, decreased short-term memory/ recall, difficulty following 2-step commands. 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 Pt is aware of speech difficulties. Pt will benefit from continued SLP services in acute care and at next level of care for functional communication and cognitive skills. Will continue to follow.    SLP Assessment  SLP Recommendation/Assessment: Patient needs continued Speech Lanaguage Pathology Services SLP Visit Diagnosis: Cognitive communication deficit (R41.841)    Follow Up Recommendations   (TBD)    Frequency and Duration min 2x/week  1 week      SLP Evaluation Cognition  Overall Cognitive Status: No family/caregiver present to determine baseline cognitive functioning Arousal/Alertness: Awake/alert Orientation Level: Oriented to person;Disoriented to time;Oriented to place;Disoriented to situation Attention: Selective;Alternating Selective Attention: Appears intact Alternating Attention: Impaired Alternating Attention Impairment: Verbal basic;Functional basic Memory: Impaired Memory Impairment: Decreased recall of new information Awareness:  (some awareness of speech difficulty)       Comprehension  Auditory Comprehension Overall Auditory Comprehension: Impaired Yes/No Questions: Within Functional Limits Commands: Impaired One Step Basic Commands: 75-100% accurate  Two Step Basic Commands: 50-74% accurate Conversation: Simple    Expression Expression Primary Mode of  Expression: Verbal Verbal Expression Overall Verbal Expression: Impaired Level of Generative/Spontaneous Verbalization: Sentence Naming: Impairment Confrontation: Impaired Verbal Errors: Other (comment) (pauses, part/ whole word repetitions) Pragmatics: No impairment Non-Verbal Means of Communication: Not applicable   Oral / Motor  Oral Motor/Sensory Function Overall Oral Motor/Sensory Function: Mild impairment Facial ROM: Reduced right Facial Symmetry: Abnormal symmetry right Motor Speech Overall Motor Speech: Impaired Respiration: Within functional limits Phonation: Normal Resonance: Within functional limits Articulation: Impaired Level of Impairment: Sentence Intelligibility: Intelligible Motor Speech Errors: Aware   GO                    Kern Reap, [place], CCC-SLP 04/01/2017, 9:14 AM [id number]

## 2017-04-01 NOTE — Care Management Note (Signed)
Case Management Note  Patient Details  Name: Kenneth Vazquez MRN: [id number] Date of Birth: [birth year]  Subjective/Objective:       Presented following a fall,history of seizure disorder, stroke with residual right-sided weakness. Resides with wife, Eliseo Squires. States PTA independent with ADL's and used a walker with ambulation. Active with [facility] for home health services (RN, PT) PTA.   MAYLON SAILORS (Spouse) Jeneen Rinks (Daughter)    [phone]     PCP: S. Ahmed Tejan-Sie  Action/Plan: Plan is to  Return to home when medically stable. CM to f/u with disposition needs.  Expected Discharge Date:                  Expected Discharge Plan:  [place]  In-House Referral:     Discharge planning Services  CM Consult  Post Acute Care Choice:  Resumption of Svcs/PTA Provider, Home Health Choice offered to:  Patient  DME Arranged:    DME Agency:     HH Arranged:  PT, RN HH Agency:  [facility]  Status of Service:  In process, will continue to follow  If discussed at Long Length of Stay Meetings, dates discussed:    Additional Comments:  Sharin Mons, RN 04/01/2017, 1:19 PM

## 2017-04-01 NOTE — Progress Notes (Addendum)
Initial Nutrition Assessment  DOCUMENTATION CODES:   Not applicable  INTERVENTION:    Ensure Enlive po BID, each supplement provides 350 kcal and 20 grams of protein  NUTRITION DIAGNOSIS:   Predicted suboptimal nutrient intake related to  (dementia, weakness) as evidenced by  (chart review, clinical swallow evaluation)  GOAL:   Patient will meet greater than or equal to 90% of their needs  MONITOR:   PO intake, Supplement acceptance, Labs, Weight trends, I & O's  REASON FOR ASSESSMENT:   Consult, Malnutrition Screening Tool  (CVA)  ASSESSMENT:   76 y.o. Male with medical history significant of CVA x 2 in 2015; seizure d/o on Keppra with last seizure a couple of weeks ago; HTN; pacemaker placement; and dementia presenting with weakness on the right side since he fell on Friday night/Saturday morning.   Pt not in room upon visit. Chart reviewed. Per Malnutrition Screening Tool Report, pt eating poorly because of a decreased appetite. Also with recent weight loss without trying. Per readings below, pt with 6% loss x 1 month.  S/p bedside swallow evaluation this AM. SLP rec Regular, thin liquids.  Likely will need assistance with feeding and set-up for meals. Labs and medications reviewed. CBG 100.  Unable to complete Nutrition-Focused physical exam at this time.   Diet Order:  Diet Heart Room service appropriate? Yes; Fluid consistency: Thin  Skin:  Reviewed, no issues  Last BM:  PTA  Height:   Ht Readings from Last 1 Encounters:  04/01/17 5\' 10"  (1.778 m)   Weight:   Wt Readings from Last 1 Encounters:  04/01/17 160 lb (72.6 kg)   Wt Readings from Last 10 Encounters:  04/01/17 160 lb (72.6 kg)  03/01/17 170 lb (77.1 kg)  11/27/16 180 lb (81.6 kg)  02/07/15 188 lb (85.3 kg)   Ideal Body Weight:  75.4 kg  BMI:  Body mass index is 22.96 kg/m.  Estimated Nutritional Needs:   Kcal:  1700-1900  Protein:  80-90 gm  Fluid:  1.7-1.9 L  EDUCATION  NEEDS:   No education needs identified at this time  Arthur Holms, RD, LDN Pager #: [phone] After-Hours Pager #: [phone]

## 2017-04-01 NOTE — Evaluation (Signed)
Clinical/Bedside Swallow Evaluation Patient Details  Name: Kenneth Vazquez MRN: [id number] Date of Birth: Oct 08, 1940  Today's Date: 04/01/2017 Time: SLP Start Time (ACUTE ONLY): 0932 SLP Stop Time (ACUTE ONLY): 0855 SLP Time Calculation (min) (ACUTE ONLY): 20 min  Past Medical History:  Past Medical History:  Diagnosis Date  . Arthritis   . Carotid artery disease ([place])   . Dementia   . Hypertension   . Pacemaker   . Prostate cancer ([place])   . Seizures ([place])    last seizure was a couple of weeks ago  . Stroke [facility]) x2, 2015   residual weakness, wife is unsure which side   Past Surgical History:  Past Surgical History:  Procedure Laterality Date  . CAROTID STENT    . left total knee replacement    . PACEMAKER INSERTION    . PROSTATE CRYOABLATION     HPI:  Kenneth Vazquez is a 76 y.o. 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 He sometimes sneaks out of the bed without using his cane/walker. She heard him fall in the middle of the night and found him on the floor. She did not notice any injuries but he couldn't bear weight on his leg (unsure which one). It was hard to get him up and since then he speech wasn't like it was before - "sort of slow with it, dragging". He seemed to be ok through the weekend but he wasn't talking like she thought he could. CXR showed L base subsegmental atelectasis, head CT negative. Kenneth Vazquez failed RN swallow screen due to not being able to lick top/ bottom lip. Bedside swallow and cognitive-linguistic eval ordered.   Assessment / Plan / Recommendation Clinical Impression  Kenneth Vazquez showed no overt s/s of aspiration during this evaluation, no changes in vocal quality, no difficulty taking consecutive sips by straw. Kenneth Vazquez may need some assistance with feeding and set-up for meals. Cognitive status puts  Kenneth Vazquez at an increased risk of aspiration. Recommend initiating regular diet/ thin liquids, meds whole with liquid, intermittent supervision to cue small bites/ sips, provide assistance as needed during meals. Will sign off for swallow orders but will continue to follow for cognitive-linguistic needs. SLP Visit Diagnosis: Dysphagia, unspecified (R13.10)    Aspiration Risk  Mild aspiration risk    Diet Recommendation Regular;Thin liquid   Liquid Administration via: Cup;Straw Medication Administration: Whole meds with liquid Supervision: Patient able to self feed;Intermittent supervision to cue for compensatory strategies Compensations: Slow rate;Small sips/bites Postural Changes: Seated upright at 90 degrees    Other  Recommendations Oral Care Recommendations: Oral care BID   Follow up Recommendations 24 hour supervision/assistance      Frequency and Duration            Prognosis        Swallow Study   General HPI: Kenneth Vazquez is a 76 y.o. 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 He sometimes sneaks out of the bed without using his cane/walker. She heard him fall in the middle of the night and found him on the floor. She did not notice any injuries but he couldn't bear weight on his leg (unsure  which one). It was hard to get him up and since then he speech wasn't like it was before - "sort of slow with it, dragging". He seemed to be ok through the weekend but he wasn't talking like she thought he could. CXR showed L base subsegmental atelectasis, head CT negative. Kenneth Vazquez failed RN swallow screen due to not being able to lick top/ bottom lip. Bedside swallow and cognitive-linguistic eval ordered. Type of Study: Bedside Swallow Evaluation Previous Swallow Assessment: none in chart Diet Prior to this Study:  NPO Temperature Spikes Noted: No Respiratory Status: Room air History of Recent Intubation: No Behavior/Cognition: Alert;Cooperative Oral Cavity Assessment: Dry Oral Cavity - Dentition: Adequate natural dentition Vision: Functional for self-feeding Self-Feeding Abilities: Able to feed self Patient Positioning: Upright in bed Baseline Vocal Quality: Normal Volitional Cough: Strong    Oral/Motor/Sensory Function Overall Oral Motor/Sensory Function: Mild impairment Facial ROM: Reduced right Facial Symmetry: Abnormal symmetry right   Ice Chips Ice chips: Not tested   Thin Liquid Thin Liquid: Within functional limits Presentation: Cup;Straw    Nectar Thick Nectar Thick Liquid: Not tested   Honey Thick Honey Thick Liquid: Not tested   Puree Puree: Within functional limits   Solid   GO   Solid: Within functional limits        Kern Reap, [place], CCC-SLP 04/01/2017,9:03 AM [phone]

## 2017-04-01 NOTE — Progress Notes (Signed)
PT Cancellation Note  Patient Details Name: Kenneth Vazquez MRN: [id number] DOB: 1941-05-09   Cancelled Treatment:    Reason Eval/Treat Not Completed: Patient not medically ready.  Patient on bedrest per orders.  **MD:  Please write activity orders when appropriate for patient.  PT will initiate evaluation at that time.  Thank you.   Despina Pole 04/01/2017, 3:06 PM Carita Pian. Sanjuana Kava, [place] Pager [phone]

## 2017-04-01 NOTE — Progress Notes (Signed)
STROKE TEAM PROGRESS NOTE   HISTORY OF PRESENT ILLNESS (per record) Kenneth Vazquez is a 76 y.o. male with a history of seizure disorder, stroke with residual right-sided weakness, s/p R CEA, who presented after a fall with slurred speech.  He was in his normal state of health until 03/31/2017 when his right side became weaker and he fell.  His wife called his neurologist who told her to take him to the emergency department. In the ER and [facility] there is concern for stroke and he was transferred to [facility].  Unfortunately, history is limited by lack of family and poor historian of this patient. He apparently has a history of dementia and he sometimes "sneaks out of the bed" without using his walker. She heard him fall in the middle the night on Friday night, and he has slurred speech since then. She states that he has had persistent deficit since that time.  Though unclear exactly what his baseline is, at least in March when Dr. Merlene Laughter saw him at [facility] he had relatively symmetric strength and a relatively symmetric face.  Discussed interrogation of pacemaker on 04/01/2017 with [employer] tech.  Confirmed 22 episodes of atrial flutter/atrial tachycardia.  Starting anticoagulation.  LKW: Friday prior to bed  Patient was not administered IV t-PA secondary to arriving outside of the treatment window. He was admitted to General Neurology for further evaluation and treatment.   SUBJECTIVE (INTERVAL HISTORY) No family is at the bedside.  Pt lying in bed, disorientated, paucity of speech. Still has right sided mild hemiparesis. Pacer interrogation showed afib, burden about 1%.    OBJECTIVE Temp:  [97.6 F (36.4 C)-98.7 F (37.1 C)] 97.8 F (36.6 C) (07/30 2205) Pulse Rate:  [52-93] 52 (07/31 0600) Cardiac Rhythm: Atrial paced (07/31 0052) Resp:  [12-18] 18 (07/30 2205) BP: (129-192)/(78-98) 184/87 (07/31 0600) SpO2:  [77 %-100 %] 95 % (07/30 2206) Weight:  [72.6 kg (160 lb)] 72.6 kg (160  lb) (07/30 1156)  CBC:  Recent Labs Lab 03/31/17 1242  WBC 4.5  NEUTROABS 3.0  HGB 9.3*  HCT 26.3*  MCV 85.7  PLT 136*    Basic Metabolic Panel:  Recent Labs Lab 03/31/17 1242  NA 136  K 3.8  CL 104  CO2 25  GLUCOSE 100*  BUN 16  CREATININE 0.79  CALCIUM 8.4*    Lipid Panel:    Component Value Date/Time   CHOL 97 04/01/2017 0411   CHOL 104 02/10/2014 0128   TRIG 35 04/01/2017 0411   TRIG 50 02/10/2014 0128   HDL 33 (L) 04/01/2017 0411   HDL 35 (L) 02/10/2014 0128   CHOLHDL 2.9 04/01/2017 0411   VLDL 7 04/01/2017 0411   VLDL 10 02/10/2014 0128   LDLCALC 57 04/01/2017 0411   LDLCALC 59 02/10/2014 0128   HgbA1c: No results found for: HGBA1C Urine Drug Screen:    Component Value Date/Time   LABOPIA NONE DETECTED 03/31/2017 1229   COCAINSCRNUR NONE DETECTED 03/31/2017 1229   LABBENZ NONE DETECTED 03/31/2017 1229   AMPHETMU NONE DETECTED 03/31/2017 1229   THCU NONE DETECTED 03/31/2017 1229   LABBARB NONE DETECTED 03/31/2017 1229    Alcohol Level     Component Value Date/Time   ETH <5 03/31/2017 1242    IMAGING I have personally reviewed the radiological images below and agree with the radiology interpretations.  Ct Angio Head W Or Wo Contrast Ct Angio Neck W Or Wo Contrast 04/01/2017 IMPRESSION: Previous of right carotid stent. Minimal  diameter in the proximal ICA region within the stent is 2.5 mm, consistent with a 50% stenosis. Wide patency beyond that. Mild atherosclerotic change at the left carotid bifurcation but no stenosis or significant irregularity. Tortuous internal carotid arteries bilaterally. Aortic atherosclerosis. Atherosclerotic disease in both carotid siphon regions without stenosis greater than 30% on either side. 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 Diminutive vertebral arteries and small proximal basilar artery. Distal basilar supply drives primarily from the left  trigeminal artery. No evidence of intracranial branch vessel occlusion.  Dg Chest 1 View 03/31/2017 IMPRESSION: Cardiomegaly with pacemaker in place. Left base subsegmental atelectasis.  Ct Head Wo Contrast Ct Cervical Spine Wo Contrast 03/31/2017 IMPRESSION: CT head: Stable atrophy with fairly extensive supratentorial small vessel disease, stable. Prior lacunar infarcts noted in the right basal ganglia and left cerebellum. No acute infarct. No hemorrhage, mass, or extra-axial fluid. Foci of arterial vascular calcification noted. Opacification in multiple ethmoid air cells. CT cervical spine: No fracture or spondylolisthesis. There is multilevel osteoarthritic change. There is a stent in the right carotid artery.  2D Echo 04/01/2017 - Left ventricle: The cavity size was normal. Wall thickness was   normal. Systolic function was mildly reduced. The estimated   ejection fraction was in the range of 45% to 50%. Mild diffuse   hypokinesis with no identifiable regional variations. Doppler   parameters are consistent with abnormal left ventricular   relaxation (grade 1 diastolic dysfunction). - Aortic valve: There was mild to moderate regurgitation directed   eccentrically in the LVOT and towards the mitral anterior   leaflet. - Mitral valve: There was mild regurgitation. Diastolic   regurgitation was present. - Left atrium: The atrium was mildly dilated. - Line: A was visualized in the superior vena cava, with its tip in   the right atrium. No abnormal features noted.   PHYSICAL EXAM  Temp:  [98.4 F (36.9 C)] 98.4 F (36.9 C) (07/31 2001) Pulse Rate:  [52-91] 64 (07/31 2001) Resp:  [16-18] 18 (07/31 2001) BP: (151-187)/(69-88) 161/80 (07/31 2001) SpO2:  [90 %-100 %] 100 % (07/31 2001) Weight:  [160 lb (72.6 kg)] 160 lb (72.6 kg) (07/31 1030)  General - Well nourished, well developed, in no apparent distress.  Ophthalmologic - Fundi not visualized due to  noncooperation.  Cardiovascular - Regular rate and rhythm.  Neuro - awake, alert, eyes open, following limited simple commands. Paucity of speech, moderate dysarthria. Not orientated to place, age, time. PERRL, eyes attending to both sides, inconsistent for visual threat testing but grossly visual field intact. Right nasolabial fold flattening, tongue midline. Right UE and LE 4/5 proximal, 5-/5 distal. LUE and LLE 5/5. Sensation, coordination not cooperative and gait not tested.   ASSESSMENT/PLAN Mr. JERIMAH WITUCKI is a 76 y.o. male with history ofseizure disorder, stroke with residual right-sided weakness who was in his normal state of health until last Friday when his right side became weaker and fell on 03/31/2017.  He did not receive IV t-PA due to arriving outside of the treatment window.  Left brain stroke likely due to newly recognized atrial fibrillation without anticoagulation.  Resultant  Right mild hemiparesis  CT head: No acute stroke  MRI head: unable to obtain due to incompatible pacemaker  CTA head/neck: R carotid stent with 50% stenosis within the stent.    2D Echo  EF 45-50%, declined from 10/2016 EF 55-60%  Pacer interrogation showed afib burden 1%  LDL 59  HgbA1c pending  SCDs for VTE prophylaxis  Diet NPO time specified  aspirin 81 mg daily prior to admission, now on Eliquis (apixaban) daily and ASA 81mg . Continue eliquis and ASA 81mg  on discharge for stroke and PVD prevention.   Patient counseled to be compliant with his antithrombotic medications  Ongoing aggressive stroke risk factor management  Therapy recommendations:  pending  Disposition:  pending  Atrial fibrillation  Newly recognized, 22 episodes of Atrial flutter/atrial tachycardia on 04/01/2017 pacemaker interrogation  Pharmacy consult for Eliquis   Recommend cardiology consult for newly recognized afib  Cardiomyopathy   EF 45-50% this admission  11/28/16 TTE showed EF  55-60%  Recommend cardiology consult for CHF and cardiomyopathy  S/p right ICA stent  01/2014 stent placement  CTA neck this time showed 50% in-stent stenosis  Continue ASA 81  Hx of stroke  12/2013 left cerebellar  01/2014 right frontal infarct - ASA changed to plavix  Follows with Dr. Manuella Ghazi at [facility]  Seizure   EEG 08/2014 bi-frontal spike  EEG 06/2015 temporal discharge  05/2015 seizure episode  10/2016 seizure episode  On keppra 750mg  bid - continue  Follows with Dr. Manuella Ghazi at [place] [facility]  Hypertension  Stable  Permissive hypertension (OK if < 220/120) but gradually normalize in 5-7 days  Long-term BP goal normotensive  Hyperlipidemia  Home meds: none  LDL 59, goal < 70  Add atorvastatin 20mg  PO daily  Continue statin at discharge  Other Stroke Risk Factors  Advanced age  Family hx stroke (mother)  Other Active Problems  History of prostate cancer  Dementia   Pacer in place  Hospital day # 1  Neurology will sign off. Please call with questions. Pt will follow up with Dr. [facility] [place] in about 6 weeks. Thanks for the consult.  Rosalin Hawking, MD PhD Stroke Neurology 04/01/2017 11:00 PM    To contact Stroke Continuity provider, please refer to [email or web address] After hours, contact General Neurology

## 2017-04-01 NOTE — Progress Notes (Signed)
B/p elevated. Per note by MD Lorin Mercy, b/p will not be treated unless greater than 220/120.

## 2017-04-01 NOTE — Progress Notes (Signed)
ANTICOAGULATION CONSULT NOTE  Pharmacy Consult for apixaban Indication: atrial fibrillation   Assessment: 89 yom admitted for stroke evaluation. Pharmacy consulted to dose apixaban for afib. Not on anticoagulation PTA. Hg 9.3, plt 136. SCr wnl. No bleed documented. CT head negative for bleed.  Currently on Lovenox prophylaxis - ordered, not yet given.  Goal of Therapy:  Stroke prevention Monitor platelets by anticoagulation protocol: Yes   Plan:  D/c Lovenox Apixaban 5mg  PO BID Monitor CBC, s/sx bleeding   Elicia Lamp, PharmD, BCPS Clinical Pharmacist 04/01/2017 3:55 PM

## 2017-04-01 NOTE — Evaluation (Signed)
OT Cancellation Note  Patient Details Name: Kenneth Vazquez MRN: [id number] DOB: October 12, 1940   Cancelled Treatment:    Reason Eval/Treat Not Completed: Other (comment); Pt currently with active bed rest orders. Will follow up as Pt is medically ready and as schedule permits.   Lou Cal, OT Pager [phone] 04/01/2017   Raymondo Band 04/01/2017, 9:49 AM

## 2017-04-01 NOTE — Progress Notes (Signed)
  Echocardiogram 2D Echocardiogram has been performed.  Kenneth Vazquez 04/01/2017, 5:15 PM

## 2017-04-01 NOTE — Progress Notes (Signed)
   04/01/17 1110  Clinical Encounter Type  Visited With Patient not available  Visit Type Other (Comment) ([place] consult)  Spiritual Encounters  Spiritual Needs Emotional  Stress Factors  Patient Stress Factors Not reviewed  Pt unavailable. Check back later.

## 2017-04-01 NOTE — Progress Notes (Signed)
PROGRESS NOTE    Kenneth Vazquez  ZTI:[id number] DOB: October 30, 1940 DOA: 03/31/2017 PCP: Jodi Marble, MD    Brief Narrative: Kenneth Vazquez is a 76 y.o. male with a history of seizure disorder, stroke with residual right-sided weakness who was in his normal state of health until last Friday when his right side became weaker, and had a fall prior to admission. He was admitted for evaluation of stroke.   Assessment & Plan:   Principal Problem:   CVA (cerebral vascular accident) ([place]) Active Problems:   Seizures ([place])   Essential hypertension   Dementia   Severe protein-calorie malnutrition ([place])   Normocytic anemia   Hyperlipidemia   Thrombocytopenia ([place])  Right sided weakness :  Admitted for evaluation of Stroke.  Initial CT head without contrast does not show any acute stroke.  Ct head and neck shows  Previous of right carotid stent. Minimal diameter in the proximal ICA region within the stent is 2.5 mm, consistent with a 50% stenosis.  MRI and MRA couldn't be done due to PPM.  LDL is 57, HDL is 33.  hgba1c is pending.  Echocardiogram pending.  Neurology consulted and recommendations given.  Therapy evals are pending. On aspirin daily.    Seizures:  None since admission.  Resume keppra.    Normocytic anemia and mild thrombocytopenia:  Baseline hemoglobin between 11 to 10.  Monitor.    Dementia; Resume aricept.      DVT prophylaxis: (Lovenox) Code Status: DNR) Family Communication: none at bedside.  Disposition Plan: pending further evaluation.   Consultants:   Neurology.    Procedures: CT angio of the head and neck.    Antimicrobials: none.    Subjective: No new complaints.   Objective: Vitals:   04/01/17 0043 04/01/17 0200 04/01/17 0400 04/01/17 0600  BP: (!) 187/88 (!) 184/85 (!) 159/88 (!) 184/87  Pulse: (!) 56 (!) 59 (!) 59 (!) 52  Resp:      Temp:      TempSrc:      SpO2:      Weight:       No intake or output data in the  24 hours ending 04/01/17 0848 Filed Weights   03/31/17 1156  Weight: 72.6 kg (160 lb)    Examination:  General exam: Appears calm and comfortable  Respiratory system: Clear to auscultation. Respiratory effort normal. Cardiovascular system: S1 & S2 heard, RRR. No JVD, murmurs, rubs, gallops or clicks. No pedal edema. Gastrointestinal system: Abdomen is nondistended, soft and nontender. No organomegaly or masses felt. Normal bowel sounds heard. Central nervous system: Alert and oriented. Extremities: Symmetric 5 x 5 power. Skin: No rashes, lesions or ulcers Psychiatry: l. Mood & affect appropriate.     Data Reviewed: I have personally reviewed following labs and imaging studies  CBC:  Recent Labs Lab 03/31/17 1242  WBC 4.5  NEUTROABS 3.0  HGB 9.3*  HCT 26.3*  MCV 85.7  PLT 825*   Basic Metabolic Panel:  Recent Labs Lab 03/31/17 1242  NA 136  K 3.8  CL 104  CO2 25  GLUCOSE 100*  BUN 16  CREATININE 0.79  CALCIUM 8.4*   GFR: Estimated Creatinine Clearance: 77.3 mL/min (by C-G formula based on SCr of 0.79 mg/dL). Liver Function Tests:  Recent Labs Lab 03/31/17 1242  AST 20  ALT 12*  ALKPHOS 40  BILITOT 0.7  PROT 6.6  ALBUMIN 2.5*   No results for input(s): LIPASE, AMYLASE in the last 168 hours. No results  for input(s): AMMONIA in the last 168 hours. Coagulation Profile:  Recent Labs Lab 03/31/17 1242  INR 1.15   Cardiac Enzymes:  Recent Labs Lab 03/31/17 1242  TROPONINI <0.03   BNP (last 3 results) No results for input(s): PROBNP in the last 8760 hours. HbA1C: No results for input(s): HGBA1C in the last 72 hours. CBG: No results for input(s): GLUCAP in the last 168 hours. Lipid Profile:  Recent Labs  04/01/17 0411  CHOL 97  HDL 33*  LDLCALC 57  TRIG 35  CHOLHDL 2.9   Thyroid Function Tests: No results for input(s): TSH, T4TOTAL, FREET4, T3FREE, THYROIDAB in the last 72 hours. Anemia Panel: No results for input(s): VITAMINB12,  FOLATE, FERRITIN, TIBC, IRON, RETICCTPCT in the last 72 hours. Sepsis Labs: No results for input(s): PROCALCITON, LATICACIDVEN in the last 168 hours.  No results found for this or any previous visit (from the past 240 hour(s)).       Radiology Studies: Dg Chest 1 View  Result Date: 03/31/2017 CLINICAL DATA:  76 year old male post fall 2 days ago. Right-sided weakness and slurred speech. History stroke. Initial encounter. EXAM: CHEST 1 VIEW COMPARISON:  11/27/2016. FINDINGS: Sequential pacemaker enters from the left with leads unchanged in position appearing to be in the region of the right atrium and right ventricle. Cardiomegaly. Left base subsegmental atelectasis. No pulmonary edema or pneumothorax. Calcified slightly tortuous aorta. Bilateral acromioclavicular joint degenerative changes. No obvious rib fracture. IMPRESSION: Cardiomegaly with pacemaker in place. Left base subsegmental atelectasis. Electronically Signed   By: Genia Del M.D.   On: 03/31/2017 14:23   Ct Head Wo Contrast  Result Date: 03/31/2017 CLINICAL DATA:  Pain following fall. Slurred speech. Left mouth droop. Left-sided weakness EXAM: CT HEAD WITHOUT CONTRAST CT CERVICAL SPINE WITHOUT CONTRAST TECHNIQUE: Multidetector CT imaging of the head and cervical spine was performed following the standard protocol without intravenous contrast. Multiplanar CT image reconstructions of the cervical spine were also generated. COMPARISON:  Head CT November 27, 2016 FINDINGS: CT HEAD FINDINGS Brain: There is mild diffuse atrophy, stable. There is no intracranial mass, hemorrhage, extra-axial fluid collection, or midline shift. There is patchy small vessel disease in the centra semiovale bilaterally. There is a prior lacunar infarct in the right internal capsule just anterior to the genu. There is evidence of a prior small lacunar infarct in the head of the caudate nucleus on the right. There is evidence of a prior small infarct in the superior  left cerebellum. There is no new gray-white compartment lesion. No acute infarct evident. Vascular: There is no appreciable hyperdense vessel. There is calcification in each carotid siphon region. Skull: The bony calvarium appears intact. There is a minimal left superior frontal exostosis, benign and stable. Sinuses/Orbits: There is opacification in multiple ethmoid air cells bilaterally. No intraorbital lesions are identified. Patient appears to have had previous cataract removal on the right, stable. Other: Mastoid air cells are clear. CT CERVICAL SPINE FINDINGS Alignment:  There is no appreciable spondylolisthesis. Skull base and vertebrae: Skull base and craniocervical junction regions appear normal. No evident fracture. No blastic or lytic bone lesions. Soft tissues and spinal canal: Prevertebral soft tissues and predental space regions are normal. No paraspinous lesion. No cord or canal hematoma. Disc levels: There is moderately severe disc space narrowing at C4-5 and C6-7. There is moderate disc space narrowing at C3-4, C5-6, and C7-T1. There are anterior osteophytes at all levels except for C2. There is facet hypertrophy at multiple levels. There is exit foraminal  narrowing with impression on the exiting nerve root on the left at C3-4, on the left at C4-5, and on the left at C6-7. Milder exit foraminal narrowing is noted at C5-6 and C6-7 on the right. No frank disc extrusion or high-grade stenosis. Upper chest: Visualized lung apical regions are clear. Other: There is a stent in the right carotid artery. IMPRESSION: CT head: Stable atrophy with fairly extensive supratentorial small vessel disease, stable. Prior lacunar infarcts noted in the right basal ganglia and left cerebellum. No acute infarct. No hemorrhage, mass, or extra-axial fluid. Foci of arterial vascular calcification noted. Opacification in multiple ethmoid air cells. CT cervical spine: No fracture or spondylolisthesis. There is multilevel  osteoarthritic change. There is a stent in the right carotid artery. Electronically Signed   By: Lowella Grip III M.D.   On: 03/31/2017 14:21   Ct Cervical Spine Wo Contrast  Result Date: 03/31/2017 CLINICAL DATA:  Pain following fall. Slurred speech. Left mouth droop. Left-sided weakness EXAM: CT HEAD WITHOUT CONTRAST CT CERVICAL SPINE WITHOUT CONTRAST TECHNIQUE: Multidetector CT imaging of the head and cervical spine was performed following the standard protocol without intravenous contrast. Multiplanar CT image reconstructions of the cervical spine were also generated. COMPARISON:  Head CT November 27, 2016 FINDINGS: CT HEAD FINDINGS Brain: There is mild diffuse atrophy, stable. There is no intracranial mass, hemorrhage, extra-axial fluid collection, or midline shift. There is patchy small vessel disease in the centra semiovale bilaterally. There is a prior lacunar infarct in the right internal capsule just anterior to the genu. There is evidence of a prior small lacunar infarct in the head of the caudate nucleus on the right. There is evidence of a prior small infarct in the superior left cerebellum. There is no new gray-white compartment lesion. No acute infarct evident. Vascular: There is no appreciable hyperdense vessel. There is calcification in each carotid siphon region. Skull: The bony calvarium appears intact. There is a minimal left superior frontal exostosis, benign and stable. Sinuses/Orbits: There is opacification in multiple ethmoid air cells bilaterally. No intraorbital lesions are identified. Patient appears to have had previous cataract removal on the right, stable. Other: Mastoid air cells are clear. CT CERVICAL SPINE FINDINGS Alignment:  There is no appreciable spondylolisthesis. Skull base and vertebrae: Skull base and craniocervical junction regions appear normal. No evident fracture. No blastic or lytic bone lesions. Soft tissues and spinal canal: Prevertebral soft tissues and  predental space regions are normal. No paraspinous lesion. No cord or canal hematoma. Disc levels: There is moderately severe disc space narrowing at C4-5 and C6-7. There is moderate disc space narrowing at C3-4, C5-6, and C7-T1. There are anterior osteophytes at all levels except for C2. There is facet hypertrophy at multiple levels. There is exit foraminal narrowing with impression on the exiting nerve root on the left at C3-4, on the left at C4-5, and on the left at C6-7. Milder exit foraminal narrowing is noted at C5-6 and C6-7 on the right. No frank disc extrusion or high-grade stenosis. Upper chest: Visualized lung apical regions are clear. Other: There is a stent in the right carotid artery. IMPRESSION: CT head: Stable atrophy with fairly extensive supratentorial small vessel disease, stable. Prior lacunar infarcts noted in the right basal ganglia and left cerebellum. No acute infarct. No hemorrhage, mass, or extra-axial fluid. Foci of arterial vascular calcification noted. Opacification in multiple ethmoid air cells. CT cervical spine: No fracture or spondylolisthesis. There is multilevel osteoarthritic change. There is a stent in the  right carotid artery. Electronically Signed   By: Lowella Grip III M.D.   On: 03/31/2017 14:21        Scheduled Meds: . aspirin  300 mg Rectal Daily   Or  . aspirin  325 mg Oral Daily  . donepezil  10 mg Oral QHS  . enoxaparin (LOVENOX) injection  40 mg Subcutaneous Q24H   Continuous Infusions: . sodium chloride 50 mL/hr at 04/01/17 0036  . levETIRAcetam       LOS: 1 day    Time spent: 72 min    Michelena Culmer, MD [facility] Pager [phone] If 7PM-7AM, please contact night-coverage [email or web address] Password [facility] 04/01/2017, 8:48 AM

## 2017-04-02 DIAGNOSIS — E440 Moderate protein-calorie malnutrition: Secondary | ICD-10-CM

## 2017-04-02 LAB — BASIC METABOLIC PANEL
ANION GAP: 6 (ref 5–15)
BUN: 11 mg/dL (ref 6–20)
CALCIUM: 7.7 mg/dL — AB (ref 8.9–10.3)
CHLORIDE: 108 mmol/L (ref 101–111)
CO2: 24 mmol/L (ref 22–32)
Creatinine, Ser: 0.84 mg/dL (ref 0.61–1.24)
GFR calc non Af Amer: >60 mL/min (ref >60)
GLUCOSE: 124 mg/dL — AB (ref 65–99)
POTASSIUM: 3.6 mmol/L (ref 3.5–5.1)
Sodium: 138 mmol/L (ref 135–145)

## 2017-04-02 LAB — CBC
HEMATOCRIT: 26.2 % — AB (ref 39.0–52.0)
HEMOGLOBIN: 9.1 g/dL — AB (ref 13.0–17.0)
MCH: 29.2 pg (ref 26.0–34.0)
MCHC: 34.7 g/dL (ref 30.0–36.0)
MCV: 84 fL (ref 78.0–100.0)
Platelets: 122 10*3/uL — ABNORMAL LOW (ref 150–400)
RBC: 3.12 MIL/uL — ABNORMAL LOW (ref 4.22–5.81)
RDW: 14.7 % (ref 11.5–15.5)
WBC: 4.6 10*3/uL (ref 4.0–10.5)

## 2017-04-02 LAB — HEMOGLOBIN A1C
HEMOGLOBIN A1C: 5 % (ref 4.8–5.6)
MEAN PLASMA GLUCOSE: 97 mg/dL

## 2017-04-02 LAB — URINE CULTURE: Culture: <10000 — AB

## 2017-04-02 MED ORDER — ENSURE ENLIVE PO LIQD
237.0000 mL | Freq: Two times a day (BID) | ORAL | Status: DC
  Administered 2017-04-02 – 2017-04-07 (×10): 237 mL via ORAL

## 2017-04-02 NOTE — Progress Notes (Signed)
   04/02/17 1010  Clinical Encounter Type  Visited With Patient and family together  Visit Type Follow-up  Spiritual Encounters  Spiritual Needs Emotional  Stress Factors  Patient Stress Factors Health changes  Family Stress Factors Family relationships  Attempted to assist Pt compile AD. Pt unable cognitively nor physically.

## 2017-04-02 NOTE — Progress Notes (Signed)
  Speech Language Pathology Treatment: Cognitive-Linquistic  Patient Details Name: Kenneth Vazquez MRN: [id number] DOB: [birth year] Today's Date: 04/02/2017 Time: 1525-1540 SLP Time Calculation (min) (ACUTE ONLY): 15 min  Assessment / Plan / Recommendation Clinical Impression  SLP treatment provided for cognition. Pt continues to have hesitations/ stuttering when initiating speech; however, improved from yesterday as the pt was able to name items in the room without difficulty. Oriented to self/ place/ location, disoriented to time. Decreased recall of new information with showing pt call bell; able to accurately recall after reviewing 3 times. Daughter and son-in-law at bedside. Reported they feel the pt is at his baseline level of functioning at this time for cognition and communication. Pt will benefit from continued SLP services for functional problem solving and memory along with family education on strategies; may benefit from short-term services at next level of care as well for these skills. Will continue to follow.  HPI HPI: Pt is a 76 y.o. 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 He sometimes sneaks out of the bed without using his cane/walker. She heard him fall in the middle of the night and found him on the floor. She did not notice any injuries but he couldn't bear weight on his leg (unsure which one). It was hard to get him up and since then he speech wasn't like it was before - "sort of slow with it, dragging". He seemed to be ok through the weekend but he wasn't talking like she thought he could. CXR showed L base subsegmental atelectasis, head CT negative. Pt failed RN swallow screen due to not being able to lick top/ bottom lip. Bedside swallow and cognitive-linguistic eval ordered.      SLP Plan  Continue with current plan of care       Recommendations                   Oral Care Recommendations: Oral care BID Follow up Recommendations: 24 hour supervision/assistance;Other (comment) (TBD) SLP Visit Diagnosis: Cognitive communication deficit (W38.937) Plan: Continue with current plan of care       [place], [place], [place] 04/02/2017, 3:50 PM [phone]

## 2017-04-02 NOTE — Evaluation (Signed)
Occupational Therapy Evaluation Patient Details Name: Kenneth Vazquez MRN: [id number] DOB: 05-14-1941 Today's Date: 04/02/2017    History of Present Illness Pt is a 76 y/o male s/p Lt MCA CVA. PMH includes CVA x 2 in 2105, seizures, HTN, pacemaker, dementia, and carotid artery disease    Clinical Impression   PTA pt received assist from his wife for all ADL per notes. No family present during evaluation to determine how much assist with provided. Pt is currently presenting with decreased independence and is mod A for all seated ADL. Pt with delayed response time and problem solving deficits during lunch time, and pt expressing visual disturbance, but not able to attend for typical visual assessment. RUE (non-dominant) with decreased ROM and strength compared to LUE. Please see OT problem list below. Pt will require skilled OT in the acute care setting and CIR level therapy to maximize safety and independence in ADL and functional transfers as well as caregiver education.     Follow Up Recommendations  CIR;Supervision/Assistance - 24 hour    Equipment Recommendations  Other (comment) (defer to next venue)    Recommendations for Other Services Rehab consult     Precautions / Restrictions Precautions Precautions: Fall Restrictions Weight Bearing Restrictions: No      Mobility Bed Mobility            Pt sitting OOB in recliner when OT arrived  Transfers   NOt attempted this session              Balance Overall balance assessment: Needs assistance Sitting-balance support: Bilateral upper extremity supported;Feet supported Sitting balance-Leahy Scale: Poor     Standing balance support: Bilateral upper extremity supported;During functional activity Standing balance-Leahy Scale: Zero                            ADL either performed or assessed with clinical judgement   ADL Overall ADL's : Needs assistance/impaired Eating/Feeding: Moderate  assistance;Sitting Eating/Feeding Details (indicate cue type and reason): Pt unable to load fork with spaghetti, but able to use spoon to get mashed potatoes. With food that is not "sticky" Pt unable to keep it on the utensil to bring to mouth. Pt unable to bring cup and straw to a position independently to drink Grooming: Moderate assistance;Sitting   Upper Body Bathing: Moderate assistance;Sitting   Lower Body Bathing: Moderate assistance;Sitting/lateral leans   Upper Body Dressing : Maximal assistance   Lower Body Dressing: Moderate assistance;+2 for physical assistance;Sit to/from stand   Toilet Transfer: Maximal assistance;+2 for physical assistance;+2 for safety/equipment (per conversation with PT - not attempted this session)   Toileting- Clothing Manipulation and Hygiene: Maximal assistance       Functional mobility during ADLs: Maximal assistance;+2 for physical assistance;+2 for safety/equipment (per conversation with PT - not attempted this session) General ADL Comments: At baseline, Pt states that his wife assists with all ADL - This OT believes that the Pt can be more independent than his PTA baseline with intense therapy     Vision Patient Visual Report: Other (comment) (Pt says that it is different, but cannot share how) Vision Assessment?: Vision impaired- to be further tested in functional context Additional Comments: attempted to perform visual testing, but Pt was not able to follow commands appropriately. Pt seemed to have more difficulty with L superior     Perception Perception Perception Tested?: Yes Perception Deficits: Spatial orientation Spatial deficits: under shoots when reaching for items during lunch  Praxis      Pertinent Vitals/Pain Pain Assessment: No/denies pain     Hand Dominance Left   Extremity/Trunk Assessment Upper Extremity Assessment Upper Extremity Assessment: Defer to OT evaluation RUE Deficits / Details: grasp grossly 3/5,  shoulder forward flexion limited to approx 30 degrees, no abduction this session, and elbow flexion limited in functional context RUE Coordination: decreased fine motor;decreased gross motor   Lower Extremity Assessment Lower Extremity Assessment: Generalized weakness;RLE deficits/detail RLE Deficits / Details: More Rt LE weakness s/p L CVA. MMT <3/5 as pt unable to lift Rt LE against gravity    Cervical / Trunk Assessment Cervical / Trunk Assessment: Kyphotic   Communication Communication Communication: Expressive difficulties   Cognition Arousal/Alertness: Lethargic Behavior During Therapy: Flat affect Overall Cognitive Status: Impaired/Different from baseline Area of Impairment: Attention;Following commands;Safety/judgement;Awareness;Problem solving                   Current Attention Level: Sustained   Following Commands: Follows one step commands inconsistently Safety/Judgement: Decreased awareness of safety;Decreased awareness of deficits Awareness: Emergent Problem Solving: Slow processing;Decreased initiation;Difficulty sequencing;Requires tactile cues;Requires verbal cues     General Comments  no family during session to determine baseline or backup history    Exercises     Shoulder Instructions      Home Living Family/patient expects to be discharged to:: Private residence Living Arrangements: Spouse/significant other Available Help at Discharge: Available 24 hours/day;Family Type of Home: House Home Access: Stairs to enter [employer] of Steps: 4    Home Layout: One level     Bathroom Shower/Tub: Teacher, early years/pre: Standard Bathroom Accessibility: Yes   Home Equipment: Environmental consultant - 2 wheels;Kasandra Knudsen - single point      Lives With: Spouse    Prior Functioning/Environment Level of Independence: Needs assistance  Gait / Transfers Assistance Needed: PTA pt ambulated with RW, but not well (off balance most of the time)  ADL's /  Homemaking Assistance Needed: Wife provided asssist for all of pt's ADLs             OT Problem List: Decreased strength;Decreased range of motion;Decreased activity tolerance;Impaired balance (sitting and/or standing);Impaired vision/perception;Decreased coordination;Decreased cognition;Decreased safety awareness;Impaired UE functional use      OT Treatment/Interventions: Self-care/ADL training;Therapeutic exercise;DME and/or AE instruction;Therapeutic activities;Cognitive remediation/compensation;Visual/perceptual remediation/compensation;Patient/family education;Balance training    OT Goals(Current goals can be found in the care plan section) Acute Rehab OT Goals Patient Stated Goal: finish my lunch OT Goal Formulation: With patient Time For Goal Achievement: 04/16/17 Potential to Achieve Goals: Good ADL Goals Pt Will Perform Eating: with min guard assist;with caregiver independent in assisting;with adaptive utensils;sitting Pt Will Perform Grooming: with supervision;with caregiver independent in assisting;sitting Pt Will Perform Upper Body Bathing: with supervision;with caregiver independent in assisting;sitting Pt Will Perform Lower Body Bathing: with supervision;with caregiver independent in assisting;sitting/lateral leans Pt Will Transfer to Toilet: with min assist;stand pivot transfer;bedside commode (with caregiver independent in assisting; with RW) Pt Will Perform Toileting - Clothing Manipulation and hygiene: with min guard assist;with caregiver independent in assisting;sit to/from stand Pt/caregiver will Perform Home Exercise Program: Left upper extremity;With minimal assist;With written HEP provided;Increased ROM;Increased strength Additional ADL Goal #1: Pt will perform bed mobility as precursor to ADL with caregiver independent in assisting with supervision  OT Frequency: Min 3X/week   Barriers to D/C:    Pt's wife has been assisting him at home, and would likely benefit  from caregiver education       Co-evaluation  AM-PAC PT "6 Clicks" Daily Activity     Outcome Measure Help from another person eating meals?: A Lot Help from another person taking care of personal grooming?: A Lot Help from another person toileting, which includes using toliet, bedpan, or urinal?: A Lot Help from another person bathing (including washing, rinsing, drying)?: A Lot Help from another person to put on and taking off regular upper body clothing?: A Lot Help from another person to put on and taking off regular lower body clothing?: A Lot 6 Click Score: 12   End of Session Nurse Communication: Mobility status;Other (comment) (talked to NT about supervision for the rest of lunch)  Activity Tolerance: Patient tolerated treatment well Patient left: in chair;with call bell/phone within reach;with chair alarm set  OT Visit Diagnosis: Unsteadiness on feet (R26.81);Other abnormalities of gait and mobility (R26.89);Muscle weakness (generalized) (M62.81);Other symptoms and signs involving cognitive function;Cognitive communication deficit (R41.841);Hemiplegia and hemiparesis Symptoms and signs involving cognitive functions: Other cerebrovascular disease Hemiplegia - Right/Left: Right Hemiplegia - dominant/non-dominant: Non-Dominant Hemiplegia - caused by: Other cerebrovascular disease                Time: 1410-1447 OT Time Calculation (min): 37 min Charges:  OT Evaluation $OT Eval Moderate Complexity: 1 Procedure OT Treatments $Self Care/Home Management : 8-22 mins G-Codes:     Hulda Humphrey OTR/L [place] 04/02/2017, 6:02 PM

## 2017-04-02 NOTE — Consult Note (Deleted)
Entered in error

## 2017-04-02 NOTE — Plan of Care (Signed)
Discussed with cardiology PA, now reported no afib on pacemaker interrogation. If this is the case, no anticoagulation required at this time for stroke prevention. Will recommend ASA 325mg  daily for stroke prevention instead. Close follow up with cardiology for cardiomyopathy and pacemaker checking, if afib found, pt should be put on anticoagulation.   Rosalin Hawking, MD PhD Stroke Neurology 04/02/2017 3:39 PM

## 2017-04-02 NOTE — Progress Notes (Addendum)
PROGRESS NOTE    Kenneth Vazquez  RKY:[id number] DOB: 09/06/1940 DOA: 03/31/2017 PCP: Jodi Marble, MD    Brief Narrative: Kenneth Vazquez is a 76 y.o. male with a history of seizure disorder, stroke with residual right-sided weakness who was in his normal state of health until last Friday when his right side became weaker, and had a fall prior to admission. He was admitted for evaluation of stroke.   Assessment & Plan:   Principal Problem:   CVA (cerebral vascular accident) ([place]) Active Problems:   Seizures ([place])   Essential hypertension   Dementia   Severe protein-calorie malnutrition (HCC)   Normocytic anemia   Hyperlipidemia   Thrombocytopenia (HCC)  Right sided weakness sec to CVA  :   Initial CT head without contrast does not show any acute stroke. CT head and neck shows  Previous of right carotid stent. Minimal diameter in the proximal ICA region within the stent is 2.5 mm, consistent with a 50% stenosis.  MRI and MRA couldn't be done due to PPM.  LDL is 57, HDL is 33.  hgba1c is pending.  Echocardiogram shows left ventricular EF of 45% with grade 1 diastolic dysfunction.  PPM interrogation revealed A Fib, will get cardiology input for further recommendations.  Neurology consulted and recommendations given.  Therapy evals are pending. eliquis added along with  aspirin daily.    Seizures:  None since admission.  Resume keppra.    Normocytic anemia and mild thrombocytopenia:  Baseline hemoglobin between 11 to 10.  Monitor.    Dementia; CONFUSED today. Patient's wife at bedside, reports he is more confused today than usual.  Resume aricept. No agitation.    Hypertension:  Stable.  Permissive hypertension.   Hyperlipidemia:  LDL is 59 resume statin on discharge.   Protein energy malnutrition:  Nutrition consulted for recommendations.    DVT prophylaxis: (Lovenox) Code Status: DNR) Family Communication: discussed with wife over the phone.    Disposition Plan: pending further evaluation. Pending PT/OT Evaluation.   Consultants:   Neurology.   Cardiology.    Procedures: CT angio of the head and neck.   Echocardiogram.    Antimicrobials: none.    Subjective: No new complaints.  Reports he lives with his father,. Confused.  Objective: Vitals:   04/01/17 1600 04/01/17 2001 04/02/17 0008 04/02/17 0358  BP: (!) 151/80 (!) 161/80 (!) 154/90 (!) 146/84  Pulse: 91 64 67 62  Resp:  18 18 18   Temp:  98.4 F (36.9 C) 98.4 F (36.9 C) 98.3 F (36.8 C)  TempSrc:  Oral Oral Oral  SpO2:  100% 100% 99%  Weight:      Height:        Intake/Output Summary (Last 24 hours) at 04/02/17 0734 Last data filed at 04/02/17 0420  Gross per 24 hour  Intake           803.33 ml  Output              300 ml  Net           503.33 ml   Filed Weights   03/31/17 1156 04/01/17 1030  Weight: 72.6 kg (160 lb) 72.6 kg (160 lb)    Examination:  General exam: Appears calm and comfortablE not in distress   Respiratory system: Clear to auscultation. Respiratory effort normal. Cardiovascular system: S1 & S2 heard, RRR. No JVD, murmurs, rubs, gallops or clicks. No pedal edema. Gastrointestinal system: Abdomen is nondistended, soft and nontender. No organomegaly  or masses felt. Normal bowel sounds heard. Central nervous system: Alert and  Confused . Right sided hemiparesis. Dysarthria. Extremities: Symmetric 5 x 5 power. Skin: No rashes, lesions or ulcers Psychiatry: . Mood & affect appropriate.     Data Reviewed: I have personally reviewed following labs and imaging studies  CBC:  Recent Labs Lab 03/31/17 1242  WBC 4.5  NEUTROABS 3.0  HGB 9.3*  HCT 26.3*  MCV 85.7  PLT 644*   Basic Metabolic Panel:  Recent Labs Lab 03/31/17 1242  NA 136  K 3.8  CL 104  CO2 25  GLUCOSE 100*  BUN 16  CREATININE 0.79  CALCIUM 8.4*   GFR: Estimated Creatinine Clearance: 79.4 mL/min (by C-G formula based on SCr of 0.79  mg/dL). Liver Function Tests:  Recent Labs Lab 03/31/17 1242  AST 20  ALT 12*  ALKPHOS 40  BILITOT 0.7  PROT 6.6  ALBUMIN 2.5*   No results for input(s): LIPASE, AMYLASE in the last 168 hours. No results for input(s): AMMONIA in the last 168 hours. Coagulation Profile:  Recent Labs Lab 03/31/17 1242  INR 1.15   Cardiac Enzymes:  Recent Labs Lab 03/31/17 1242  TROPONINI <0.03   BNP (last 3 results) No results for input(s): PROBNP in the last 8760 hours. HbA1C:  Recent Labs  04/01/17 0411  HGBA1C 5.0   CBG: No results for input(s): GLUCAP in the last 168 hours. Lipid Profile:  Recent Labs  04/01/17 0411  CHOL 97  HDL 33*  LDLCALC 57  TRIG 35  CHOLHDL 2.9   Thyroid Function Tests: No results for input(s): TSH, T4TOTAL, FREET4, T3FREE, THYROIDAB in the last 72 hours. Anemia Panel: No results for input(s): VITAMINB12, FOLATE, FERRITIN, TIBC, IRON, RETICCTPCT in the last 72 hours. Sepsis Labs: No results for input(s): PROCALCITON, LATICACIDVEN in the last 168 hours.  No results found for this or any previous visit (from the past 240 hour(s)).       Radiology Studies: Ct Angio Head W Or Wo Contrast  Result Date: 04/01/2017 CLINICAL DATA:  Slurred speech.  Left-sided weakness. EXAM: CT ANGIOGRAPHY HEAD AND NECK TECHNIQUE: Multidetector CT imaging of the head and neck was performed using the standard protocol during bolus administration of intravenous contrast. Multiplanar CT image reconstructions and MIPs were obtained to evaluate the vascular anatomy. Carotid stenosis measurements (when applicable) are obtained utilizing NASCET criteria, using the distal internal carotid diameter as the denominator. CONTRAST:  50 cc Isovue 370 COMPARISON:  CT 03/31/2017.  MRI 02/09/2014. FINDINGS: CTA NECK FINDINGS Aortic arch: Aortic atherosclerosis. No evidence of dissection. Common origin of the innominate artery and left common carotid artery. Left vertebral artery  arises directly from the arch. Right carotid system: Common carotid artery is tortuous. There is distal atherosclerotic disease. There is a stent extending from the distal common carotid artery through the bifurcation and ICA bulb. Minimal diameter of the stent laminae in the ICA bulb region is measured at 2.5 mm. Expected diameter is 5 mm. Therefore, this indicates a 50% stenosis. External carotid remains patent. No ICA stenosis distal to the bulb. Left carotid system: Common carotid artery is tortuous but widely patent to the bifurcation region. Carotid bifurcation is widely patent. No stenosis or irregularity. Cervical internal carotid artery is tortuous but widely patent. Vertebral arteries: Left vertebral artery arises directly from the arch as noted above. This is a small vessel that is patent through the cervical region to the foramen magnum. Right vertebral artery is also small vessel with  normally positioned origin from the subclavian. The origin is widely patent in the vessel does not show any focal stenosis in the cervical region. Skeleton: Ordinary cervical spondylosis. Other neck: No mass or lymphadenopathy. Upper chest: Mild scarring.  No active process. Review of the MIP images confirms the above findings CTA HEAD FINDINGS Anterior circulation: Both internal carotid arteries are patent through the skullbase an siphon regions. There is atherosclerotic calcification in the carotid siphon regions but no stenosis greater than 30% suspected. There is a patent trigeminal artery on the left with atherosclerotic calcification resulting in stenosis of 50%. See below. The anterior and middle cerebral vessels are patent without proximal stenosis, aneurysm or vascular malformation. Posterior circulation: Small vertebral arteries are patent through the foramen magnum to the basilar. Posterior inferior cerebellar artery show flow bilaterally. The basilar is a very diminutive vessel. The majority of the distal  posterior circulation supply derives from the patent trigeminal artery on the left. The distal basilar artery is widely patent with good flow in the superior cerebellar and posterior cerebral territories. Venous sinuses: Patent and normal. Anatomic variants: Left trigeminal artery as discussed above. Delayed phase: No abnormal enhancement. Review of the MIP images confirms the above findings IMPRESSION: Previous of right carotid stent. Minimal diameter in the proximal ICA region within the stent is 2.5 mm, consistent with a 50% stenosis. Wide patency beyond that. Mild atherosclerotic change at the left carotid bifurcation but no stenosis or significant irregularity. Tortuous internal carotid arteries bilaterally. Aortic atherosclerosis. Atherosclerotic disease in both carotid siphon regions without stenosis greater than 30% on either side. 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 Diminutive vertebral arteries and small proximal basilar artery. Distal basilar supply drives primarily from the left trigeminal artery. No evidence of intracranial branch vessel occlusion. Electronically Signed   By: Nelson Chimes M.D.   On: 04/01/2017 10:15   Dg Chest 1 View  Result Date: 03/31/2017 CLINICAL DATA:  76 year old male post fall 2 days ago. Right-sided weakness and slurred speech. History stroke. Initial encounter. EXAM: CHEST 1 VIEW COMPARISON:  11/27/2016. FINDINGS: Sequential pacemaker enters from the left with leads unchanged in position appearing to be in the region of the right atrium and right ventricle. Cardiomegaly. Left base subsegmental atelectasis. No pulmonary edema or pneumothorax. Calcified slightly tortuous aorta. Bilateral acromioclavicular joint degenerative changes. No obvious rib fracture. IMPRESSION: Cardiomegaly with pacemaker in place. Left base subsegmental atelectasis. Electronically Signed   By: Genia Del M.D.   On: 03/31/2017  14:23   Ct Head Wo Contrast  Result Date: 03/31/2017 CLINICAL DATA:  Pain following fall. Slurred speech. Left mouth droop. Left-sided weakness EXAM: CT HEAD WITHOUT CONTRAST CT CERVICAL SPINE WITHOUT CONTRAST TECHNIQUE: Multidetector CT imaging of the head and cervical spine was performed following the standard protocol without intravenous contrast. Multiplanar CT image reconstructions of the cervical spine were also generated. COMPARISON:  Head CT November 27, 2016 FINDINGS: CT HEAD FINDINGS Brain: There is mild diffuse atrophy, stable. There is no intracranial mass, hemorrhage, extra-axial fluid collection, or midline shift. There is patchy small vessel disease in the centra semiovale bilaterally. There is a prior lacunar infarct in the right internal capsule just anterior to the genu. There is evidence of a prior small lacunar infarct in the head of the caudate nucleus on the right. There is evidence of a prior small infarct in the superior left cerebellum. There is no new gray-white compartment lesion. No acute infarct evident. Vascular:  There is no appreciable hyperdense vessel. There is calcification in each carotid siphon region. Skull: The bony calvarium appears intact. There is a minimal left superior frontal exostosis, benign and stable. Sinuses/Orbits: There is opacification in multiple ethmoid air cells bilaterally. No intraorbital lesions are identified. Patient appears to have had previous cataract removal on the right, stable. Other: Mastoid air cells are clear. CT CERVICAL SPINE FINDINGS Alignment:  There is no appreciable spondylolisthesis. Skull base and vertebrae: Skull base and craniocervical junction regions appear normal. No evident fracture. No blastic or lytic bone lesions. Soft tissues and spinal canal: Prevertebral soft tissues and predental space regions are normal. No paraspinous lesion. No cord or canal hematoma. Disc levels: There is moderately severe disc space narrowing at C4-5 and  C6-7. There is moderate disc space narrowing at C3-4, C5-6, and C7-T1. There are anterior osteophytes at all levels except for C2. There is facet hypertrophy at multiple levels. There is exit foraminal narrowing with impression on the exiting nerve root on the left at C3-4, on the left at C4-5, and on the left at C6-7. Milder exit foraminal narrowing is noted at C5-6 and C6-7 on the right. No frank disc extrusion or high-grade stenosis. Upper chest: Visualized lung apical regions are clear. Other: There is a stent in the right carotid artery. IMPRESSION: CT head: Stable atrophy with fairly extensive supratentorial small vessel disease, stable. Prior lacunar infarcts noted in the right basal ganglia and left cerebellum. No acute infarct. No hemorrhage, mass, or extra-axial fluid. Foci of arterial vascular calcification noted. Opacification in multiple ethmoid air cells. CT cervical spine: No fracture or spondylolisthesis. There is multilevel osteoarthritic change. There is a stent in the right carotid artery. Electronically Signed   By: Lowella Grip III M.D.   On: 03/31/2017 14:21   Ct Angio Neck W Or Wo Contrast  Result Date: 04/01/2017 CLINICAL DATA:  Slurred speech.  Left-sided weakness. EXAM: CT ANGIOGRAPHY HEAD AND NECK TECHNIQUE: Multidetector CT imaging of the head and neck was performed using the standard protocol during bolus administration of intravenous contrast. Multiplanar CT image reconstructions and MIPs were obtained to evaluate the vascular anatomy. Carotid stenosis measurements (when applicable) are obtained utilizing NASCET criteria, using the distal internal carotid diameter as the denominator. CONTRAST:  50 cc Isovue 370 COMPARISON:  CT 03/31/2017.  MRI 02/09/2014. FINDINGS: CTA NECK FINDINGS Aortic arch: Aortic atherosclerosis. No evidence of dissection. Common origin of the innominate artery and left common carotid artery. Left vertebral artery arises directly from the arch. Right  carotid system: Common carotid artery is tortuous. There is distal atherosclerotic disease. There is a stent extending from the distal common carotid artery through the bifurcation and ICA bulb. Minimal diameter of the stent laminae in the ICA bulb region is measured at 2.5 mm. Expected diameter is 5 mm. Therefore, this indicates a 50% stenosis. External carotid remains patent. No ICA stenosis distal to the bulb. Left carotid system: Common carotid artery is tortuous but widely patent to the bifurcation region. Carotid bifurcation is widely patent. No stenosis or irregularity. Cervical internal carotid artery is tortuous but widely patent. Vertebral arteries: Left vertebral artery arises directly from the arch as noted above. This is a small vessel that is patent through the cervical region to the foramen magnum. Right vertebral artery is also small vessel with normally positioned origin from the subclavian. The origin is widely patent in the vessel does not show any focal stenosis in the cervical region. Skeleton: Ordinary cervical spondylosis. Other  neck: No mass or lymphadenopathy. Upper chest: Mild scarring.  No active process. Review of the MIP images confirms the above findings CTA HEAD FINDINGS Anterior circulation: Both internal carotid arteries are patent through the skullbase an siphon regions. There is atherosclerotic calcification in the carotid siphon regions but no stenosis greater than 30% suspected. There is a patent trigeminal artery on the left with atherosclerotic calcification resulting in stenosis of 50%. See below. The anterior and middle cerebral vessels are patent without proximal stenosis, aneurysm or vascular malformation. Posterior circulation: Small vertebral arteries are patent through the foramen magnum to the basilar. Posterior inferior cerebellar artery show flow bilaterally. The basilar is a very diminutive vessel. The majority of the distal posterior circulation supply derives from  the patent trigeminal artery on the left. The distal basilar artery is widely patent with good flow in the superior cerebellar and posterior cerebral territories. Venous sinuses: Patent and normal. Anatomic variants: Left trigeminal artery as discussed above. Delayed phase: No abnormal enhancement. Review of the MIP images confirms the above findings IMPRESSION: Previous of right carotid stent. Minimal diameter in the proximal ICA region within the stent is 2.5 mm, consistent with a 50% stenosis. Wide patency beyond that. Mild atherosclerotic change at the left carotid bifurcation but no stenosis or significant irregularity. Tortuous internal carotid arteries bilaterally. Aortic atherosclerosis. Atherosclerotic disease in both carotid siphon regions without stenosis greater than 30% on either side. 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 Diminutive vertebral arteries and small proximal basilar artery. Distal basilar supply drives primarily from the left trigeminal artery. No evidence of intracranial branch vessel occlusion. Electronically Signed   By: Nelson Chimes M.D.   On: 04/01/2017 10:15   Ct Cervical Spine Wo Contrast  Result Date: 03/31/2017 CLINICAL DATA:  Pain following fall. Slurred speech. Left mouth droop. Left-sided weakness EXAM: CT HEAD WITHOUT CONTRAST CT CERVICAL SPINE WITHOUT CONTRAST TECHNIQUE: Multidetector CT imaging of the head and cervical spine was performed following the standard protocol without intravenous contrast. Multiplanar CT image reconstructions of the cervical spine were also generated. COMPARISON:  Head CT November 27, 2016 FINDINGS: CT HEAD FINDINGS Brain: There is mild diffuse atrophy, stable. There is no intracranial mass, hemorrhage, extra-axial fluid collection, or midline shift. There is patchy small vessel disease in the centra semiovale bilaterally. There is a prior lacunar infarct in the right internal  capsule just anterior to the genu. There is evidence of a prior small lacunar infarct in the head of the caudate nucleus on the right. There is evidence of a prior small infarct in the superior left cerebellum. There is no new gray-white compartment lesion. No acute infarct evident. Vascular: There is no appreciable hyperdense vessel. There is calcification in each carotid siphon region. Skull: The bony calvarium appears intact. There is a minimal left superior frontal exostosis, benign and stable. Sinuses/Orbits: There is opacification in multiple ethmoid air cells bilaterally. No intraorbital lesions are identified. Patient appears to have had previous cataract removal on the right, stable. Other: Mastoid air cells are clear. CT CERVICAL SPINE FINDINGS Alignment:  There is no appreciable spondylolisthesis. Skull base and vertebrae: Skull base and craniocervical junction regions appear normal. No evident fracture. No blastic or lytic bone lesions. Soft tissues and spinal canal: Prevertebral soft tissues and predental space regions are normal. No paraspinous lesion. No cord or canal hematoma. Disc levels: There is moderately severe disc space narrowing at C4-5 and C6-7. There is moderate disc space  narrowing at C3-4, C5-6, and C7-T1. There are anterior osteophytes at all levels except for C2. There is facet hypertrophy at multiple levels. There is exit foraminal narrowing with impression on the exiting nerve root on the left at C3-4, on the left at C4-5, and on the left at C6-7. Milder exit foraminal narrowing is noted at C5-6 and C6-7 on the right. No frank disc extrusion or high-grade stenosis. Upper chest: Visualized lung apical regions are clear. Other: There is a stent in the right carotid artery. IMPRESSION: CT head: Stable atrophy with fairly extensive supratentorial small vessel disease, stable. Prior lacunar infarcts noted in the right basal ganglia and left cerebellum. No acute infarct. No hemorrhage,  mass, or extra-axial fluid. Foci of arterial vascular calcification noted. Opacification in multiple ethmoid air cells. CT cervical spine: No fracture or spondylolisthesis. There is multilevel osteoarthritic change. There is a stent in the right carotid artery. Electronically Signed   By: Lowella Grip III M.D.   On: 03/31/2017 14:21        Scheduled Meds: . apixaban  5 mg Oral BID  . aspirin EC  81 mg Oral Daily  . atorvastatin  20 mg Oral q1800  . donepezil  10 mg Oral QHS   Continuous Infusions: . sodium chloride 1,000 mL (04/01/17 1722)  . levETIRAcetam Stopped (04/01/17 2144)     LOS: 2 days    Time spent: 72 min    Jimena Wieczorek, MD [facility] Pager [phone] If 7PM-7AM, please contact night-coverage [email or web address] Password [facility] 04/02/2017, 7:34 AM

## 2017-04-02 NOTE — Progress Notes (Signed)
Nutrition Follow-up  DOCUMENTATION CODES:   Non-severe (moderate) malnutrition in context of chronic illness  INTERVENTION:   -Continue Ensure Enlive po BID, each supplement provides 350 kcal and 20 grams of protein  NUTRITION DIAGNOSIS:   Malnutrition related to chronic illness (CVA, dementia) as evidenced by mild depletion of body fat, moderate depletion of body fat, mild depletion of muscle mass, moderate depletions of muscle mass, percent weight loss.  Ongoing  GOAL:   Patient will meet greater than or equal to 90% of their needs  Progressing  MONITOR:   PO intake, Supplement acceptance, Labs, Weight trends, Skin, I & O's  REASON FOR ASSESSMENT:   Consult, Malnutrition Screening Tool  (CVA)  ASSESSMENT:   76 y.o. Male with medical history significant of CVA x 2 in 2015; seizure d/o on Keppra with last seizure a couple of weeks ago; HTN; pacemaker placement; and dementia presenting with weakness on the right side since he fell on Friday night/Saturday morning.   Spoke with pt daughter and wife at bedside. Both endorse that pt's appetite has provided since hospitalization. They are unsure how much pt ate this AM but acknowledge he was receiving feeding assistance from staff. Meal completion 100% per doc flowsheets.   Pt wife shares that pt typically consume 2-3 meals per day (will usually eat two good meals per day- frequently consumed foods include jello, eggs, pancakes, and dry cereal). Per pt wife, pt often has good days and bad days with eating, but overall, pt has been eating less over the past 6 months. Pt also consumes approximately 1 Ensure supplement daily.   Pt wife shares UBW is around 170-180#. Noted pt has experienced a 11/1% wt loss over the past 4 months and a 5.6% wt loss over the past month, both of which are significant for time frame.   Nutrition-Focused physical exam completed. Findings are mild to moderate fat depletion, mild to moderate muscle  depletion, and no edema.   Discussed importance of good meal and supplement completion to promote healing. Discussed ways to increase protein in diet to preserve lean body mass. Pt wife concerned about pt's egg intake and cholesterol- discussed hpow to fit eggs into diet, along with other lean protein sources. Also encouraged diet liberalization when intake is poor.   Labs reviewed.   Diet Order:  Diet Heart Room service appropriate? Yes; Fluid consistency: Thin  Skin:  Reviewed, no issues  Last BM:  PTA  Height:   Ht Readings from Last 1 Encounters:  04/01/17 5\' 10"  (1.778 m)    Weight:   Wt Readings from Last 1 Encounters:  04/01/17 160 lb (72.6 kg)    Ideal Body Weight:  75.4 kg  BMI:  Body mass index is 22.96 kg/m.  Estimated Nutritional Needs:   Kcal:  1800-2000  Protein:  95-110 grams  Fluid:  1.8-2.0 L  EDUCATION NEEDS:   Education needs addressed  Dessirae Scarola A. Jimmye Norman, RD, LDN, CDE Pager: [phone] After hours Pager: [phone]

## 2017-04-02 NOTE — Evaluation (Signed)
Physical Therapy Evaluation Patient Details Name: Kenneth Vazquez MRN: [id number] DOB: Apr 10, 1941 Today's Date: 04/02/2017   History of Present Illness  Pt is a 76 y/o male s/p Lt MCA CVA. PMH includes CVA x 2 in 2105, seizures, HTN, pacemaker, dementia, and carotid artery disease   Clinical Impression  Upon arrival pt's wife and daughter present in room. Pt is lethargic and A&Ox1 (person). Neuro screen negative for spasticity or clonus. Difficult to assess sensation d/t pt's expressive communication deficits and cognitive deficits. Pt able to complete bed mobility and transfers; however has significant difficulty sequencing tasks and requires heavy physical assist. Wife states pt ambulated with RW PTA; however not well, as she states he was very off balance. Pt presents with deficits listed in PT problem list below and will benefit from continued acute therapy for neuroreeducation, mobilization, and strengthening. Wife states she will be able to supervise pt 24 hrs/day, but will not be able to physically assist. Feel CIR will be appropriate next step for pt progress.     Follow Up Recommendations CIR    Equipment Recommendations  None recommended by PT    Recommendations for Other Services OT consult     Precautions / Restrictions Precautions Precautions: Fall Restrictions Weight Bearing Restrictions: No      Mobility  Bed Mobility Overal bed mobility: Needs Assistance Bed Mobility: Supine to Sit     Supine to sit: Mod assist     General bed mobility comments: Mod A to rise and bring LEs off bed. Max VCs for sequencing. Pt was able to initiate scooting bottom, however not able to sequence movements.   Transfers Overall transfer level: Needs assistance   Transfers: Sit to/from Stand;Stand Pivot Transfers Sit to Stand: +2 physical assistance;Mod assist Stand pivot transfers: +2 physical assistance;Mod assist       General transfer comment: Max +2 for rise. Pt with  posterior lean and max VCs given for hand placement and sequencing   Ambulation/Gait                Stairs            Wheelchair Mobility    Modified Rankin (Stroke Patients Only)       Balance Overall balance assessment: Needs assistance Sitting-balance support: Bilateral upper extremity supported;Feet supported Sitting balance-Leahy Scale: Poor     Standing balance support: Bilateral upper extremity supported;During functional activity Standing balance-Leahy Scale: Zero Standing balance comment: Pt unable to stand on own even with bil UE supported with RW d/t impaired balance and weakness                              Pertinent Vitals/Pain Pain Assessment: No/denies pain    Home Living Family/patient expects to be discharged to:: Private residence Living Arrangements: Spouse/significant other Available Help at Discharge: Available 24 hours/day;Family Type of Home: House Home Access: Stairs to enter   [employer] of Steps: 4  Home Layout: One level Home Equipment: Environmental consultant - 2 wheels;Cane - single point      Prior Function Level of Independence: Needs assistance   Gait / Transfers Assistance Needed: PTA pt ambulated with RW, but not well (off balance most of the time)   ADL's / Homemaking Assistance Needed: Wife provided asssist for all of pt's ADLs         Hand Dominance   Dominant Hand: Left    Extremity/Trunk Assessment   Upper Extremity Assessment Upper  Extremity Assessment: Defer to OT evaluation RUE Deficits / Details: grasp grossly 3/5, shoulder forward flexion limited to approx 30 degrees, no abduction this session, and elbow flexion limited in functional context RUE Coordination: decreased fine motor;decreased gross motor    Lower Extremity Assessment Lower Extremity Assessment: Generalized weakness;RLE deficits/detail RLE Deficits / Details: More Rt LE weakness s/p L CVA. MMT <3/5 as pt unable to lift Rt LE  against gravity     Cervical / Trunk Assessment Cervical / Trunk Assessment: Kyphotic  Communication   Communication: Expressive difficulties  Cognition Arousal/Alertness: Lethargic Behavior During Therapy: Flat affect Overall Cognitive Status: Impaired/Different from baseline Area of Impairment: Attention;Following commands;Safety/judgement;Awareness;Problem solving                   Current Attention Level: Sustained   Following Commands: Follows one step commands inconsistently Safety/Judgement: Decreased awareness of safety;Decreased awareness of deficits Awareness: Emergent Problem Solving: Slow processing;Decreased initiation;Difficulty sequencing;Requires tactile cues;Requires verbal cues        General Comments      Exercises     Assessment/Plan    PT Assessment Patient needs continued PT services  PT Problem List Decreased strength;Decreased activity tolerance;Decreased balance;Decreased coordination;Decreased mobility;Decreased cognition;Decreased knowledge of use of DME;Decreased safety awareness;Decreased knowledge of precautions       PT Treatment Interventions DME instruction;Gait training;Stair training;Functional mobility training;Therapeutic activities;Therapeutic exercise;Balance training;Neuromuscular re-education;Cognitive remediation;Patient/family education    PT Goals (Current goals can be found in the Care Plan section)  Acute Rehab PT Goals PT Goal Formulation: Patient unable to participate in goal setting    Frequency Min 3X/week   Barriers to discharge        Co-evaluation               AM-PAC PT "6 Clicks" Daily Activity  Outcome Measure Difficulty turning over in bed (including adjusting bedclothes, sheets and blankets)?: Total Difficulty moving from lying on back to sitting on the side of the bed? : Total Difficulty sitting down on and standing up from a chair with arms (e.g., wheelchair, bedside commode, etc,.)?:  Total Help needed moving to and from a bed to chair (including a wheelchair)?: Total Help needed walking in hospital room?: Total Help needed climbing 3-5 steps with a railing? : Total 6 Click Score: 6    End of Session Equipment Utilized During Treatment: Gait belt Activity Tolerance: Patient limited by lethargy Patient left: in chair;with call bell/phone within reach;with chair alarm set;with family/visitor present Nurse Communication: Mobility status PT Visit Diagnosis: Unsteadiness on feet (R26.81);Other abnormalities of gait and mobility (R26.89);Muscle weakness (generalized) (M62.81);Difficulty in walking, not elsewhere classified (R26.2);Other symptoms and signs involving the nervous system (R29.898);Hemiplegia and hemiparesis Hemiplegia - Right/Left: Right Hemiplegia - dominant/non-dominant: Dominant Hemiplegia - caused by: Cerebral infarction    Time: 1229-1310 PT Time Calculation (min) (ACUTE ONLY): 41 min   Charges:   PT Evaluation $PT Eval Moderate Complexity: 1 Mod PT Treatments $Therapeutic Activity: 23-37 mins   PT G CodesElberta Leatherwood, SPT Acute Rehab [place] 04/02/2017, 5:47 PM

## 2017-04-03 DIAGNOSIS — F0150 Vascular dementia without behavioral disturbance: Secondary | ICD-10-CM

## 2017-04-03 DIAGNOSIS — I639 Cerebral infarction, unspecified: Principal | ICD-10-CM

## 2017-04-03 MED ORDER — ASPIRIN 325 MG PO TABS
325.0000 mg | ORAL_TABLET | Freq: Every day | ORAL | Status: DC
  Administered 2017-04-03 – 2017-04-07 (×5): 325 mg via ORAL
  Filled 2017-04-03 (×5): qty 1

## 2017-04-03 NOTE — Consult Note (Signed)
Physical Medicine and Rehabilitation Consult   Reason for Consult: Left brain stroke with increase in right sided weakness and worsening of cognition.  Referring Physician: Dr. Karleen Hampshire.    HPI: Kenneth Vazquez is a 76 y.o. male with history of HTN,  CVA with residual right hemiparesis, dementia, seizure disorder who was admitted on 03/31/17 with fall couple nights PTA with onset of slurred speech, increase sleepiness, increase in confusion and tendency to drag RLE.  CT head done revealing staple atrophy with fiarly extensive supratentorial small vessel disease and prior lacunar infarcts right BG and left cerebellum. CTA head/neck showed 50% stenosis within right ICA stent, atherosclerotic disease, diminutive VA and small proximal BA. 2/d echo revealed EF 45-50% with mild diffuse hypokinesis and mild to moderate AV regurgitation. Dr. Erlinda Hong felt that patient with left brain stroke likely due to question of A fib.  PPM interrogated and no signs of A fib per cardiology. Patient to continue on ASA with recommendations of anticoagulation if A fib found. Patient with confusion with difficulty with sequencing, expressive deficits, balance deficits with posterior lean and inabiltiy to walk on therapy evaluation. MD/Rehab team recommended CIR due to deficits in mobility and ability to carry out ADL tasks.    Review of Systems  Unable to perform ROS: Mental acuity      Past Medical History:  Diagnosis Date  . Arthritis   . Carotid artery disease ([place])   . Dementia   . Hypertension   . Pacemaker   . Prostate cancer ([place])   . Seizures ([place])    last seizure was a couple of weeks ago  . Stroke [facility]) x2, 2015   residual weakness, wife is unsure which side    Past Surgical History:  Procedure Laterality Date  . CAROTID STENT    . left total knee replacement    . PACEMAKER INSERTION    . PROSTATE CRYOABLATION      Family History  Problem Relation Age of Onset  . CVA Mother     Social  History:  Married. Was able to ambulate with walker per reports. Per reports that he has never smoked. He has never used smokeless tobacco. Per reports  he does not drink alcohol or use drugs.    Allergies  Allergen Reactions  . Sulfa Antibiotics Hives    Medications Prior to Admission  Medication Sig Dispense Refill  . aspirin EC 81 MG tablet Take 81 mg by mouth daily.    Marland Kitchen docusate sodium (COLACE) 100 MG capsule Take 100 mg by mouth daily.    Marland Kitchen donepezil (ARICEPT) 10 MG tablet Take 1 tablet (10 mg total) by mouth every morning. 30 tablet 0  . Ferrous Gluconate (IRON 27 PO) Take 1 tablet by mouth daily.    . furosemide (LASIX) 20 MG tablet Take 20 mg by mouth daily.    Marland Kitchen levETIRAcetam (KEPPRA) 500 MG tablet Take 500 mg by mouth 2 (two) times daily.    Marland Kitchen MYRBETRIQ 25 MG TB24 tablet Take 1 tablet by mouth daily.  11  . traZODone (DESYREL) 50 MG tablet Take 50-100 mg by mouth at bedtime.      Home: Home Living Family/patient expects to be discharged to:: Private residence Living Arrangements: Spouse/significant other Available Help at Discharge: Available 24 hours/day, Family Type of Home: House Home Access: Stairs to enter [employer] of Steps: [place]: One level Bathroom Shower/Tub: Chiropodist: Standard Bathroom Accessibility: Yes Home Equipment:  Walker - 2 wheels, [employer] - single point  Lives With: Spouse  Functional History: Prior Function Level of Independence: Needs assistance Gait / Transfers Assistance Needed: PTA pt ambulated with RW, but not well (off balance most of the time)  ADL's / Homemaking Assistance Needed: Wife provided asssist for all of pt's ADLs  Functional Status:  Mobility: Bed Mobility Overal bed mobility: Needs Assistance Bed Mobility: Supine to Sit Supine to sit: Mod assist General bed mobility comments: Mod A to rise and bring LEs off bed. Max VCs for sequencing. Pt was able to initiate scooting bottom,  however not able to sequence movements.  Transfers Overall transfer level: Needs assistance Transfers: Sit to/from Stand, Stand Pivot Transfers Sit to Stand: +2 physical assistance, Mod assist Stand pivot transfers: +2 physical assistance, Mod assist General transfer comment: Max +2 for rise. Pt with posterior lean and max VCs given for hand placement and sequencing       ADL: ADL Overall ADL's : Needs assistance/impaired Eating/Feeding: Moderate assistance, Sitting Eating/Feeding Details (indicate cue type and reason): Pt unable to load fork with spaghetti, but able to use spoon to get mashed potatoes. With food that is not "sticky" Pt unable to keep it on the utensil to bring to mouth. Pt unable to bring cup and straw to a position independently to drink Grooming: Moderate assistance, Sitting Upper Body Bathing: Moderate assistance, Sitting Lower Body Bathing: Moderate assistance, Sitting/lateral leans Upper Body Dressing : Maximal assistance Lower Body Dressing: Moderate assistance, +2 for physical assistance, Sit to/from stand Toilet Transfer: Maximal assistance, +2 for physical assistance, +2 for safety/equipment (per conversation with PT - not attempted this session) Toileting- Clothing Manipulation and Hygiene: Maximal assistance Functional mobility during ADLs: Maximal assistance, +2 for physical assistance, +2 for safety/equipment (per conversation with PT - not attempted this session) General ADL Comments: At baseline, Pt states that his wife assists with all ADL - This OT believes that the Pt can be more independent than his PTA baseline with intense therapy  Cognition: Cognition Overall Cognitive Status: Impaired/Different from baseline Arousal/Alertness: Awake/alert Orientation Level: Oriented to person, Disoriented to place, Disoriented to time, Disoriented to situation Attention: Selective, Alternating Selective Attention: Appears intact Alternating Attention:  Impaired Alternating Attention Impairment: Verbal basic, Functional basic Memory: Impaired Memory Impairment: Decreased recall of new information Awareness:  (some awareness of speech difficulty) Cognition Arousal/Alertness: Lethargic Behavior During Therapy: Flat affect Overall Cognitive Status: Impaired/Different from baseline Area of Impairment: Attention, Following commands, Safety/judgement, Awareness, Problem solving Current Attention Level: Sustained Following Commands: Follows one step commands inconsistently Safety/Judgement: Decreased awareness of safety, Decreased awareness of deficits Awareness: Emergent Problem Solving: Slow processing, Decreased initiation, Difficulty sequencing, Requires tactile cues, Requires verbal cues  Blood pressure (!) 167/66, pulse 64, temperature 98 F (36.7 C), temperature source Oral, resp. rate 16, height 5\' 10"  (1.778 m), weight 72.6 kg (160 lb), SpO2 100 %. Physical Exam  Constitutional: He appears well-developed.  HENT:  Head: Normocephalic.  Eyes: Pupils are equal, round, and reactive to light.  Neck: Neck supple.  Cardiovascular: Normal rate.   Respiratory: Effort normal.  GI: Soft.  Neurological:  Pt makes eye contact. Right central 7 and tongue deviation. Speech dysarthric. Chose his location as the hospital when given choices. Oriented to name as well, nothing else. Very delayed processing, lethargic. RUE and RLE grossly 3/5 but difficult to assess due lethargy during exam. LUE grossly 4/5. Sensed pain provocation on all 4's.   Psychiatric:  Flat, disengaged    Results  for orders placed or performed during the hospital encounter of 03/31/17 (from the past 24 hour(s))  CBC     Status: Abnormal   Collection Time: 04/02/17 11:59 AM  Result Value Ref Range   WBC 4.6 4.0 - 10.5 K/uL   RBC 3.12 (L) 4.22 - 5.81 MIL/uL   Hemoglobin 9.1 (L) 13.0 - 17.0 g/dL   HCT 26.2 (L) 39.0 - 52.0 %   MCV 84.0 78.0 - 100.0 fL   MCH 29.2 26.0 -  34.0 pg   MCHC 34.7 30.0 - 36.0 g/dL   RDW 14.7 11.5 - 15.5 %   Platelets 122 (L) 150 - 400 K/uL  Basic metabolic panel     Status: Abnormal   Collection Time: 04/02/17 11:59 AM  Result Value Ref Range   Sodium 138 135 - 145 mmol/L   Potassium 3.6 3.5 - 5.1 mmol/L   Chloride 108 101 - 111 mmol/L   CO2 24 22 - 32 mmol/L   Glucose, Bld 124 (H) 65 - 99 mg/dL   BUN 11 6 - 20 mg/dL   Creatinine, Ser 0.84 0.61 - 1.24 mg/dL   Calcium 7.7 (L) 8.9 - 10.3 mg/dL   GFR calc non Af Amer >60 >60 mL/min   GFR calc Af Amer >60 >60 mL/min   Anion gap 6 5 - 15   No results found.  Assessment/Plan: Diagnosis: left basal ganglia infarct with right hemiparesis, speech and cognitive deficits 1. Does the need for close, 24 hr/day medical supervision in concert with the patient's rehab needs make it unreasonable for this patient to be served in a less intensive setting? Yes 2. Co-Morbidities requiring supervision/potential complications: hx dementia, htn, post-stroke sequelae 3. Due to bladder management, bowel management, safety, skin/wound care, disease management, medication administration, pain management and patient education, does the patient require 24 hr/day rehab nursing? Yes 4. Does the patient require coordinated care of a physician, rehab nurse, PT (1-2 hrs/day, 5 days/week), OT (1-2 hrs/day, 5 days/week) and SLP (1-2 hrs/day, 5 days/week) to address physical and functional deficits in the context of the above medical diagnosis(es)? Yes Addressing deficits in the following areas: balance, endurance, locomotion, strength, transferring, bowel/bladder control, bathing, dressing, feeding, grooming, toileting, cognition, speech, swallowing and psychosocial support 5. Can the patient actively participate in an intensive therapy program of at least 3 hrs of therapy per day at least 5 days per week? Yes 6. The potential for patient to make measurable gains while on inpatient rehab is good 7. Anticipated  functional outcomes upon discharge from inpatient rehab are supervision  with PT, supervision and min assist with OT, supervision and min assist with SLP. 8. Estimated rehab length of stay to reach the above functional goals is: 12-17 days 9. Anticipated D/C setting: Home 10. Anticipated post D/C treatments: [place] therapy 11. Overall Rehab/Functional Prognosis: good  RECOMMENDATIONS: This patient's condition is appropriate for continued rehabilitative care in the following setting: CIR Patient has agreed to participate in recommended program. N/A Note that insurance prior authorization may be required for reimbursement for recommended care.  Comment: Family appears supportive. Pt was ambulatory with RW prior to the stroke. Rehab Admissions Coordinator to follow up.  Thanks,  Meredith Staggers, MD, Tilford Pillar, PA-C 04/03/2017

## 2017-04-03 NOTE — Progress Notes (Signed)
PROGRESS NOTE    Kenneth Vazquez  XBM:[id number] DOB: 1940-10-17 DOA: 03/31/2017 PCP: Jodi Marble, MD    Brief Narrative: Kenneth Vazquez is a 76 y.o. male with a history of seizure disorder, stroke with residual right-sided weakness who was in his normal state of health until last Friday when his right side became weaker, and had a fall prior to admission. He was admitted for evaluation of stroke.   Assessment & Plan:   Principal Problem:   CVA (cerebral vascular accident) ([place]) Active Problems:   Seizures ([place])   Essential hypertension   Dementia   Severe protein-calorie malnutrition (H[facility])   Normocytic anemia   Hyperlipidemia   Thrombocytopenia ([facility])   Malnutrition of moderate degree  Right sided weakness sec to CVA  :   Initial CT head without contrast does not show any acute stroke. CT head and neck shows  Previous of right carotid stent. Minimal diameter in the proximal ICA region within the stent is 2.5 mm, consistent with a 50% stenosis.  MRI and MRA couldn't be done due to PPM.  LDL is 57, HDL is 33.  hgba1c is pending.  Echocardiogram shows left ventricular EF of 45% with grade 1 diastolic dysfunction.  PPM interrogation revealed Atrial tachycardia, no flutter and fib.  Neurology consulted and recommendations given.  Therapy evals recommending inpatient rehab.  Inpatient rehab consulted .    Seizures:  None since admission.  Resume keppra.    Normocytic anemia and mild thrombocytopenia:  Baseline hemoglobin between 11 to 10.  Monitor.    Dementia; .  Resume aricept. No agitation.    Hypertension:  Stable.  Permissive hypertension.   Hyperlipidemia:  LDL is 59 resume statin on discharge.   Protein energy malnutrition:  Nutrition consulted for recommendations.    DVT prophylaxis: (Lovenox) Code Status: DNR) Family Communication: discussed with wife over the phone.  Disposition Plan:inpatient rehab in am. .   Consultants:    Neurology.   Cardiology.    Procedures: CT angio of the head and neck.   Echocardiogram.    Antimicrobials: none.    Subjective: No new complaiants. No chest pain or sob. No weakness.  Objective: Vitals:   04/02/17 2205 04/03/17 0205 04/03/17 0500 04/03/17 1616  BP: (!) 172/87 (!) 186/86 (!) 167/66 (!) 157/85  Pulse: 61 61 64 68  Resp: 16 16 16 16   Temp: 99.1 F (37.3 C) 97.9 F (36.6 C) 98 F (36.7 C)   TempSrc:   Oral   SpO2: 100% 100% 100% 98%  Weight:      Height:        Intake/Output Summary (Last 24 hours) at 04/03/17 1837 Last data filed at 04/03/17 1617  Gross per 24 hour  Intake          1199.17 ml  Output             1850 ml  Net          -650.83 ml   Filed Weights   03/31/17 1156 04/01/17 1030  Weight: 72.6 kg (160 lb) 72.6 kg (160 lb)    Examination:  General exam: Appears calm and comfortable. No new complaints.  Respiratory system: Clear to auscultation. Respiratory effort normal. No wheeizng or rhonchi.  Cardiovascular system: S1 & S2 heard, RRR. No JVD, murmurs, rubs, gallops or clicks. No pedal edema. Gastrointestinal system: Abdomen is soft NT nd bs+ Central nervous system: Alert and  Confused . Right sided hemiparesis. Dysarthria. Extremities: Symmetric 5 x  5 power. Skin: No rashes, lesions or ulcers Psychiatry: . Mood & affect appropriate.     Data Reviewed: I have personally reviewed following labs and imaging studies  CBC:  Recent Labs Lab 03/31/17 1242 04/02/17 1159  WBC 4.5 4.6  NEUTROABS 3.0  --   HGB 9.3* 9.1*  HCT 26.3* 26.2*  MCV 85.7 84.0  PLT 136* 275*   Basic Metabolic Panel:  Recent Labs Lab 03/31/17 1242 04/02/17 1159  NA 136 138  K 3.8 3.6  CL 104 108  CO2 25 24  GLUCOSE 100* 124*  BUN 16 11  CREATININE 0.79 0.84  CALCIUM 8.4* 7.7*   GFR: Estimated Creatinine Clearance: 75.6 mL/min (by C-G formula based on SCr of 0.84 mg/dL). Liver Function Tests:  Recent Labs Lab 03/31/17 1242  AST 20   ALT 12*  ALKPHOS 40  BILITOT 0.7  PROT 6.6  ALBUMIN 2.5*   No results for input(s): LIPASE, AMYLASE in the last 168 hours. No results for input(s): AMMONIA in the last 168 hours. Coagulation Profile:  Recent Labs Lab 03/31/17 1242  INR 1.15   Cardiac Enzymes:  Recent Labs Lab 03/31/17 1242  TROPONINI <0.03   BNP (last 3 results) No results for input(s): PROBNP in the last 8760 hours. HbA1C:  Recent Labs  04/01/17 0411  HGBA1C 5.0   CBG: No results for input(s): GLUCAP in the last 168 hours. Lipid Profile:  Recent Labs  04/01/17 0411  CHOL 97  HDL 33*  LDLCALC 57  TRIG 35  CHOLHDL 2.9   Thyroid Function Tests: No results for input(s): TSH, T4TOTAL, FREET4, T3FREE, THYROIDAB in the last 72 hours. Anemia Panel: No results for input(s): VITAMINB12, FOLATE, FERRITIN, TIBC, IRON, RETICCTPCT in the last 72 hours. Sepsis Labs: No results for input(s): PROCALCITON, LATICACIDVEN in the last 168 hours.  Recent Results (from the past 240 hour(s))  Urine culture     Status: Abnormal   Collection Time: 03/31/17 12:31 PM  Result Value Ref Range Status   Specimen Description URINE, RANDOM  Final   Special Requests NONE  Final   Culture (A)  Final    <10,000 COLONIES/mL INSIGNIFICANT GROWTH Performed at [facility], [address], [place], [place] [address]    Report Status 04/02/2017 FINAL  Final         Radiology Studies: No results found.      Scheduled Meds: . aspirin  325 mg Oral Daily  . atorvastatin  20 mg Oral q1800  . donepezil  10 mg Oral QHS  . feeding supplement (ENSURE ENLIVE)  237 mL Oral BID BM   Continuous Infusions: . sodium chloride 1,000 mL (04/02/17 1610)  . levETIRAcetam Stopped (04/03/17 1025)     LOS: 3 days    Time spent: 55 min    Zabdi Mis, MD [facility] Pager [phone] If 7PM-7AM, please contact night-coverage [email or web address] Password [facility] 04/03/2017, 6:37 PM

## 2017-04-03 NOTE — Progress Notes (Signed)
Rehab Admissions Coordinator Note:  Patient was screened by Retta Diones for appropriateness for an Inpatient Acute Rehab Consult.  At this time, we are recommending Inpatient Rehab consult.  Jodell Cipro M 04/03/2017, 10:28 AM  I can be reached at [phone].

## 2017-04-04 LAB — CBC
HEMATOCRIT: 28.3 % — AB (ref 39.0–52.0)
HEMOGLOBIN: 9.9 g/dL — AB (ref 13.0–17.0)
MCH: 29.3 pg (ref 26.0–34.0)
MCHC: 35 g/dL (ref 30.0–36.0)
MCV: 83.7 fL (ref 78.0–100.0)
Platelets: 134 10*3/uL — ABNORMAL LOW (ref 150–400)
RBC: 3.38 MIL/uL — ABNORMAL LOW (ref 4.22–5.81)
RDW: 14.6 % (ref 11.5–15.5)
WBC: 6.3 10*3/uL (ref 4.0–10.5)

## 2017-04-04 MED ORDER — AMLODIPINE BESYLATE 2.5 MG PO TABS
2.5000 mg | ORAL_TABLET | Freq: Every day | ORAL | Status: DC
  Administered 2017-04-04 – 2017-04-07 (×4): 2.5 mg via ORAL
  Filled 2017-04-04 (×4): qty 1

## 2017-04-04 MED ORDER — LEVETIRACETAM 750 MG PO TABS
750.0000 mg | ORAL_TABLET | Freq: Two times a day (BID) | ORAL | Status: DC
  Administered 2017-04-04 – 2017-04-07 (×6): 750 mg via ORAL
  Filled 2017-04-04 (×6): qty 1

## 2017-04-04 MED ORDER — HYDRALAZINE HCL 20 MG/ML IJ SOLN
10.0000 mg | Freq: Four times a day (QID) | INTRAMUSCULAR | Status: DC | PRN
  Administered 2017-04-04: 10 mg via INTRAVENOUS
  Filled 2017-04-04 (×2): qty 1

## 2017-04-04 NOTE — Care Management Important Message (Signed)
Important Message  Patient Details  Name: Kenneth Vazquez MRN: [id number] Date of Birth: March 24, 1941   Medicare Important Message Given:  Yes    Danetta Prom Abena 04/04/2017, 10:30 AM

## 2017-04-04 NOTE — Discharge Summary (Signed)
Physician Discharge Summary  Kenneth Vazquez SNK:[id number] DOB: Jul 18, 1941 DOA: 03/31/2017  PCP: Jodi Marble, MD  Admit date: 03/31/2017 Discharge date: 04/05/2017  Admitted From: Home.  Disposition: SNF  Recommendations for Outpatient Follow-up:  1. Follow up with PCP in 1-2 weeks 2. Please obtain BMP/CBC in one week Please follow up  With neurology as recommended.  Please follow up with cardiology in 2 weeks .   Discharge Condition:STABLE.  CODE STATUS: DNR Diet recommendation: Heart Healthy   Brief/Interim Summary: [name] a 76 y.o.malewith a history of seizure disorder, stroke with residual right-sided weakness who was in his normal state of health until last Friday when his right side became weaker, and had a fall prior to admission. He was admitted for evaluation of stroke. NEUROLOGY consulted and recommendations given.   Discharge Diagnoses:  Principal Problem:   CVA (cerebral vascular accident) ([place]) Active Problems:   Seizures ([place])   Essential hypertension   Dementia   Severe protein-calorie malnutrition ([facility])   Normocytic anemia   Hyperlipidemia   Thrombocytopenia ([facility])   Malnutrition of moderate degree  Right sided weakness sec to CVA  :   Initial CT head without contrast does not show any acute stroke. CT head and neck shows  Previous of right carotid stent. Minimal diameter in the proximal ICA region within the stent is 2.5 mm, consistent with a 50% stenosis.  MRI and MRA couldn't be done due to PPM.  LDL is 57, HDL is 33.  hgba1c is pending.  Echocardiogram shows left ventricular EF of 45% with grade 1 diastolic dysfunction.  PPM interrogation revealed Atrial tachycardia, no flutter and fib.  Neurology consulted and recommendations given. Plan to discharge patient on 325 mg daily of aspirin daily Therapy evals recommending inpatient rehab.  Inpatient rehab consulted . Did not get insurance approval, so plan for SNF later today to  tomorrow.    Seizures:  None since admission.  Resume keppra.    Normocytic anemia and mild thrombocytopenia:  Baseline hemoglobin between 11 to 10.  No complaints. No signs of bleeding.     Dementia; Resume aricept. No agitation.    Hypertension:  Stable.  Permissive hypertension.  Resume amlodipine.   Hyperlipidemia:  LDL is 59 resume statin on discharge.   Protein energy malnutrition:  Nutrition consulted for recommendations.     Discharge Instructions  Discharge Instructions    Ambulatory referral to Neurology    Complete by:  As directed    Pt will follow up with Dr. Manuella Ghazi at [facility] in about 6 weekes. Thanks.      Follow-up Information    Kenneth Crofts, MD. Schedule an appointment as soon as possible for a visit in 6 week(s).   Specialty:  Neurology Contact information: [facility] [place] [id number] [phone]          Allergies  Allergen Reactions  . Sulfa Antibiotics Hives    Consultations:  Neurology   cardiology   Procedures/Studies: Ct Angio Head W Or Wo Contrast  Result Date: 04/01/2017 CLINICAL DATA:  Slurred speech.  Left-sided weakness. EXAM: CT ANGIOGRAPHY HEAD AND NECK TECHNIQUE: Multidetector CT imaging of the head and neck was performed using the standard protocol during bolus administration of intravenous contrast. Multiplanar CT image reconstructions and MIPs were obtained to evaluate the vascular anatomy. Carotid stenosis measurements (when applicable) are obtained utilizing NASCET criteria, using the distal internal carotid diameter as the denominator. CONTRAST:  50 cc Isovue  370 COMPARISON:  CT 03/31/2017.  MRI 02/09/2014. FINDINGS: CTA NECK FINDINGS Aortic arch: Aortic atherosclerosis. No evidence of dissection. Common origin of the innominate artery and left common carotid artery. Left vertebral artery arises directly from the arch. Right carotid system:  Common carotid artery is tortuous. There is distal atherosclerotic disease. There is a stent extending from the distal common carotid artery through the bifurcation and ICA bulb. Minimal diameter of the stent laminae in the ICA bulb region is measured at 2.5 mm. Expected diameter is 5 mm. Therefore, this indicates a 50% stenosis. External carotid remains patent. No ICA stenosis distal to the bulb. Left carotid system: Common carotid artery is tortuous but widely patent to the bifurcation region. Carotid bifurcation is widely patent. No stenosis or irregularity. Cervical internal carotid artery is tortuous but widely patent. Vertebral arteries: Left vertebral artery arises directly from the arch as noted above. This is a small vessel that is patent through the cervical region to the foramen magnum. Right vertebral artery is also small vessel with normally positioned origin from the subclavian. The origin is widely patent in the vessel does not show any focal stenosis in the cervical region. Skeleton: Ordinary cervical spondylosis. Other neck: No mass or lymphadenopathy. Upper chest: Mild scarring.  No active process. Review of the MIP images confirms the above findings CTA HEAD FINDINGS Anterior circulation: Both internal carotid arteries are patent through the skullbase an siphon regions. There is atherosclerotic calcification in the carotid siphon regions but no stenosis greater than 30% suspected. There is a patent trigeminal artery on the left with atherosclerotic calcification resulting in stenosis of 50%. See below. The anterior and middle cerebral vessels are patent without proximal stenosis, aneurysm or vascular malformation. Posterior circulation: Small vertebral arteries are patent through the foramen magnum to the basilar. Posterior inferior cerebellar artery show flow bilaterally. The basilar is a very diminutive vessel. The majority of the distal posterior circulation supply derives from the patent  trigeminal artery on the left. The distal basilar artery is widely patent with good flow in the superior cerebellar and posterior cerebral territories. Venous sinuses: Patent and normal. Anatomic variants: Left trigeminal artery as discussed above. Delayed phase: No abnormal enhancement. Review of the MIP images confirms the above findings IMPRESSION: Previous of right carotid stent. Minimal diameter in the proximal ICA region within the stent is 2.5 mm, consistent with a 50% stenosis. Wide patency beyond that. Mild atherosclerotic change at the left carotid bifurcation but no stenosis or significant irregularity. Tortuous internal carotid arteries bilaterally. Aortic atherosclerosis. Atherosclerotic disease in both carotid siphon regions without stenosis greater than 30% on either side. 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 Diminutive vertebral arteries and small proximal basilar artery. Distal basilar supply drives primarily from the left trigeminal artery. No evidence of intracranial branch vessel occlusion. Electronically Signed   By: Nelson Chimes M.D.   On: 04/01/2017 10:15   Dg Chest 1 View  Result Date: 03/31/2017 CLINICAL DATA:  76 year old male post fall 2 days ago. Right-sided weakness and slurred speech. History stroke. Initial encounter. EXAM: CHEST 1 VIEW COMPARISON:  11/27/2016. FINDINGS: Sequential pacemaker enters from the left with leads unchanged in position appearing to be in the region of the right atrium and right ventricle. Cardiomegaly. Left base subsegmental atelectasis. No pulmonary edema or pneumothorax. Calcified slightly tortuous aorta. Bilateral acromioclavicular joint degenerative changes. No obvious rib fracture. IMPRESSION: Cardiomegaly with pacemaker in place. Left base subsegmental atelectasis. Electronically  Signed   By: Genia Del M.D.   On: 03/31/2017 14:23   Ct Head Wo Contrast  Result Date:  03/31/2017 CLINICAL DATA:  Pain following fall. Slurred speech. Left mouth droop. Left-sided weakness EXAM: CT HEAD WITHOUT CONTRAST CT CERVICAL SPINE WITHOUT CONTRAST TECHNIQUE: Multidetector CT imaging of the head and cervical spine was performed following the standard protocol without intravenous contrast. Multiplanar CT image reconstructions of the cervical spine were also generated. COMPARISON:  Head CT November 27, 2016 FINDINGS: CT HEAD FINDINGS Brain: There is mild diffuse atrophy, stable. There is no intracranial mass, hemorrhage, extra-axial fluid collection, or midline shift. There is patchy small vessel disease in the centra semiovale bilaterally. There is a prior lacunar infarct in the right internal capsule just anterior to the genu. There is evidence of a prior small lacunar infarct in the head of the caudate nucleus on the right. There is evidence of a prior small infarct in the superior left cerebellum. There is no new gray-white compartment lesion. No acute infarct evident. Vascular: There is no appreciable hyperdense vessel. There is calcification in each carotid siphon region. Skull: The bony calvarium appears intact. There is a minimal left superior frontal exostosis, benign and stable. Sinuses/Orbits: There is opacification in multiple ethmoid air cells bilaterally. No intraorbital lesions are identified. Patient appears to have had previous cataract removal on the right, stable. Other: Mastoid air cells are clear. CT CERVICAL SPINE FINDINGS Alignment:  There is no appreciable spondylolisthesis. Skull base and vertebrae: Skull base and craniocervical junction regions appear normal. No evident fracture. No blastic or lytic bone lesions. Soft tissues and spinal canal: Prevertebral soft tissues and predental space regions are normal. No paraspinous lesion. No cord or canal hematoma. Disc levels: There is moderately severe disc space narrowing at C4-5 and C6-7. There is moderate disc space narrowing  at C3-4, C5-6, and C7-T1. There are anterior osteophytes at all levels except for C2. There is facet hypertrophy at multiple levels. There is exit foraminal narrowing with impression on the exiting nerve root on the left at C3-4, on the left at C4-5, and on the left at C6-7. Milder exit foraminal narrowing is noted at C5-6 and C6-7 on the right. No frank disc extrusion or high-grade stenosis. Upper chest: Visualized lung apical regions are clear. Other: There is a stent in the right carotid artery. IMPRESSION: CT head: Stable atrophy with fairly extensive supratentorial small vessel disease, stable. Prior lacunar infarcts noted in the right basal ganglia and left cerebellum. No acute infarct. No hemorrhage, mass, or extra-axial fluid. Foci of arterial vascular calcification noted. Opacification in multiple ethmoid air cells. CT cervical spine: No fracture or spondylolisthesis. There is multilevel osteoarthritic change. There is a stent in the right carotid artery. Electronically Signed   By: Lowella Grip III M.D.   On: 03/31/2017 14:21   Ct Angio Neck W Or Wo Contrast  Result Date: 04/01/2017 CLINICAL DATA:  Slurred speech.  Left-sided weakness. EXAM: CT ANGIOGRAPHY HEAD AND NECK TECHNIQUE: Multidetector CT imaging of the head and neck was performed using the standard protocol during bolus administration of intravenous contrast. Multiplanar CT image reconstructions and MIPs were obtained to evaluate the vascular anatomy. Carotid stenosis measurements (when applicable) are obtained utilizing NASCET criteria, using the distal internal carotid diameter as the denominator. CONTRAST:  50 cc Isovue 370 COMPARISON:  CT 03/31/2017.  MRI 02/09/2014. FINDINGS: CTA NECK FINDINGS Aortic arch: Aortic atherosclerosis. No evidence of dissection. Common origin of the innominate artery and left common  carotid artery. Left vertebral artery arises directly from the arch. Right carotid system: Common carotid artery is  tortuous. There is distal atherosclerotic disease. There is a stent extending from the distal common carotid artery through the bifurcation and ICA bulb. Minimal diameter of the stent laminae in the ICA bulb region is measured at 2.5 mm. Expected diameter is 5 mm. Therefore, this indicates a 50% stenosis. External carotid remains patent. No ICA stenosis distal to the bulb. Left carotid system: Common carotid artery is tortuous but widely patent to the bifurcation region. Carotid bifurcation is widely patent. No stenosis or irregularity. Cervical internal carotid artery is tortuous but widely patent. Vertebral arteries: Left vertebral artery arises directly from the arch as noted above. This is a small vessel that is patent through the cervical region to the foramen magnum. Right vertebral artery is also small vessel with normally positioned origin from the subclavian. The origin is widely patent in the vessel does not show any focal stenosis in the cervical region. Skeleton: Ordinary cervical spondylosis. Other neck: No mass or lymphadenopathy. Upper chest: Mild scarring.  No active process. Review of the MIP images confirms the above findings CTA HEAD FINDINGS Anterior circulation: Both internal carotid arteries are patent through the skullbase an siphon regions. There is atherosclerotic calcification in the carotid siphon regions but no stenosis greater than 30% suspected. There is a patent trigeminal artery on the left with atherosclerotic calcification resulting in stenosis of 50%. See below. The anterior and middle cerebral vessels are patent without proximal stenosis, aneurysm or vascular malformation. Posterior circulation: Small vertebral arteries are patent through the foramen magnum to the basilar. Posterior inferior cerebellar artery show flow bilaterally. The basilar is a very diminutive vessel. The majority of the distal posterior circulation supply derives from the patent trigeminal artery on the left.  The distal basilar artery is widely patent with good flow in the superior cerebellar and posterior cerebral territories. Venous sinuses: Patent and normal. Anatomic variants: Left trigeminal artery as discussed above. Delayed phase: No abnormal enhancement. Review of the MIP images confirms the above findings IMPRESSION: Previous of right carotid stent. Minimal diameter in the proximal ICA region within the stent is 2.5 mm, consistent with a 50% stenosis. Wide patency beyond that. Mild atherosclerotic change at the left carotid bifurcation but no stenosis or significant irregularity. Tortuous internal carotid arteries bilaterally. Aortic atherosclerosis. Atherosclerotic disease in both carotid siphon regions without stenosis greater than 30% on either side. 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 Diminutive vertebral arteries and small proximal basilar artery. Distal basilar supply drives primarily from the left trigeminal artery. No evidence of intracranial branch vessel occlusion. Electronically Signed   By: Nelson Chimes M.D.   On: 04/01/2017 10:15   Ct Cervical Spine Wo Contrast  Result Date: 03/31/2017 CLINICAL DATA:  Pain following fall. Slurred speech. Left mouth droop. Left-sided weakness EXAM: CT HEAD WITHOUT CONTRAST CT CERVICAL SPINE WITHOUT CONTRAST TECHNIQUE: Multidetector CT imaging of the head and cervical spine was performed following the standard protocol without intravenous contrast. Multiplanar CT image reconstructions of the cervical spine were also generated. COMPARISON:  Head CT November 27, 2016 FINDINGS: CT HEAD FINDINGS Brain: There is mild diffuse atrophy, stable. There is no intracranial mass, hemorrhage, extra-axial fluid collection, or midline shift. There is patchy small vessel disease in the centra semiovale bilaterally. There is a prior lacunar infarct in the right internal capsule just anterior to the genu. There is  evidence of a prior small lacunar infarct in the head of the caudate nucleus on the right. There is evidence of a prior small infarct in the superior left cerebellum. There is no new gray-white compartment lesion. No acute infarct evident. Vascular: There is no appreciable hyperdense vessel. There is calcification in each carotid siphon region. Skull: The bony calvarium appears intact. There is a minimal left superior frontal exostosis, benign and stable. Sinuses/Orbits: There is opacification in multiple ethmoid air cells bilaterally. No intraorbital lesions are identified. Patient appears to have had previous cataract removal on the right, stable. Other: Mastoid air cells are clear. CT CERVICAL SPINE FINDINGS Alignment:  There is no appreciable spondylolisthesis. Skull base and vertebrae: Skull base and craniocervical junction regions appear normal. No evident fracture. No blastic or lytic bone lesions. Soft tissues and spinal canal: Prevertebral soft tissues and predental space regions are normal. No paraspinous lesion. No cord or canal hematoma. Disc levels: There is moderately severe disc space narrowing at C4-5 and C6-7. There is moderate disc space narrowing at C3-4, C5-6, and C7-T1. There are anterior osteophytes at all levels except for C2. There is facet hypertrophy at multiple levels. There is exit foraminal narrowing with impression on the exiting nerve root on the left at C3-4, on the left at C4-5, and on the left at C6-7. Milder exit foraminal narrowing is noted at C5-6 and C6-7 on the right. No frank disc extrusion or high-grade stenosis. Upper chest: Visualized lung apical regions are clear. Other: There is a stent in the right carotid artery. IMPRESSION: CT head: Stable atrophy with fairly extensive supratentorial small vessel disease, stable. Prior lacunar infarcts noted in the right basal ganglia and left cerebellum. No acute infarct. No hemorrhage, mass, or extra-axial fluid. Foci of arterial  vascular calcification noted. Opacification in multiple ethmoid air cells. CT cervical spine: No fracture or spondylolisthesis. There is multilevel osteoarthritic change. There is a stent in the right carotid artery. Electronically Signed   By: Lowella Grip III M.D.   On: 03/31/2017 14:21       Subjective:  No new complaints.  Discharge Exam: Vitals:   04/04/17 0836 04/04/17 1351  BP: (!) 176/94 133/78  Pulse:  68  Resp:  20  Temp:  98.5 F (36.9 C)   Vitals:   04/04/17 0603 04/04/17 0634 04/04/17 0836 04/04/17 1351  BP: (!) 183/87 (!) 168/91 (!) 176/94 133/78  Pulse: 61 (!) 58  68  Resp: 18   20  Temp: 97.8 F (36.6 C)   98.5 F (36.9 C)  TempSrc: Oral   Oral  SpO2: 96%   100%  Weight:      Height:        General: Pt is alert, awake, not in acute distress Cardiovascular: RRR, S1/S2 +, no rubs, no gallops Respiratory: CTA bilaterally, no wheezing, no rhonchi Abdominal: Soft, NT, ND, bowel sounds + Extremities: no edema, no cyanosis    The results of significant diagnostics from this hospitalization (including imaging, microbiology, ancillary and laboratory) are listed below for reference.     Microbiology: Recent Results (from the past 240 hour(s))  Urine culture     Status: Abnormal   Collection Time: 03/31/17 12:31 PM  Result Value Ref Range Status   Specimen Description URINE, RANDOM  Final   Special Requests NONE  Final   Culture (A)  Final    <10,000 COLONIES/mL INSIGNIFICANT GROWTH Performed at [facility], [place] [address], [place], [place] [address]  Report Status 04/02/2017 FINAL  Final     Labs: BNP (last 3 results) No results for input(s): BNP in the last 8760 hours. Basic Metabolic Panel:  Recent Labs Lab 03/31/17 1242 04/02/17 1159  NA 136 138  K 3.8 3.6  CL 104 108  CO2 25 24  GLUCOSE 100* 124*  BUN 16 11  CREATININE 0.79 0.84  CALCIUM 8.4* 7.7*   Liver Function Tests:  Recent Labs Lab 03/31/17 1242  AST 20   ALT 12*  ALKPHOS 40  BILITOT 0.7  PROT 6.6  ALBUMIN 2.5*   No results for input(s): LIPASE, AMYLASE in the last 168 hours. No results for input(s): AMMONIA in the last 168 hours. CBC:  Recent Labs Lab 03/31/17 1242 04/02/17 1159 04/04/17 1210  WBC 4.5 4.6 6.3  NEUTROABS 3.0  --   --   HGB 9.3* 9.1* 9.9*  HCT 26.3* 26.2* 28.3*  MCV 85.7 84.0 83.7  PLT 136* 122* 134*   Cardiac Enzymes:  Recent Labs Lab 03/31/17 1242  TROPONINI <0.03   BNP: Invalid input(s): POCBNP CBG: No results for input(s): GLUCAP in the last 168 hours. D-Dimer No results for input(s): DDIMER in the last 72 hours. Hgb A1c No results for input(s): HGBA1C in the last 72 hours. Lipid Profile No results for input(s): CHOL, HDL, LDLCALC, TRIG, CHOLHDL, LDLDIRECT in the last 72 hours. Thyroid function studies No results for input(s): TSH, T4TOTAL, T3FREE, THYROIDAB in the last 72 hours.  Invalid input(s): FREET3 Anemia work up No results for input(s): VITAMINB12, FOLATE, FERRITIN, TIBC, IRON, RETICCTPCT in the last 72 hours. Urinalysis    Component Value Date/Time   COLORURINE YELLOW 12/06/2016 1130   APPEARANCEUR CLEAR 12/06/2016 1130   APPEARANCEUR Hazy 02/02/2014 1501   LABSPEC 1.025 12/06/2016 1130   LABSPEC 1.023 02/02/2014 1501   PHURINE 5.0 12/06/2016 1130   GLUCOSEU NEGATIVE 12/06/2016 1130   GLUCOSEU Negative 02/02/2014 1501   HGBUR SMALL (A) 12/06/2016 1130   BILIRUBINUR NEGATIVE 12/06/2016 1130   BILIRUBINUR Negative 02/02/2014 1501   KETONESUR NEGATIVE 12/06/2016 1130   PROTEINUR NEGATIVE 12/06/2016 1130   NITRITE NEGATIVE 12/06/2016 1130   LEUKOCYTESUR NEGATIVE 12/06/2016 1130   LEUKOCYTESUR Negative 02/02/2014 1501   Sepsis Labs Invalid input(s): PROCALCITONIN,  WBC,  LACTICIDVEN Microbiology Recent Results (from the past 240 hour(s))  Urine culture     Status: Abnormal   Collection Time: 03/31/17 12:31 PM  Result Value Ref Range Status   Specimen Description URINE,  RANDOM  Final   Special Requests NONE  Final   Culture (A)  Final    <10,000 COLONIES/mL INSIGNIFICANT GROWTH Performed at [facility], [place] [address], [place], [place] [address]    Report Status 04/02/2017 FINAL  Final     Time coordinating discharge: Over 30 minutes  SIGNED:   Hosie Poisson, MD  [facility] 04/04/2017, 2:41 PM Pager   If 7PM-7AM, please contact night-coverage [email or web address] Password [email or web address]

## 2017-04-04 NOTE — Progress Notes (Signed)
Occupational Therapy Treatment Patient Details Name: Kenneth Vazquez MRN: [id number] DOB: 11/27/1940 Today's Date: 04/04/2017    History of present illness Pt is a 76 y/o male s/p Lt MCA CVA. PMH includes CVA x 2 in 2105, seizures, HTN, pacemaker, dementia, and carotid artery disease    OT comments  Pt progressing towards goals this session with improved response to commands during functional transfer and peri care. Pt's wife present this time and she was able to clarify PTA independence. Pt with continued flat affect, but still enjoys sweets. Next session to establish HEP for LUE. And to focus on caregiver education. OT will continue to follow.   Follow Up Recommendations  SNF;Supervision/Assistance - 24 hour    Equipment Recommendations  Other (comment) (defer to next venue)    Recommendations for Other Services      Precautions / Restrictions Precautions Precautions: Fall Restrictions Weight Bearing Restrictions: No       Mobility Bed Mobility Overal bed mobility: Needs Assistance Bed Mobility: Supine to Sit     Supine to sit: Mod assist     General bed mobility comments: vc for walking legs to EOB, with stabilization of BLE, pt able to lift and scoot hips over to the EOB supervision level, assist to elevate trunks. max cues throughout  Transfers Overall transfer level: Needs assistance Equipment used: Rolling walker (2 wheeled);2 person hand held assist Transfers: Sit to/from [facility] Sit to Stand: +2 physical assistance;Mod assist Stand pivot transfers: +2 physical assistance;Mod assist       General transfer comment: Max +2 for rise. Pt with posterior lean and max VCs given for hand placement and sequencing     Balance Overall balance assessment: Needs assistance Sitting-balance support: Bilateral upper extremity supported;Feet supported Sitting balance-Leahy Scale: Poor Sitting balance - Comments: initially needed mod A to maintain sitting  and was able to progress to min guard Postural control: Posterior lean Standing balance support: Bilateral upper extremity supported;During functional activity Standing balance-Leahy Scale: Zero                             ADL either performed or assessed with clinical judgement   ADL Overall ADL's : Needs assistance/impaired     Grooming: Wash/dry hands;Min guard;Sitting                   Toilet Transfer: Maximal assistance;+2 for physical assistance;+2 for safety/equipment Toilet Transfer Details (indicate cue type and reason): simulated through recliner Toileting- Clothing Manipulation and Hygiene: Total assistance;Sit to/from stand Toileting - Clothing Manipulation Details (indicate cue type and reason): for rear peri care       General ADL Comments: Pt fed himself at baseline and was only min A for ADL per wife who is present this session     Vision   Vision Assessment?: Vision impaired- to be further tested in functional context   Perception     Praxis      Cognition Arousal/Alertness: Awake/alert Behavior During Therapy: Flat affect Overall Cognitive Status: Impaired/Different from baseline Area of Impairment: Attention;Following commands;Safety/judgement;Awareness;Problem solving                   Current Attention Level: Sustained   Following Commands: Follows one step commands inconsistently;Follows one step commands with increased time (command following improving) Safety/Judgement: Decreased awareness of safety;Decreased awareness of deficits Awareness: Emergent Problem Solving: Slow processing;Difficulty sequencing;Requires verbal cues;Requires tactile cues General Comments: imoproved initiation and ability  to follow directions (still deficts though)        Exercises     Shoulder Instructions       General Comments Pt's wife present during session    Pertinent Vitals/ Pain       Pain Assessment: No/denies pain  Home  Living                                          Prior Functioning/Environment              Frequency  Min 2X/week        Progress Toward Goals  OT Goals(current goals can now be found in the care plan section)  Progress towards OT goals: Progressing toward goals  Acute Rehab OT Goals Patient Stated Goal: none stated this session; wife would like to see him get back to walking OT Goal Formulation: With family Time For Goal Achievement: 04/16/17 Potential to Achieve Goals: Good  Plan Discharge plan needs to be updated;Frequency needs to be updated (Insureance would not pay for CIR - change to SNF)    Co-evaluation    PT/OT/SLP Co-Evaluation/Treatment: Yes Reason for Co-Treatment: Necessary to address cognition/behavior during functional activity;For patient/therapist safety;To address functional/ADL transfers   OT goals addressed during session: ADL's and self-care      AM-PAC PT "6 Clicks" Daily Activity     Outcome Measure   Help from another person eating meals?: A Lot Help from another person taking care of personal grooming?: A Lot Help from another person toileting, which includes using toliet, bedpan, or urinal?: A Lot Help from another person bathing (including washing, rinsing, drying)?: A Lot Help from another person to put on and taking off regular upper body clothing?: A Lot Help from another person to put on and taking off regular lower body clothing?: A Lot 6 Click Score: 12    End of Session Equipment Utilized During Treatment: Gait belt;Rolling walker  OT Visit Diagnosis: Unsteadiness on feet (R26.81);Other abnormalities of gait and mobility (R26.89);Muscle weakness (generalized) (M62.81);Other symptoms and signs involving cognitive function;Cognitive communication deficit (R41.841);Hemiplegia and hemiparesis Symptoms and signs involving cognitive functions: Other cerebrovascular disease Hemiplegia - Right/Left: Right Hemiplegia -  dominant/non-dominant: Non-Dominant Hemiplegia - caused by: Other cerebrovascular disease   Activity Tolerance Patient tolerated treatment well   Patient Left in chair;with call bell/phone within reach;with chair alarm set;with family/visitor present   Nurse Communication Mobility status        Time: 1354-1420 OT Time Calculation (min): 26 min  Charges: OT General Charges $OT Visit: 1 Procedure OT Treatments $Self Care/Home Management : 8-22 mins  Hulda Humphrey OTR/L [place] 04/04/2017, 2:50 PM

## 2017-04-04 NOTE — Progress Notes (Signed)
Inpatient Rehabilitation  I have received an insurance denial for IP Rehab due to concerns for therapy tolerance.  Have notified patient, spouse, RNCM, CSW, and MD.  Wife ok with patient going to a less intense therapy setting.  Plan to sign off at this time.  Please call if questions.   Carmelia Roller., CCC/SLP Admission Coordinator  [place]  Cell [phone]

## 2017-04-04 NOTE — Progress Notes (Signed)
Physical Therapy Treatment Patient Details Name: Kenneth Vazquez MRN: [id number] DOB: 05-12-1941 Today's Date: 04/04/2017    History of Present Illness Pt is a 76 y/o male s/p Lt MCA CVA. PMH includes CVA x 2 in 2105, seizures, HTN, pacemaker, dementia, and carotid artery disease     PT Comments    Pt making slow progress, limited ability to follow direction or problem solving sequencing.  But does participate throughout the session, frequently trying to second guess what the next task is and move on.    Follow Up Recommendations  SNF     Equipment Recommendations  None recommended by PT    Recommendations for Other Services       Precautions / Restrictions Precautions Precautions: Fall Restrictions Weight Bearing Restrictions: No    Mobility  Bed Mobility Overal bed mobility: Needs Assistance Bed Mobility: Supine to Sit     Supine to sit: Mod assist     General bed mobility comments: vc for walking legs to EOB, with stabilization of BLE, pt able to lift and scoot hips over to the EOB supervision level, assist to elevate trunks. max cues throughout  Transfers Overall transfer level: Needs assistance Equipment used: Rolling walker (2 wheeled);2 person hand held assist Transfers: Sit to/from [facility] Sit to Stand: +2 physical assistance;Mod assist Stand pivot transfers: +2 physical assistance;Mod assist       General transfer comment: Max +2 for rise. Pt with posterior lean and max VCs given for hand placement and sequencing   Ambulation/Gait             General Gait Details: pivot to chair only.  Pt needing max cuing for hand placement and use of the RW.    Stairs            Wheelchair Mobility    Modified Rankin (Stroke Patients Only)       Balance Overall balance assessment: Needs assistance Sitting-balance support: Bilateral upper extremity supported;Feet supported Sitting balance-Leahy Scale: Poor Sitting balance -  Comments: initially needed mod A to maintain sitting and was able to progress to min guard Postural control: Posterior lean Standing balance support: Bilateral upper extremity supported;During functional activity Standing balance-Leahy Scale: Zero Standing balance comment: heavy cuing for hand placement and safety.  Pt pulling forward with UE's on RW and unable to stand fully erect.                            Cognition Arousal/Alertness: Awake/alert Behavior During Therapy: Flat affect Overall Cognitive Status: Impaired/Different from baseline Area of Impairment: Attention;Following commands;Safety/judgement;Awareness;Problem solving                   Current Attention Level: Sustained   Following Commands: Follows one step commands inconsistently;Follows one step commands with increased time (command following improving) Safety/Judgement: Decreased awareness of safety;Decreased awareness of deficits Awareness: Emergent Problem Solving: Slow processing;Difficulty sequencing;Requires verbal cues;Requires tactile cues General Comments: imoproved initiation and ability to follow directions (still deficts though)      Exercises      General Comments General comments (skin integrity, edema, etc.): Pt's wife present during session      Pertinent Vitals/Pain Pain Assessment: No/denies pain    Home Living                      Prior Function            PT Goals (current goals  can now be found in the care plan section) Acute Rehab PT Goals Patient Stated Goal: none stated this session; wife would like to see him get back to walking PT Goal Formulation: Patient unable to participate in goal setting Progress towards PT goals: Progressing toward goals    Frequency    Min 3X/week      PT Plan Discharge plan needs to be updated    Co-evaluation PT/OT/SLP Co-Evaluation/Treatment: Yes Reason for Co-Treatment: Complexity of the patient's impairments  (multi-system involvement) PT goals addressed during session: Mobility/safety with mobility OT goals addressed during session: ADL's and self-care      AM-PAC PT "6 Clicks" Daily Activity  Outcome Measure  Difficulty turning over in bed (including adjusting bedclothes, sheets and blankets)?: Total Difficulty moving from lying on back to sitting on the side of the bed? : Total Difficulty sitting down on and standing up from a chair with arms (e.g., wheelchair, bedside commode, etc,.)?: Total Help needed moving to and from a bed to chair (including a wheelchair)?: A Lot Help needed walking in hospital room?: Total Help needed climbing 3-5 steps with a railing? : Total 6 Click Score: 7    End of Session Equipment Utilized During Treatment: Gait belt Activity Tolerance: Patient limited by lethargy Patient left: in chair;with call bell/phone within reach;with chair alarm set;with family/visitor present Nurse Communication: Mobility status PT Visit Diagnosis: Unsteadiness on feet (R26.81);Other abnormalities of gait and mobility (R26.89);Muscle weakness (generalized) (M62.81);Other symptoms and signs involving the nervous system (R29.898);Hemiplegia and hemiparesis Hemiplegia - Right/Left: Right Hemiplegia - dominant/non-dominant: Dominant Hemiplegia - caused by: Cerebral infarction     Time: 1348-1420 PT Time Calculation (min) (ACUTE ONLY): 32 min  Charges:  $Therapeutic Activity: 8-22 mins                    G Codes:       April 14, 2017  Donnella Sham, PT [phone]  (pager)   [name] 2017/04/14, 3:00 PM

## 2017-04-05 NOTE — Progress Notes (Signed)
PROGRESS NOTE    Kenneth Vazquez  WCH:[id number] DOB: 07/26/1941 DOA: 03/31/2017 PCP: Jodi Marble, MD    Brief Narrative: Kenneth Vazquez is a 76 y.o. male with a history of seizure disorder, stroke with residual right-sided weakness who was in his normal state of health until last Friday when his right side became weaker, and had a fall prior to admission. He was admitted for evaluation of stroke.   Assessment & Plan:   Principal Problem:   CVA (cerebral vascular accident) ([place]) Active Problems:   Seizures ([place])   Essential hypertension   Dementia   Severe protein-calorie malnutrition (HCC)   Normocytic anemia   Hyperlipidemia   Thrombocytopenia (HCC)   Malnutrition of moderate degree  Right sided weakness sec to CVA  :   Initial CT head without contrast does not show any acute stroke. CT head and neck shows  Previous of right carotid stent. Minimal diameter in the proximal ICA region within the stent is 2.5 mm, consistent with a 50% stenosis.  MRI and MRA couldn't be done due to PPM.  LDL is 57, HDL is 33.  hgba1c is 5 Echocardiogram shows left ventricular EF of 45% with grade 1 diastolic dysfunction.  PPM interrogation revealed Atrial tachycardia, no flutter and fib.  Neurology consulted and recommendations given.  Therapy evals recommending inpatient rehab.  Inpatient rehab consulted,insurance didn't approve. Plan for SNF.    Seizures:  None since admission.  Resume keppra.    Normocytic anemia and mild thrombocytopenia:  Baseline hemoglobin between 11 to 10.  Monitor.    Dementia; Resume aricept. No agitation.    Hypertension:  Stable.  Permissive hypertension.   Hyperlipidemia:  LDL is 59 resume statin on discharge.   Protein energy malnutrition:  Nutrition consulted for recommendations.    DVT prophylaxis: (Lovenox) Code Status: DNR) Family Communication: none at bedside.  Disposition Plan:snf in am.   Consultants:   Neurology.    Cardiology.    Procedures: CT angio of the head and neck.   Echocardiogram.    Antimicrobials: none.    Subjective: No new complaiants. No chest pain or sob.  Objective: Vitals:   04/04/17 2128 04/05/17 0536 04/05/17 0853 04/05/17 1216  BP: (!) 144/87 (!) 148/73 (!) 145/80 118/65  Pulse: 81 (!) 56 65 66  Resp:  16 16 16   Temp: 98.3 F (36.8 C) 98.8 F (37.1 C) 98.6 F (37 C) 98.2 F (36.8 C)  TempSrc: Oral  Oral   SpO2: 99% 100% 98% 98%  Weight:      Height:        Intake/Output Summary (Last 24 hours) at 04/05/17 1846 Last data filed at 04/05/17 1046  Gross per 24 hour  Intake              150 ml  Output              700 ml  Net             -550 ml   Filed Weights   03/31/17 1156 04/01/17 1030  Weight: 72.6 kg (160 lb) 72.6 kg (160 lb)    Examination: stable.   General exam: Appears calm and comfortable. No new complaints.  Respiratory system: Clear to auscultation. Respiratory effort normal. No wheeizng or rhonchi.  Cardiovascular system: S1 & S2 heard, RRR. No JVD, murmurs, rubs, gallops or clicks. No pedal edema. Gastrointestinal system: Abdomen is soft NT nd bs+ Central nervous system: Alert and  Confused . Right  sided hemiparesis. Dysarthria. Extremities: Symmetric 5 x 5 power. Skin: No rashes, lesions or ulcers Psychiatry: . Mood & affect appropriate.     Data Reviewed: I have personally reviewed following labs and imaging studies  CBC:  Recent Labs Lab 03/31/17 1242 04/02/17 1159 04/04/17 1210  WBC 4.5 4.6 6.3  NEUTROABS 3.0  --   --   HGB 9.3* 9.1* 9.9*  HCT 26.3* 26.2* 28.3*  MCV 85.7 84.0 83.7  PLT 136* 122* 010*   Basic Metabolic Panel:  Recent Labs Lab 03/31/17 1242 04/02/17 1159  NA 136 138  K 3.8 3.6  CL 104 108  CO2 25 24  GLUCOSE 100* 124*  BUN 16 11  CREATININE 0.79 0.84  CALCIUM 8.4* 7.7*   GFR: Estimated Creatinine Clearance: 75.6 mL/min (by C-G formula based on SCr of 0.84 mg/dL). Liver Function  Tests:  Recent Labs Lab 03/31/17 1242  AST 20  ALT 12*  ALKPHOS 40  BILITOT 0.7  PROT 6.6  ALBUMIN 2.5*   No results for input(s): LIPASE, AMYLASE in the last 168 hours. No results for input(s): AMMONIA in the last 168 hours. Coagulation Profile:  Recent Labs Lab 03/31/17 1242  INR 1.15   Cardiac Enzymes:  Recent Labs Lab 03/31/17 1242  TROPONINI <0.03   BNP (last 3 results) No results for input(s): PROBNP in the last 8760 hours. HbA1C: No results for input(s): HGBA1C in the last 72 hours. CBG: No results for input(s): GLUCAP in the last 168 hours. Lipid Profile: No results for input(s): CHOL, HDL, LDLCALC, TRIG, CHOLHDL, LDLDIRECT in the last 72 hours. Thyroid Function Tests: No results for input(s): TSH, T4TOTAL, FREET4, T3FREE, THYROIDAB in the last 72 hours. Anemia Panel: No results for input(s): VITAMINB12, FOLATE, FERRITIN, TIBC, IRON, RETICCTPCT in the last 72 hours. Sepsis Labs: No results for input(s): PROCALCITON, LATICACIDVEN in the last 168 hours.  Recent Results (from the past 240 hour(s))  Urine culture     Status: Abnormal   Collection Time: 03/31/17 12:31 PM  Result Value Ref Range Status   Specimen Description URINE, RANDOM  Final   Special Requests NONE  Final   Culture (A)  Final    <10,000 COLONIES/mL INSIGNIFICANT GROWTH Performed at [facility], [address], [place], [place] [address]    Report Status 04/02/2017 FINAL  Final         Radiology Studies: No results found.      Scheduled Meds: . amLODipine  2.5 mg Oral Daily  . aspirin  325 mg Oral Daily  . atorvastatin  20 mg Oral q1800  . donepezil  10 mg Oral QHS  . feeding supplement (ENSURE ENLIVE)  237 mL Oral BID BM  . levETIRAcetam  750 mg Oral BID   Continuous Infusions:    LOS: 5 days    Time spent: 20 min    Shanara Schnieders, MD [facility] Pager [phone] If 7PM-7AM, please contact night-coverage [email or web address] Password  [facility] 04/05/2017, 6:46 PM

## 2017-04-06 NOTE — Plan of Care (Signed)
Problem: Education: Goal: Knowledge of [facility] Education information/materials will improve Outcome: Progressing Pt's knowledge of stroke will be improved prior to discharge.

## 2017-04-06 NOTE — Progress Notes (Signed)
PROGRESS NOTE    Kenneth Vazquez  EUM:[id number] DOB: [birth year] DOA: 03/31/2017 PCP: Jodi Marble, MD    Brief Narrative: Kenneth Vazquez is a 76 y.o. male with a history of seizure disorder, stroke with residual right-sided weakness who was in his normal state of health until last Friday when his right side became weaker, and had a fall prior to admission. He was admitted for evaluation of stroke.   Stroke work up done and waiting for SNF discharge when bed available.   Assessment & Plan:   Principal Problem:   CVA (cerebral vascular accident) ([place]) Active Problems:   Seizures ([place])   Essential hypertension   Dementia   Severe protein-calorie malnutrition (HCC)   Normocytic anemia   Hyperlipidemia   Thrombocytopenia (HCC)   Malnutrition of moderate degree  Right sided weakness sec to CVA  :   Initial CT head without contrast does not show any acute stroke. CT head and neck shows  Previous of right carotid stent. Minimal diameter in the proximal ICA region within the stent is 2.5 mm, consistent with a 50% stenosis.  MRI and MRA couldn't be done due to PPM.  LDL is 57, HDL is 33.  hgba1c is 5 Echocardiogram shows left ventricular EF of 45% with grade 1 diastolic dysfunction.  PPM interrogation revealed Atrial tachycardia, no flutter and fib.  Neurology consulted and recommendations given.  Therapy evals recommending inpatient rehab.  Inpatient rehab consulted,insurance didn't approve. Plan for SNF.   no changes in meds.   Seizures:  None since admission.  Resume keppra.    Normocytic anemia and mild thrombocytopenia:  Baseline hemoglobin between 11 to 10.  Monitor.    Dementia; Resume aricept. No agitation.    Hypertension:  Stable.  Permissive hypertension.   Hyperlipidemia:  LDL is 59 resume statin on discharge.   Protein energy malnutrition:  Nutrition consulted for recommendations.    DVT prophylaxis: (Lovenox) Code Status: DNR) Family  Communication: wife  at bedside.  Disposition Plan:snf in am.   Consultants:   Neurology.   Cardiology.    Procedures: CT angio of the head and neck.   Echocardiogram.    Antimicrobials: none.    Subjective: No new complaiants. No chest pain or sob.  No changes.  Objective: Vitals:   04/06/17 0001 04/06/17 0355 04/06/17 0917 04/06/17 1710  BP: (!) 146/85 (!) 157/79 (!) 127/91 111/64  Pulse: (!) 59 60  65  Resp: 18 18  14   Temp: 97.8 F (36.6 C) 98 F (36.7 C)  98.2 F (36.8 C)  TempSrc: Oral Oral  Oral  SpO2: 97% 96%  99%  Weight:      Height:        Intake/Output Summary (Last 24 hours) at 04/06/17 1943 Last data filed at 04/06/17 0500  Gross per 24 hour  Intake                0 ml  Output              800 ml  Net             -800 ml   Filed Weights   03/31/17 1156 04/01/17 1030  Weight: 72.6 kg (160 lb) 72.6 kg (160 lb)    Examination: stable.  No changes in exam from yesterday  General exam: Appears calm and comfortable. No new complaints.  Respiratory system: Clear to auscultation. Respiratory effort normal. No wheeizng or rhonchi.  Cardiovascular system: S1 & S2 heard,  RRR. No JVD, murmurs, rubs, gallops or clicks. No pedal edema. Gastrointestinal system: Abdomen is soft NT nd bs+ Central nervous system: Alert and  Confused . Right sided hemiparesis. Dysarthria. Extremities: Symmetric 5 x 5 power. Skin: No rashes, lesions or ulcers Psychiatry: . Mood & affect appropriate.     Data Reviewed: I have personally reviewed following labs and imaging studies  CBC:  Recent Labs Lab 03/31/17 1242 04/02/17 1159 04/04/17 1210  WBC 4.5 4.6 6.3  NEUTROABS 3.0  --   --   HGB 9.3* 9.1* 9.9*  HCT 26.3* 26.2* 28.3*  MCV 85.7 84.0 83.7  PLT 136* 122* 696*   Basic Metabolic Panel:  Recent Labs Lab 03/31/17 1242 04/02/17 1159  NA 136 138  K 3.8 3.6  CL 104 108  CO2 25 24  GLUCOSE 100* 124*  BUN 16 11  CREATININE 0.79 0.84  CALCIUM 8.4* 7.7*     GFR: Estimated Creatinine Clearance: 75.6 mL/min (by C-G formula based on SCr of 0.84 mg/dL). Liver Function Tests:  Recent Labs Lab 03/31/17 1242  AST 20  ALT 12*  ALKPHOS 40  BILITOT 0.7  PROT 6.6  ALBUMIN 2.5*   No results for input(s): LIPASE, AMYLASE in the last 168 hours. No results for input(s): AMMONIA in the last 168 hours. Coagulation Profile:  Recent Labs Lab 03/31/17 1242  INR 1.15   Cardiac Enzymes:  Recent Labs Lab 03/31/17 1242  TROPONINI <0.03   BNP (last 3 results) No results for input(s): PROBNP in the last 8760 hours. HbA1C: No results for input(s): HGBA1C in the last 72 hours. CBG: No results for input(s): GLUCAP in the last 168 hours. Lipid Profile: No results for input(s): CHOL, HDL, LDLCALC, TRIG, CHOLHDL, LDLDIRECT in the last 72 hours. Thyroid Function Tests: No results for input(s): TSH, T4TOTAL, FREET4, T3FREE, THYROIDAB in the last 72 hours. Anemia Panel: No results for input(s): VITAMINB12, FOLATE, FERRITIN, TIBC, IRON, RETICCTPCT in the last 72 hours. Sepsis Labs: No results for input(s): PROCALCITON, LATICACIDVEN in the last 168 hours.  Recent Results (from the past 240 hour(s))  Urine culture     Status: Abnormal   Collection Time: 03/31/17 12:31 PM  Result Value Ref Range Status   Specimen Description URINE, RANDOM  Final   Special Requests NONE  Final   Culture (A)  Final    <10,000 COLONIES/mL INSIGNIFICANT GROWTH Performed at [facility], [address] [place], [place] [address]    Report Status 04/02/2017 FINAL  Final         Radiology Studies: No results found.      Scheduled Meds: . amLODipine  2.5 mg Oral Daily  . aspirin  325 mg Oral Daily  . atorvastatin  20 mg Oral q1800  . donepezil  10 mg Oral QHS  . feeding supplement (ENSURE ENLIVE)  237 mL Oral BID BM  . levETIRAcetam  750 mg Oral BID   Continuous Infusions:    LOS: 6 days    Time spent: 20 min    Jennet Scroggin, MD [facility] Pager [phone] If 7PM-7AM, please contact night-coverage [email or web address] Password [facility] 04/06/2017, 7:43 PM

## 2017-04-06 NOTE — Clinical Social Work Note (Signed)
CSW notified pt is ready to DC to SNF. Pt was denied CIR from ins [facility]. Pt has several accepting SNF's. Family (Spouse/Dtr) contacted to select SNF, no answer VM left with CSW's contact information.  CSW will continue to follow and coordinate DC plan to SNF.  Kenneth Vazquez B. Joline Maxcy Clinical Social Work Dept Weekend Social Worker [phone] 10:41 AM

## 2017-04-06 NOTE — Clinical Social Work Note (Signed)
Clinical Social Work Assessment  Patient Details  Name: Kenneth Vazquez MRN: [id number] Date of Birth: 1941/03/31  Date of referral:  04/06/17               Reason for consult:  Facility Placement                Permission sought to share information with:  Case Manager, Family Supports Permission granted to share information::  Yes, Verbal Permission Granted  Name::        Agency::     Relationship::     Contact Information:     Housing/Transportation Living arrangements for the past 2 months:  Single Family Home (3 ext stairs, 1 int stair) Source of Information:  Spouse Patient Interpreter Needed:  None Criminal Activity/Legal Involvement Pertinent to Current Situation/Hospitalization:  No - Comment as needed Significant Relationships:  Spouse Lives with:  Spouse Do you feel safe going back to the place where you live?  Yes Need for family participation in patient care:  Yes (Comment)  Care giving concerns:  Pt lives with spouse who is unable to care for pt alone. Spouse agreeable with SNF placement so that pt can get stronger.   Social Worker assessment / plan:  CSW met with pt/spouse @ bedside. Demographics verified. Pt lives with spouse in a [facility]; 3 ext stairs, 1 int stair. Prior to hospitalization pt ambulated with a cane/walker and was receiving [place] for 2 weeks. Pt was independent with ADL's. Spouse is main support. Wife unders sub acute is best for pt at this time. CSW will continue to follow and coordinate transfer to SNF.  Employment status:  Retired Forensic scientist:  Medicare PT Recommendations:  Jeff / Referral to community resources:  Echo  Patient/Family's Response to care:  Spouse agreeable to SNF for pt to get back to baseline. Spouse confident pt will improve with therapy.  Patient/Family's Understanding of and Emotional Response to Diagnosis, Current Treatment, and Prognosis:  Spouse understands sub acute  will be best for pt to get back to baseline and agrees acute rehab was not favorable for pt at this time.  Emotional Assessment Appearance:  Appears stated age Attitude/Demeanor/Rapport:  Unable to Assess Affect (typically observed):  Unable to Assess Orientation:  Oriented to Self Alcohol / Substance use:  Not Applicable Psych involvement (Current and /or in the community):  No (Comment)  Discharge Needs  Concerns to be addressed:    Readmission within the last 30 days:  No Current discharge risk:  Dependent with Mobility Barriers to Discharge:  Continued Medical Work up   [place], [place], [place] 04/06/2017, 5:27 PM

## 2017-04-06 NOTE — Clinical Social Work Note (Addendum)
CSW met with pt's spouse @ bedside. SNF choice is [facility], [facility].   CSW faxed referral; information to [facility] [phone] for auth, they are not open on weekends, auth will not be received until Monday. BSRN notified. Handoff left for weekday SW to FU.  CSW will continue to follow and coordinate transfer to SNF.  Trellis Vanoverbeke B. Joline Maxcy Clinical Social Work Dept Weekend Social Worker [phone] 12:27 PM

## 2017-04-07 MED ORDER — ASPIRIN 325 MG PO TABS: 325.0000 mg | ORAL_TABLET | Freq: Every day | ORAL | Status: DC

## 2017-04-07 MED ORDER — ENSURE ENLIVE PO LIQD: 237.0000 mL | Freq: Two times a day (BID) | ORAL | 12 refills | Status: AC

## 2017-04-07 MED ORDER — ATORVASTATIN CALCIUM 20 MG PO TABS: 20.0000 mg | ORAL_TABLET | Freq: Every day | ORAL | Status: DC

## 2017-04-07 MED ORDER — AMLODIPINE BESYLATE 2.5 MG PO TABS: 2.5000 mg | ORAL_TABLET | Freq: Every day | ORAL | 1 refills | Status: DC

## 2017-04-07 NOTE — Progress Notes (Signed)
Report given to Nurse Dionne Milo at [facility], pt ready for discharge.

## 2017-04-07 NOTE — Discharge Summary (Addendum)
Physician Discharge Summary  Kenneth Vazquez:[id number] DOB: 08-07-1941 DOA: 03/31/2017  PCP: Jodi Marble, MD  Admit date: 03/31/2017 Discharge date: 04/07/2017   Admitted From: Home.  Disposition: SNF   Recommendations for Outpatient Follow-up:  1. Follow up with PCP in 1-2 weeks 2. Please obtain BMP/CBC in one week Please follow up  With neurology as recommended.  Please follow up with cardiology in 2 weeks .     Discharge Condition:STABLE.  CODE STATUS: DNR Diet recommendation: Heart Healthy   Brief/Interim Summary: Kenneth Vazquez a 76 y.o.malewith a history of seizure disorder, stroke with residual right-sided weakness who was in his normal state of health until last Friday when his right side became weaker, and had a fall prior to admission. He was admitted for evaluation of stroke. NEUROLOGY consulted and recommendations given.  Stroke work up done and waiting for SNF discharge when bed available.    Discharge Diagnoses:  Principal Problem:   CVA (cerebral vascular accident) ([place]) Active Problems:   Seizures ([place])   Essential hypertension   Dementia Moderate. protein-calorie malnutrition (HCC)   Normocytic anemia   Hyperlipidemia   Thrombocytopenia (H[facility])   Malnutrition of moderate degree  Right sided weakness sec to CVA  :   Initial CT head without contrast does not show any acute stroke. CT head and neck shows  Previous of right carotid stent. Minimal diameter in the proximal ICA region within the stent is 2.5 mm, consistent with a 50% stenosis.  MRI and MRA couldn't be done due to PPM.  LDL is 57, HDL is 33.  hgba1c is 5 Echocardiogram shows left ventricular EF of 45% with grade 1 diastolic dysfunction.  PPM interrogation revealed Atrial tachycardia, no flutter and fib.  Neurology consulted and recommendations given.  Therapy evals recommending inpatient rehab.  Inpatient rehab consulted,insurance didn't approve. Plan for SNF.   no  changes in meds.   Seizures:  None since admission.  Resume keppra.    Normocytic anemia and mild thrombocytopenia:  Baseline hemoglobin between 11 to 10.  Monitor.    Dementia; Resume aricept. No agitation.    Hypertension:  Stable.  Permissive hypertension.  Resume amlodipine on discharge.   Hyperlipidemia:  LDL is 59 resume statin on discharge.   Protein energy malnutrition:  Moderate protein energy malnutrition from chronic illness.  Nutrition consulted for recommendations.    Discharge Instructions  Discharge Instructions    Ambulatory referral to Neurology    Complete by:  As directed    Pt will follow up with Dr. Manuella Ghazi at [facility] in about 6 weekes. Thanks.   Diet - low sodium heart healthy    Complete by:  As directed    Discharge instructions    Complete by:  As directed    Please follow up with neurology as recommended.  Please follow up with cardiology in 2 to 4 weeks.  Please follow u pwith PCP in 2 weeks.     Allergies as of 04/07/2017      Reactions   Sulfa Antibiotics Hives      Medication List    STOP taking these medications   aspirin EC 81 MG tablet Replaced by:  aspirin 325 MG tablet     TAKE these medications   amLODipine 2.5 MG tablet Commonly known as:  NORVASC Take 1 tablet (2.5 mg total) by mouth daily.   aspirin 325 MG tablet Take 1 tablet (325 mg total) by mouth daily. Replaces:  aspirin EC 81 MG  tablet   atorvastatin 20 MG tablet Commonly known as:  LIPITOR Take 1 tablet (20 mg total) by mouth daily at 6 PM.   docusate sodium 100 MG capsule Commonly known as:  COLACE Take 100 mg by mouth daily.   donepezil 10 MG tablet Commonly known as:  ARICEPT Take 1 tablet (10 mg total) by mouth every morning.   feeding supplement (ENSURE ENLIVE) Liqd Take 237 mLs by mouth 2 (two) times daily between meals.   furosemide 20 MG tablet Commonly known as:  LASIX Take 20 mg by mouth daily.   IRON 27 PO Take 1  tablet by mouth daily.   levETIRAcetam 500 MG tablet Commonly known as:  KEPPRA Take 500 mg by mouth 2 (two) times daily.   MYRBETRIQ 25 MG Tb24 tablet Generic drug:  mirabegron ER Take 1 tablet by mouth daily.   traZODone 50 MG tablet Commonly known as:  DESYREL Take 50-100 mg by mouth at bedtime.      Follow-up Information    Vladimir Crofts, MD. Schedule an appointment as soon as possible for a visit in 6 week(s).   Specialty:  Neurology Contact information: [place] [facility] [place] [address] [phone]        [place] Office. Schedule an appointment as soon as possible for a visit in 4 week(s).   Specialty:  Cardiology Contact information: [address], [place] [phone]       Jodi Marble, MD. Schedule an appointment as soon as possible for a visit in 2 week(s).   Specialty:  Internal Medicine Why:  post hospitalization follow up  visit.  Contact information: [place] [address] [phone]          Allergies  Allergen Reactions  . Sulfa Antibiotics Hives    Consultations:  Neurology  Cardiology/    Procedures/Studies: Ct Angio Head W Or Wo Contrast  Result Date: 04/01/2017 CLINICAL DATA:  Slurred speech.  Left-sided weakness. EXAM: CT ANGIOGRAPHY HEAD AND NECK TECHNIQUE: Multidetector CT imaging of the head and neck was performed using the standard protocol during bolus administration of intravenous contrast. Multiplanar CT image reconstructions and MIPs were obtained to evaluate the vascular anatomy. Carotid stenosis measurements (when applicable) are obtained utilizing NASCET criteria, using the distal internal carotid diameter as the denominator. CONTRAST:  50 cc Isovue 370 COMPARISON:  CT 03/31/2017.  MRI 02/09/2014. FINDINGS: CTA NECK FINDINGS Aortic arch: Aortic atherosclerosis. No evidence of dissection. Common  origin of the innominate artery and left common carotid artery. Left vertebral artery arises directly from the arch. Right carotid system: Common carotid artery is tortuous. There is distal atherosclerotic disease. There is a stent extending from the distal common carotid artery through the bifurcation and ICA bulb. Minimal diameter of the stent laminae in the ICA bulb region is measured at 2.5 mm. Expected diameter is 5 mm. Therefore, this indicates a 50% stenosis. External carotid remains patent. No ICA stenosis distal to the bulb. Left carotid system: Common carotid artery is tortuous but widely patent to the bifurcation region. Carotid bifurcation is widely patent. No stenosis or irregularity. Cervical internal carotid artery is tortuous but widely patent. Vertebral arteries: Left vertebral artery arises directly from the arch as noted above. This is a small vessel that is patent through the cervical region to the foramen magnum. Right vertebral artery is also small vessel with normally positioned origin from the subclavian. The origin  is widely patent in the vessel does not show any focal stenosis in the cervical region. Skeleton: Ordinary cervical spondylosis. Other neck: No mass or lymphadenopathy. Upper chest: Mild scarring.  No active process. Review of the MIP images confirms the above findings CTA HEAD FINDINGS Anterior circulation: Both internal carotid arteries are patent through the skullbase an siphon regions. There is atherosclerotic calcification in the carotid siphon regions but no stenosis greater than 30% suspected. There is a patent trigeminal artery on the left with atherosclerotic calcification resulting in stenosis of 50%. See below. The anterior and middle cerebral vessels are patent without proximal stenosis, aneurysm or vascular malformation. Posterior circulation: Small vertebral arteries are patent through the foramen magnum to the basilar. Posterior inferior cerebellar artery show flow  bilaterally. The basilar is a very diminutive vessel. The majority of the distal posterior circulation supply derives from the patent trigeminal artery on the left. The distal basilar artery is widely patent with good flow in the superior cerebellar and posterior cerebral territories. Venous sinuses: Patent and normal. Anatomic variants: Left trigeminal artery as discussed above. Delayed phase: No abnormal enhancement. Review of the MIP images confirms the above findings IMPRESSION: Previous of right carotid stent. Minimal diameter in the proximal ICA region within the stent is 2.5 mm, consistent with a 50% stenosis. Wide patency beyond that. Mild atherosclerotic change at the left carotid bifurcation but no stenosis or significant irregularity. Tortuous internal carotid arteries bilaterally. Aortic atherosclerosis. Atherosclerotic disease in both carotid siphon regions without stenosis greater than 30% on either side. 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 Diminutive vertebral arteries and small proximal basilar artery. Distal basilar supply drives primarily from the left trigeminal artery. No evidence of intracranial branch vessel occlusion. Electronically Signed   By: Nelson Chimes M.D.   On: 04/01/2017 10:15   Dg Chest 1 View  Result Date: 03/31/2017 CLINICAL DATA:  76 year old male post fall 2 days ago. Right-sided weakness and slurred speech. History stroke. Initial encounter. EXAM: CHEST 1 VIEW COMPARISON:  11/27/2016. FINDINGS: Sequential pacemaker enters from the left with leads unchanged in position appearing to be in the region of the right atrium and right ventricle. Cardiomegaly. Left base subsegmental atelectasis. No pulmonary edema or pneumothorax. Calcified slightly tortuous aorta. Bilateral acromioclavicular joint degenerative changes. No obvious rib fracture. IMPRESSION: Cardiomegaly with pacemaker in place. Left base subsegmental  atelectasis. Electronically Signed   By: Genia Del M.D.   On: 03/31/2017 14:23   Ct Head Wo Contrast  Result Date: 03/31/2017 CLINICAL DATA:  Pain following fall. Slurred speech. Left mouth droop. Left-sided weakness EXAM: CT HEAD WITHOUT CONTRAST CT CERVICAL SPINE WITHOUT CONTRAST TECHNIQUE: Multidetector CT imaging of the head and cervical spine was performed following the standard protocol without intravenous contrast. Multiplanar CT image reconstructions of the cervical spine were also generated. COMPARISON:  Head CT November 27, 2016 FINDINGS: CT HEAD FINDINGS Brain: There is mild diffuse atrophy, stable. There is no intracranial mass, hemorrhage, extra-axial fluid collection, or midline shift. There is patchy small vessel disease in the centra semiovale bilaterally. There is a prior lacunar infarct in the right internal capsule just anterior to the genu. There is evidence of a prior small lacunar infarct in the head of the caudate nucleus on the right. There is evidence of a prior small infarct in the superior left cerebellum. There is no new gray-white compartment lesion. No acute infarct evident. Vascular: There is no appreciable hyperdense vessel. There is  calcification in each carotid siphon region. Skull: The bony calvarium appears intact. There is a minimal left superior frontal exostosis, benign and stable. Sinuses/Orbits: There is opacification in multiple ethmoid air cells bilaterally. No intraorbital lesions are identified. Patient appears to have had previous cataract removal on the right, stable. Other: Mastoid air cells are clear. CT CERVICAL SPINE FINDINGS Alignment:  There is no appreciable spondylolisthesis. Skull base and vertebrae: Skull base and craniocervical junction regions appear normal. No evident fracture. No blastic or lytic bone lesions. Soft tissues and spinal canal: Prevertebral soft tissues and predental space regions are normal. No paraspinous lesion. No cord or canal  hematoma. Disc levels: There is moderately severe disc space narrowing at C4-5 and C6-7. There is moderate disc space narrowing at C3-4, C5-6, and C7-T1. There are anterior osteophytes at all levels except for C2. There is facet hypertrophy at multiple levels. There is exit foraminal narrowing with impression on the exiting nerve root on the left at C3-4, on the left at C4-5, and on the left at C6-7. Milder exit foraminal narrowing is noted at C5-6 and C6-7 on the right. No frank disc extrusion or high-grade stenosis. Upper chest: Visualized lung apical regions are clear. Other: There is a stent in the right carotid artery. IMPRESSION: CT head: Stable atrophy with fairly extensive supratentorial small vessel disease, stable. Prior lacunar infarcts noted in the right basal ganglia and left cerebellum. No acute infarct. No hemorrhage, mass, or extra-axial fluid. Foci of arterial vascular calcification noted. Opacification in multiple ethmoid air cells. CT cervical spine: No fracture or spondylolisthesis. There is multilevel osteoarthritic change. There is a stent in the right carotid artery. Electronically Signed   By: Lowella Grip III M.D.   On: 03/31/2017 14:21   Ct Angio Neck W Or Wo Contrast  Result Date: 04/01/2017 CLINICAL DATA:  Slurred speech.  Left-sided weakness. EXAM: CT ANGIOGRAPHY HEAD AND NECK TECHNIQUE: Multidetector CT imaging of the head and neck was performed using the standard protocol during bolus administration of intravenous contrast. Multiplanar CT image reconstructions and MIPs were obtained to evaluate the vascular anatomy. Carotid stenosis measurements (when applicable) are obtained utilizing NASCET criteria, using the distal internal carotid diameter as the denominator. CONTRAST:  50 cc Isovue 370 COMPARISON:  CT 03/31/2017.  MRI 02/09/2014. FINDINGS: CTA NECK FINDINGS Aortic arch: Aortic atherosclerosis. No evidence of dissection. Common origin of the innominate artery and left  common carotid artery. Left vertebral artery arises directly from the arch. Right carotid system: Common carotid artery is tortuous. There is distal atherosclerotic disease. There is a stent extending from the distal common carotid artery through the bifurcation and ICA bulb. Minimal diameter of the stent laminae in the ICA bulb region is measured at 2.5 mm. Expected diameter is 5 mm. Therefore, this indicates a 50% stenosis. External carotid remains patent. No ICA stenosis distal to the bulb. Left carotid system: Common carotid artery is tortuous but widely patent to the bifurcation region. Carotid bifurcation is widely patent. No stenosis or irregularity. Cervical internal carotid artery is tortuous but widely patent. Vertebral arteries: Left vertebral artery arises directly from the arch as noted above. This is a small vessel that is patent through the cervical region to the foramen magnum. Right vertebral artery is also small vessel with normally positioned origin from the subclavian. The origin is widely patent in the vessel does not show any focal stenosis in the cervical region. Skeleton: Ordinary cervical spondylosis. Other neck: No mass or lymphadenopathy. Upper chest: Mild  scarring.  No active process. Review of the MIP images confirms the above findings CTA HEAD FINDINGS Anterior circulation: Both internal carotid arteries are patent through the skullbase an siphon regions. There is atherosclerotic calcification in the carotid siphon regions but no stenosis greater than 30% suspected. There is a patent trigeminal artery on the left with atherosclerotic calcification resulting in stenosis of 50%. See below. The anterior and middle cerebral vessels are patent without proximal stenosis, aneurysm or vascular malformation. Posterior circulation: Small vertebral arteries are patent through the foramen magnum to the basilar. Posterior inferior cerebellar artery show flow bilaterally. The basilar is a very  diminutive vessel. The majority of the distal posterior circulation supply derives from the patent trigeminal artery on the left. The distal basilar artery is widely patent with good flow in the superior cerebellar and posterior cerebral territories. Venous sinuses: Patent and normal. Anatomic variants: Left trigeminal artery as discussed above. Delayed phase: No abnormal enhancement. Review of the MIP images confirms the above findings IMPRESSION: Previous of right carotid stent. Minimal diameter in the proximal ICA region within the stent is 2.5 mm, consistent with a 50% stenosis. Wide patency beyond that. Mild atherosclerotic change at the left carotid bifurcation but no stenosis or significant irregularity. Tortuous internal carotid arteries bilaterally. Aortic atherosclerosis. Atherosclerotic disease in both carotid siphon regions without stenosis greater than 30% on either side. 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 Diminutive vertebral arteries and small proximal basilar artery. Distal basilar supply drives primarily from the left trigeminal artery. No evidence of intracranial branch vessel occlusion. Electronically Signed   By: Nelson Chimes M.D.   On: 04/01/2017 10:15   Ct Cervical Spine Wo Contrast  Result Date: 03/31/2017 CLINICAL DATA:  Pain following fall. Slurred speech. Left mouth droop. Left-sided weakness EXAM: CT HEAD WITHOUT CONTRAST CT CERVICAL SPINE WITHOUT CONTRAST TECHNIQUE: Multidetector CT imaging of the head and cervical spine was performed following the standard protocol without intravenous contrast. Multiplanar CT image reconstructions of the cervical spine were also generated. COMPARISON:  Head CT November 27, 2016 FINDINGS: CT HEAD FINDINGS Brain: There is mild diffuse atrophy, stable. There is no intracranial mass, hemorrhage, extra-axial fluid collection, or midline shift. There is patchy small vessel disease in the  centra semiovale bilaterally. There is a prior lacunar infarct in the right internal capsule just anterior to the genu. There is evidence of a prior small lacunar infarct in the head of the caudate nucleus on the right. There is evidence of a prior small infarct in the superior left cerebellum. There is no new gray-white compartment lesion. No acute infarct evident. Vascular: There is no appreciable hyperdense vessel. There is calcification in each carotid siphon region. Skull: The bony calvarium appears intact. There is a minimal left superior frontal exostosis, benign and stable. Sinuses/Orbits: There is opacification in multiple ethmoid air cells bilaterally. No intraorbital lesions are identified. Patient appears to have had previous cataract removal on the right, stable. Other: Mastoid air cells are clear. CT CERVICAL SPINE FINDINGS Alignment:  There is no appreciable spondylolisthesis. Skull base and vertebrae: Skull base and craniocervical junction regions appear normal. No evident fracture. No blastic or lytic bone lesions. Soft tissues and spinal canal: Prevertebral soft tissues and predental space regions are normal. No paraspinous lesion. No cord or canal hematoma. Disc levels: There is moderately severe disc space narrowing at C4-5 and C6-7. There is moderate disc space narrowing at C3-4, C5-6, and C7-T1. There are  anterior osteophytes at all levels except for C2. There is facet hypertrophy at multiple levels. There is exit foraminal narrowing with impression on the exiting nerve root on the left at C3-4, on the left at C4-5, and on the left at C6-7. Milder exit foraminal narrowing is noted at C5-6 and C6-7 on the right. No frank disc extrusion or high-grade stenosis. Upper chest: Visualized lung apical regions are clear. Other: There is a stent in the right carotid artery. IMPRESSION: CT head: Stable atrophy with fairly extensive supratentorial small vessel disease, stable. Prior lacunar infarcts noted  in the right basal ganglia and left cerebellum. No acute infarct. No hemorrhage, mass, or extra-axial fluid. Foci of arterial vascular calcification noted. Opacification in multiple ethmoid air cells. CT cervical spine: No fracture or spondylolisthesis. There is multilevel osteoarthritic change. There is a stent in the right carotid artery. Electronically Signed   By: Lowella Grip III M.D.   On: 03/31/2017 14:21     Subjective: No complaints.   Discharge Exam: Vitals:   04/06/17 2159 04/07/17 0515  BP: 129/80 (!) 142/86  Pulse: 69 66  Resp: 17 18  Temp: 98.2 F (36.8 C) 98.1 F (36.7 C)   Vitals:   04/06/17 0917 04/06/17 1710 04/06/17 2159 04/07/17 0515  BP: (!) 127/91 111/64 129/80 (!) 142/86  Pulse:  65 69 66  Resp:  14 17 18   Temp:  98.2 F (36.8 C) 98.2 F (36.8 C) 98.1 F (36.7 C)  TempSrc:  Oral Oral Oral  SpO2:  99% 100% 100%  Weight:      Height:        General: Pt is alert, awake, not in acute distress Cardiovascular: RRR, S1/S2 +, no rubs, no gallops Respiratory: CTA bilaterally, no wheezing, no rhonchi Abdominal: Soft, NT, ND, bowel sounds + Extremities: no edema, no cyanosis    The results of significant diagnostics from this hospitalization (including imaging, microbiology, ancillary and laboratory) are listed below for reference.     Microbiology: Recent Results (from the past 240 hour(s))  Urine culture     Status: Abnormal   Collection Time: 03/31/17 12:31 PM  Result Value Ref Range Status   Specimen Description URINE, RANDOM  Final   Special Requests NONE  Final   Culture (A)  Final    <10,000 COLONIES/mL INSIGNIFICANT GROWTH Performed at [facility], [place] [address], [place], [place] [address]    Report Status 04/02/2017 FINAL  Final     Labs: BNP (last 3 results) No results for input(s): BNP in the last 8760 hours. Basic Metabolic Panel:  Recent Labs Lab 03/31/17 1242 04/02/17 1159  NA 136 138  K 3.8 3.6  CL 104 108   CO2 25 24  GLUCOSE 100* 124*  BUN 16 11  CREATININE 0.79 0.84  CALCIUM 8.4* 7.7*   Liver Function Tests:  Recent Labs Lab 03/31/17 1242  AST 20  ALT 12*  ALKPHOS 40  BILITOT 0.7  PROT 6.6  ALBUMIN 2.5*   No results for input(s): LIPASE, AMYLASE in the last 168 hours. No results for input(s): AMMONIA in the last 168 hours. CBC:  Recent Labs Lab 03/31/17 1242 04/02/17 1159 04/04/17 1210  WBC 4.5 4.6 6.3  NEUTROABS 3.0  --   --   HGB 9.3* 9.1* 9.9*  HCT 26.3* 26.2* 28.3*  MCV 85.7 84.0 83.7  PLT 136* 122* 134*   Cardiac Enzymes:  Recent Labs Lab 03/31/17 1242  TROPONINI <0.03   BNP: Invalid input(s):  POCBNP CBG: No results for input(s): GLUCAP in the last 168 hours. D-Dimer No results for input(s): DDIMER in the last 72 hours. Hgb A1c No results for input(s): HGBA1C in the last 72 hours. Lipid Profile No results for input(s): CHOL, HDL, LDLCALC, TRIG, CHOLHDL, LDLDIRECT in the last 72 hours. Thyroid function studies No results for input(s): TSH, T4TOTAL, T3FREE, THYROIDAB in the last 72 hours.  Invalid input(s): FREET3 Anemia work up No results for input(s): VITAMINB12, FOLATE, FERRITIN, TIBC, IRON, RETICCTPCT in the last 72 hours. Urinalysis    Component Value Date/Time   COLORURINE YELLOW 12/06/2016 1130   APPEARANCEUR CLEAR 12/06/2016 1130   APPEARANCEUR Hazy 02/02/2014 1501   LABSPEC 1.025 12/06/2016 1130   LABSPEC 1.023 02/02/2014 1501   PHURINE 5.0 12/06/2016 1130   GLUCOSEU NEGATIVE 12/06/2016 1130   GLUCOSEU Negative 02/02/2014 1501   HGBUR SMALL (A) 12/06/2016 1130   BILIRUBINUR NEGATIVE 12/06/2016 1130   BILIRUBINUR Negative 02/02/2014 1501   KETONESUR NEGATIVE 12/06/2016 1130   PROTEINUR NEGATIVE 12/06/2016 1130   NITRITE NEGATIVE 12/06/2016 1130   LEUKOCYTESUR NEGATIVE 12/06/2016 1130   LEUKOCYTESUR Negative 02/02/2014 1501   Sepsis Labs Invalid input(s): PROCALCITONIN,  WBC,  LACTICIDVEN Microbiology Recent Results (from the  past 240 hour(s))  Urine culture     Status: Abnormal   Collection Time: 03/31/17 12:31 PM  Result Value Ref Range Status   Specimen Description URINE, RANDOM  Final   Special Requests NONE  Final   Culture (A)  Final    <10,000 COLONIES/mL INSIGNIFICANT GROWTH Performed at [facility], [place] [address] [place], [place] [address]    Report Status 04/02/2017 FINAL  Final     Time coordinating discharge: Over 30 minutes  SIGNED:   Hosie Poisson, MD  [facility] 04/07/2017, 10:29 AM Pager   If 7PM-7AM, please contact night-coverage [email or web address] Password [email or web address]

## 2017-04-07 NOTE — NC FL2 (Signed)
[place] MEDICAID FL2 LEVEL OF CARE SCREENING TOOL     IDENTIFICATION  Patient Name: Kenneth Vazquez Birthdate: 11-06-1940 Sex: male Admission Date (Current Location): 03/31/2017  [facility] and [place] Number:  [employer] and Address:  The [place]. [facility], [place] [address], [place], [place] [address]      Provider Number: [id number]  Attending Physician Name and Address:  Hosie Poisson, MD  Relative Name and Phone Number:  Earl Lites, [phone]    Current Level of Care: Hospital Recommended Level of Care: [place] Prior Approval Number:    Date Approved/Denied:   PASRR Number:    Discharge Plan: SNF    Current Diagnoses: Patient Active Problem List   Diagnosis Date Noted  . Malnutrition of moderate degree 04/02/2017  . CVA (cerebral vascular accident) ([place]) 03/31/2017  . Essential hypertension 03/31/2017  . Dementia 03/31/2017  . Severe protein-calorie malnutrition ([place]) 03/31/2017  . Normocytic anemia 03/31/2017  . Hyperlipidemia 03/31/2017  . Thrombocytopenia ([place]) 03/31/2017  . Seizures ([place]) 11/27/2016    Orientation RESPIRATION BLADDER Height & Weight     Self  Normal Continent, External catheter Weight: 160 lb (72.6 kg) Height:  5\' 10"  (177.8 cm)  BEHAVIORAL SYMPTOMS/MOOD NEUROLOGICAL BOWEL NUTRITION STATUS      Continent Diet (Please see DC summary)  AMBULATORY STATUS COMMUNICATION OF NEEDS Skin     Verbally Normal                       Personal Care Assistance Level of Assistance  Bathing, Feeding, Dressing Bathing Assistance: Maximum assistance Feeding assistance: Limited assistance Dressing Assistance: Limited assistance     Functional Limitations Info  Sight, Hearing, Speech Sight Info: Adequate Hearing Info: Adequate Speech Info: Adequate    SPECIAL CARE FACTORS FREQUENCY  PT (By licensed PT), OT (By licensed OT)     PT Frequency: 5x wk OT Frequency: 5x wk             Contractures      Additional Factors Info  Code Status, Allergies Code Status Info: DNR Allergies Info: Sulfa Antibiotics           Current Medications (04/07/2017):  This is the current hospital active medication list Current Facility-Administered Medications  Medication Dose Route Frequency Provider Last Rate Last Dose  . acetaminophen (TYLENOL) tablet 650 mg  650 mg Oral Q4H PRN Karmen Bongo, MD       Or  . acetaminophen (TYLENOL) solution 650 mg  650 mg Per Tube Q4H PRN Karmen Bongo, MD       Or  . acetaminophen (TYLENOL) suppository 650 mg  650 mg Rectal Q4H PRN Karmen Bongo, MD      . amLODipine (NORVASC) tablet 2.5 mg  2.5 mg Oral Daily Jani Gravel, MD   2.5 mg at 04/07/17 0936  . aspirin tablet 325 mg  325 mg Oral Daily Hosie Poisson, MD   325 mg at 04/07/17 0865  . atorvastatin (LIPITOR) tablet 20 mg  20 mg Oral q1800 Patteson, Samuel A, NP   20 mg at 04/06/17 1826  . donepezil (ARICEPT) tablet 10 mg  10 mg Oral QHS Greta Doom, MD   10 mg at 04/06/17 2245  . feeding supplement (ENSURE ENLIVE) (ENSURE ENLIVE) liquid 237 mL  237 mL Oral BID BM Hosie Poisson, MD   237 mL at 04/07/17 0937  . hydrALAZINE (APRESOLINE) injection 10 mg  10 mg Intravenous Q6H PRN Jani Gravel, MD  10 mg at 04/04/17 0457  . levETIRAcetam (KEPPRA) tablet 750 mg  750 mg Oral BID Hosie Poisson, MD   750 mg at 04/07/17 7824     Discharge Medications: Please see discharge summary for a list of discharge medications.  Relevant Imaging Results:  Relevant Lab Results:   Additional Information SSN: [place], [place]

## 2017-04-07 NOTE — Progress Notes (Addendum)
Pt left via transport.

## 2017-04-07 NOTE — Progress Notes (Signed)
Physical Therapy Treatment Patient Details Name: Kenneth Vazquez MRN: [id number] DOB: November 19, 1940 Today's Date: 04/07/2017    History of Present Illness Pt is a 76 y/o male s/p Lt MCA CVA. PMH includes CVA x 2 in 2105, seizures, HTN, pacemaker, dementia, and carotid artery disease     PT Comments    Patient making slow gains with mobility.  Agree with need for SNF at d/c for continued therapy.   Follow Up Recommendations  SNF     Equipment Recommendations  None recommended by PT    Recommendations for Other Services       Precautions / Restrictions Precautions Precautions: Fall Restrictions Weight Bearing Restrictions: No    Mobility  Bed Mobility Overal bed mobility: Needs Assistance Bed Mobility: Supine to Sit;Sit to Supine     Supine to sit: Mod assist;+2 for physical assistance     General bed mobility comments: Patient required increased assist to move to sitting, raising trunk to upright position.  Initially patient with poor balance, requiring +2 mod assist.  Worked on shifting trunk forward over hips.  patient with flexed posture and head downward.  Cues to hold head more upright.  Was able to reach min assist with sitting balance.  Transfers Overall transfer level: Needs assistance Equipment used: 2 person hand held assist Transfers: Sit to/from Stand Sit to Stand: +2 physical assistance;Mod assist         General transfer comment: Required +2 mod assist to rise to partial stance position.  Required max assist to maintain balance, with flexed trunk.  Ambulation/Gait                 Stairs            Wheelchair Mobility    Modified Rankin (Stroke Patients Only)       Balance Overall balance assessment: Needs assistance Sitting-balance support: Single extremity supported Sitting balance-Leahy Scale: Poor     Standing balance support: Bilateral upper extremity supported Standing balance-Leahy Scale: Zero                               Cognition Arousal/Alertness: Awake/alert Behavior During Therapy: Flat affect Overall Cognitive Status: Impaired/Different from baseline Area of Impairment: Attention;Following commands;Safety/judgement;Awareness;Problem solving                   Current Attention Level: Sustained   Following Commands: Follows one step commands inconsistently;Follows one step commands with increased time Safety/Judgement: Decreased awareness of safety;Decreased awareness of deficits   Problem Solving: Slow processing;Difficulty sequencing;Requires verbal cues;Requires tactile cues        Exercises      General Comments        Pertinent Vitals/Pain Pain Assessment: No/denies pain    Home Living                      Prior Function            PT Goals (current goals can now be found in the care plan section) Progress towards PT goals: Progressing toward goals    Frequency    Min 3X/week      PT Plan Current plan remains appropriate    Co-evaluation              AM-PAC PT "6 Clicks" Daily Activity  Outcome Measure  Difficulty turning over in bed (including adjusting bedclothes, sheets and blankets)?: Total Difficulty moving from lying on  back to sitting on the side of the bed? : Total Difficulty sitting down on and standing up from a chair with arms (e.g., wheelchair, bedside commode, etc,.)?: Total Help needed moving to and from a bed to chair (including a wheelchair)?: A Lot Help needed walking in hospital room?: Total Help needed climbing 3-5 steps with a railing? : Total 6 Click Score: 7    End of Session Equipment Utilized During Treatment: Gait belt Activity Tolerance: Patient limited by fatigue Patient left: in bed;with call bell/phone within reach;with bed alarm set;with family/visitor present Nurse Communication: Mobility status PT Visit Diagnosis: Unsteadiness on feet (R26.81);Other abnormalities of gait and mobility  (R26.89);Muscle weakness (generalized) (M62.81);Other symptoms and signs involving the nervous system (R29.898);Hemiplegia and hemiparesis Hemiplegia - Right/Left: Right Hemiplegia - dominant/non-dominant: Dominant Hemiplegia - caused by: Cerebral infarction     Time: 5621-3086 PT Time Calculation (min) (ACUTE ONLY): 16 min  Charges:  $Therapeutic Activity: 8-22 mins                    G Codes:       Carita Pian. Sanjuana Kava, [facility] Acute Rehab Services Pager [place] 04/07/2017, 4:59 PM

## 2017-04-07 NOTE — Progress Notes (Signed)
Clinical Social Worker facilitated patient discharge including contacting patient family and facility to confirm patient discharge plans.  Clinical information faxed to facility and family agreeable with plan.  CSW arranged ambulance transport via [facility] to [facility] .  RN to call [phone] for report prior to discharge.  Clinical Social Worker will sign off for now as social work intervention is no longer needed. Please consult us again if new need arises.  Najai Waszak, MSW, LCSWA [phone]  

## 2017-04-07 NOTE — NC FL2 (Signed)
[place] MEDICAID FL2 LEVEL OF CARE SCREENING TOOL     IDENTIFICATION  Patient Name: Kenneth Vazquez Birthdate: 12-23-1940 Sex: male Admission Date (Current Location): 03/31/2017  [facility] and [place] Number:  [employer] and Address:  The [place]. [facility], [place] [address] [place], [place] [address]      Provider Number: [id number]  Attending Physician Name and Address:  Hosie Poisson, MD  Relative Name and Phone Number:  Earl Lites, [phone]    Current Level of Care: Hospital Recommended Level of Care: [place] Prior Approval Number:    Date Approved/Denied:   PASRR Number: [id number]  Discharge Plan: SNF    Current Diagnoses: Patient Active Problem List   Diagnosis Date Noted  . Malnutrition of moderate degree 04/02/2017  . CVA (cerebral vascular accident) ([place]) 03/31/2017  . Essential hypertension 03/31/2017  . Dementia 03/31/2017  . Severe protein-calorie malnutrition ([place]) 03/31/2017  . Normocytic anemia 03/31/2017  . Hyperlipidemia 03/31/2017  . Thrombocytopenia ([place]) 03/31/2017  . Seizures ([place]) 11/27/2016    Orientation RESPIRATION BLADDER Height & Weight     Self  Normal Continent, External catheter Weight: 160 lb (72.6 kg) Height:  5\' 10"  (177.8 cm)  BEHAVIORAL SYMPTOMS/MOOD NEUROLOGICAL BOWEL NUTRITION STATUS      Continent Diet (Please see DC summary)  AMBULATORY STATUS COMMUNICATION OF NEEDS Skin     Verbally Normal                       Personal Care Assistance Level of Assistance  Bathing, Feeding, Dressing Bathing Assistance: Maximum assistance Feeding assistance: Limited assistance Dressing Assistance: Limited assistance     Functional Limitations Info  Sight, Hearing, Speech Sight Info: Adequate Hearing Info: Adequate Speech Info: Adequate    SPECIAL CARE FACTORS FREQUENCY  PT (By licensed PT), OT (By licensed OT)     PT Frequency: 5x wk OT Frequency: 5x wk             Contractures      Additional Factors Info  Code Status, Allergies Code Status Info: DNR Allergies Info: Sulfa Antibiotics           Current Medications (04/07/2017):  This is the current hospital active medication list Current Facility-Administered Medications  Medication Dose Route Frequency Provider Last Rate Last Dose  . acetaminophen (TYLENOL) tablet 650 mg  650 mg Oral Q4H PRN Karmen Bongo, MD       Or  . acetaminophen (TYLENOL) solution 650 mg  650 mg Per Tube Q4H PRN Karmen Bongo, MD       Or  . acetaminophen (TYLENOL) suppository 650 mg  650 mg Rectal Q4H PRN Karmen Bongo, MD      . amLODipine (NORVASC) tablet 2.5 mg  2.5 mg Oral Daily Jani Gravel, MD   2.5 mg at 04/07/17 0936  . aspirin tablet 325 mg  325 mg Oral Daily Hosie Poisson, MD   325 mg at 04/07/17 9702  . atorvastatin (LIPITOR) tablet 20 mg  20 mg Oral q1800 Patteson, Samuel A, NP   20 mg at 04/06/17 1826  . donepezil (ARICEPT) tablet 10 mg  10 mg Oral QHS Greta Doom, MD   10 mg at 04/06/17 2245  . feeding supplement (ENSURE ENLIVE) (ENSURE ENLIVE) liquid 237 mL  237 mL Oral BID BM Hosie Poisson, MD   237 mL at 04/07/17 0937  . hydrALAZINE (APRESOLINE) injection 10 mg  10 mg Intravenous Q6H PRN Jani Gravel, MD  10 mg at 04/04/17 0457  . levETIRAcetam (KEPPRA) tablet 750 mg  750 mg Oral BID Hosie Poisson, MD   750 mg at 04/07/17 8867     Discharge Medications: Please see discharge summary for a list of discharge medications.  Relevant Imaging Results:  Relevant Lab Results:   Additional Information SSN: [place], [place]

## 2017-04-07 NOTE — Plan of Care (Signed)
Problem: Education: Goal: Knowledge of secondary prevention will improve Outcome: Progressing Pt's knowledge of stroke will improve prior to discharge.

## 2017-05-12 ENCOUNTER — Encounter: Payer: Self-pay | Admitting: Cardiovascular Disease

## 2017-05-12 ENCOUNTER — Ambulatory Visit (INDEPENDENT_AMBULATORY_CARE_PROVIDER_SITE_OTHER): Payer: Medicare Other | Admitting: Cardiovascular Disease

## 2017-05-12 VITALS — BP 110/60 | HR 84 | Ht 72.0 in | Wt 150.0 lb

## 2017-05-12 DIAGNOSIS — Z950 Presence of cardiac pacemaker: Secondary | ICD-10-CM | POA: Diagnosis not present

## 2017-05-12 DIAGNOSIS — I480 Paroxysmal atrial fibrillation: Secondary | ICD-10-CM

## 2017-05-12 MED ORDER — APIXABAN 5 MG PO TABS
5.0000 mg | ORAL_TABLET | Freq: Two times a day (BID) | ORAL | 11 refills | Status: DC
Start: 2017-05-12 — End: 2017-08-08

## 2017-05-12 NOTE — Progress Notes (Signed)
Cardiology Office Note:    Date:  05/12/2017   ID:  Kenneth Vazquez, DOB [birth year], MRN [id number]  PCP:  Jodi Marble, MD  Cardiologist:  Mertie Moores, MD    Referring MD: Jodi Marble, MD   Chief Complaint  Patient presents with  . New Patient (Initial Visit)    atrial fib, HTN    History of Present Illness:    Kenneth Vazquez is a 76 y.o. male with a hx of Hypertension, atrial fibrillation, hyperlipidema, pacer  . He is referred today to establish with cardiology in [place].  Pt is here alone.  Does not know why he is here today. Has a pacer - placed about 3 years ago  Has a hx of CVA    Has been seen at [facility]   He was recently admitted to [facility] with a stroke. Was sent home on ASA 325 mg a day Pacer interrogation showed atrial tach - no mention of atrial fib  ( he is in atrial fib today )  Echo showed LV EF of 45-50%. Grade 1 diastolic dysfunction .   Mild MR   Has been [employer] ( he does not know where is living now - has a fair amount of dementia )   Denies any CP or dyspnea.      Past Medical History:  Diagnosis Date  . Arthritis   . Carotid artery disease ([place])   . Dementia   . Hypertension   . Pacemaker   . Prostate cancer ([place])   . Seizures ([place])    last seizure was a couple of weeks ago  . Stroke [facility]) x2, 2015   residual weakness, wife is unsure which side    Past Surgical History:  Procedure Laterality Date  . CAROTID STENT    . left total knee replacement    . PACEMAKER INSERTION    . PROSTATE CRYOABLATION      Current Medications: Current Meds  Medication Sig  . amLODipine (NORVASC) 2.5 MG tablet Take 1 tablet (2.5 mg total) by mouth daily.  Marland Kitchen aspirin 325 MG tablet Take 1 tablet (325 mg total) by mouth daily.  Marland Kitchen atorvastatin (LIPITOR) 20 MG tablet Take 1 tablet (20 mg total) by mouth daily at 6 PM.  . docusate sodium (COLACE) 100 MG capsule Take 100 mg by mouth daily.  Marland Kitchen donepezil (ARICEPT) 10  MG tablet Take 1 tablet (10 mg total) by mouth every morning.  . feeding supplement, ENSURE ENLIVE, (ENSURE ENLIVE) LIQD Take 237 mLs by mouth 2 (two) times daily between meals.  . Ferrous Gluconate (IRON 27 PO) Take 1 tablet by mouth daily.  . furosemide (LASIX) 20 MG tablet Take 20 mg by mouth daily.  Marland Kitchen levETIRAcetam (KEPPRA) 500 MG tablet Take 500 mg by mouth 2 (two) times daily.  . memantine (NAMENDA) 5 MG tablet Take 5 mg by mouth 2 (two) times daily.  Marland Kitchen MYRBETRIQ 25 MG TB24 tablet Take 1 tablet by mouth daily.  . traZODone (DESYREL) 50 MG tablet Take 50-100 mg by mouth at bedtime.     Allergies:   Sulfa antibiotics and Sulfasalazine   Social History   Social History  . Marital status: Married    Spouse name: N/A  . Number of children: N/A  . Years of education: N/A   Social History Main Topics  . Smoking status: Never Smoker  . Smokeless tobacco: Never Used  . Alcohol use No     Comment: h/o  heavy use, no use in recent years  . Drug use: No  . Sexual activity: Not Asked   Other Topics Concern  . None   Social History Narrative  . None     Family History: The patient's family history includes CVA in his mother. ROS:   Please see the history of present illness.    Pt has dementia.  Unable to give ROS   All other systems reviewed and are negative.  EKGs/Labs/Other Studies Reviewed:    The following studies were reviewed today: Records from recent hospitalization   EKG:  EKG is  ordered today.  The ekg ordered today demonstrates Atrial fib with HR of 84.   LVH with repol. The AFib is new compared with previous studies .  Recent Labs: 03/31/2017: ALT 12 04/02/2017: BUN 11; Creatinine, Ser 0.84; Potassium 3.6; Sodium 138 04/04/2017: Hemoglobin 9.9; Platelets 134  Recent Lipid Panel    Component Value Date/Time   CHOL 97 04/01/2017 0411   CHOL 104 02/10/2014 0128   TRIG 35 04/01/2017 0411   TRIG 50 02/10/2014 0128   HDL 33 (L) 04/01/2017 0411   HDL 35 (L)  02/10/2014 0128   CHOLHDL 2.9 04/01/2017 0411   VLDL 7 04/01/2017 0411   VLDL 10 02/10/2014 0128   LDLCALC 57 04/01/2017 0411   LDLCALC 59 02/10/2014 0128    Physical Exam:    VS:  BP 110/60   Pulse 84   Ht 6' (1.829 m)   Wt 150 lb (68 kg)   BMI 20.34 kg/m     Wt Readings from Last 3 Encounters:  05/12/17 150 lb (68 kg)  04/01/17 160 lb (72.6 kg)  03/01/17 170 lb (77.1 kg)     GEN: elderly , chronically ill appearig male, unable to answer many questions, does not know why he is here HEENT: Normal NECK: No JVD; No carotid bruits LYMPHATICS: No lymphadenopathy CARDIAC: Irreg. Irreg.  RESPIRATORY:  Few basilar rales.  Cleared with deep breaths  ABDOMEN: Soft, non-tender, non-distended MUSCULOSKELETAL:  No edema; No deformity  SKIN: Warm and dry NEUROLOGIC:  Weakness on the right side.  Examined in the wheelchair  PSYCHIATRIC:  Slow to respond  ASSESSMENT:    No diagnosis found. PLAN:    In order of problems listed above:  1. Atrial fibrillation: The patient is found to have atrial fibrillation. This apparently is a new diagnosis for him. He has a history of pacemaker and was recently admitted to the hospital with a stroke. He was sent home on aspirin 325 mg a day but clearly he needs to be on oral anticoagulation.  We'll stop the aspirin and start  Eliquis 5 mg PO BID  He'll need a basic metabolic profile and CBC in one month. I'll see him in 6 months.  2. History of pacemaker implantation: He does not know when his pacemaker was placed. He thinks was placed at [facility] but I do not have any records of that. We'll set him up for a pacer clinic.  3. Hypertension: Continue current medications.     Medication Adjustments/Labs and Tests Ordered: Current medicines are reviewed at length with the patient today.  Concerns regarding medicines are outlined above.  No orders of the defined types were placed in this encounter.  No orders of the defined types were  placed in this encounter.   Signed, Mertie Moores, MD  05/12/2017 10:32 AM    [facility]

## 2017-05-12 NOTE — Patient Instructions (Signed)
Medication Instructions:  STOP Aspirin START Eliquis 5 mg twice daily   Labwork: Your physician recommends that you return for lab work in: 1 month for BMET, CBC   Testing/Procedures: None Ordered   Follow-Up: You have been referred to EP for evaluation of your pacemaker   Your physician wants you to follow-up in: 6 months with Dr. Acie Fredrickson.  You will receive a reminder letter in the mail two months in advance. If you don't receive a letter, please call our office to schedule the follow-up appointment.   If you need a refill on your cardiac medications before your next appointment, please call your pharmacy.   Thank you for choosing [facility]! Christen Bame, RN [phone]

## 2017-05-16 ENCOUNTER — Emergency Department (HOSPITAL_COMMUNITY): Payer: Medicare Other

## 2017-05-16 ENCOUNTER — Encounter (HOSPITAL_COMMUNITY): Payer: Self-pay | Admitting: Emergency Medicine

## 2017-05-16 ENCOUNTER — Emergency Department (HOSPITAL_COMMUNITY)
Admission: EM | Admit: 2017-05-16 | Discharge: 2017-05-16 | Disposition: A | Discharge status: Home / Self Care | Payer: Medicare Other | Attending: Emergency Medicine | Admitting: Emergency Medicine

## 2017-05-16 DIAGNOSIS — Z79899 Other long term (current) drug therapy: Secondary | ICD-10-CM | POA: Insufficient documentation

## 2017-05-16 DIAGNOSIS — Z8546 Personal history of malignant neoplasm of prostate: Secondary | ICD-10-CM | POA: Insufficient documentation

## 2017-05-16 DIAGNOSIS — Z7901 Long term (current) use of anticoagulants: Secondary | ICD-10-CM | POA: Insufficient documentation

## 2017-05-16 DIAGNOSIS — I4891 Unspecified atrial fibrillation: Secondary | ICD-10-CM | POA: Diagnosis not present

## 2017-05-16 DIAGNOSIS — I10 Essential (primary) hypertension: Secondary | ICD-10-CM | POA: Diagnosis not present

## 2017-05-16 DIAGNOSIS — Z96652 Presence of left artificial knee joint: Secondary | ICD-10-CM | POA: Insufficient documentation

## 2017-05-16 DIAGNOSIS — Z8673 Personal history of transient ischemic attack (TIA), and cerebral infarction without residual deficits: Secondary | ICD-10-CM | POA: Insufficient documentation

## 2017-05-16 DIAGNOSIS — I2510 Atherosclerotic heart disease of native coronary artery without angina pectoris: Secondary | ICD-10-CM | POA: Diagnosis not present

## 2017-05-16 DIAGNOSIS — Z950 Presence of cardiac pacemaker: Secondary | ICD-10-CM | POA: Insufficient documentation

## 2017-05-16 DIAGNOSIS — R4182 Altered mental status, unspecified: Secondary | ICD-10-CM | POA: Diagnosis present

## 2017-05-16 LAB — CBC WITH DIFFERENTIAL/PLATELET
BASOS PCT: 0 %
Basophils Absolute: 0 10*3/uL (ref 0.0–0.1)
EOS ABS: 0.3 10*3/uL (ref 0.0–0.7)
EOS PCT: 6 %
HCT: 28.2 % — ABNORMAL LOW (ref 39.0–52.0)
HEMOGLOBIN: 9.5 g/dL — AB (ref 13.0–17.0)
Lymphocytes Relative: 17 %
Lymphs Abs: 0.9 10*3/uL (ref 0.7–4.0)
MCH: 29 pg (ref 26.0–34.0)
MCHC: 33.7 g/dL (ref 30.0–36.0)
MCV: 86 fL (ref 78.0–100.0)
MONOS PCT: 9 %
Monocytes Absolute: 0.5 10*3/uL (ref 0.1–1.0)
NEUTROS PCT: 68 %
Neutro Abs: 3.6 10*3/uL (ref 1.7–7.7)
PLATELETS: 160 10*3/uL (ref 150–400)
RBC: 3.28 MIL/uL — ABNORMAL LOW (ref 4.22–5.81)
RDW: 14.7 % (ref 11.5–15.5)
WBC: 5.3 10*3/uL (ref 4.0–10.5)

## 2017-05-16 LAB — URINALYSIS, ROUTINE W REFLEX MICROSCOPIC
BILIRUBIN URINE: NEGATIVE
Bacteria, UA: NONE SEEN
Glucose, UA: NEGATIVE mg/dL
Ketones, ur: NEGATIVE mg/dL
Leukocytes, UA: NEGATIVE
Nitrite: NEGATIVE
PH: 6 (ref 5.0–8.0)
Protein, ur: NEGATIVE mg/dL
SPECIFIC GRAVITY, URINE: 1.015 (ref 1.005–1.030)
SQUAMOUS EPITHELIAL / LPF: NONE SEEN

## 2017-05-16 LAB — COMPREHENSIVE METABOLIC PANEL
ALK PHOS: 48 U/L (ref 38–126)
ALT: 13 U/L — AB (ref 17–63)
AST: 18 U/L (ref 15–41)
Albumin: 2.2 g/dL — ABNORMAL LOW (ref 3.5–5.0)
Anion gap: 4 — ABNORMAL LOW (ref 5–15)
BUN: 17 mg/dL (ref 6–20)
CALCIUM: 8.3 mg/dL — AB (ref 8.9–10.3)
CO2: 27 mmol/L (ref 22–32)
CREATININE: 1.04 mg/dL (ref 0.61–1.24)
Chloride: 106 mmol/L (ref 101–111)
Glucose, Bld: 122 mg/dL — ABNORMAL HIGH (ref 65–99)
Potassium: 4.4 mmol/L (ref 3.5–5.1)
Sodium: 137 mmol/L (ref 135–145)
Total Bilirubin: 0.6 mg/dL (ref 0.3–1.2)
Total Protein: 6.8 g/dL (ref 6.5–8.1)

## 2017-05-16 LAB — CBG MONITORING, ED: Glucose-Capillary: 69 mg/dL (ref 65–99)

## 2017-05-16 LAB — I-STAT CG4 LACTIC ACID, ED: LACTIC ACID, VENOUS: 1.77 mmol/L (ref 0.5–1.9)

## 2017-05-16 LAB — TROPONIN I

## 2017-05-16 MED ORDER — SODIUM CHLORIDE 0.9 % IV SOLN
INTRAVENOUS | Status: DC
  Administered 2017-05-16: 16:00:00 via INTRAVENOUS

## 2017-05-16 MED ORDER — SODIUM CHLORIDE 0.9 % IV BOLUS (SEPSIS)
1000.0000 mL | Freq: Once | INTRAVENOUS | Status: AC
  Administered 2017-05-16: 1000 mL via INTRAVENOUS

## 2017-05-16 NOTE — ED Triage Notes (Signed)
Per ems, pt from [facility] for rehab for stroke, called out for low o2, pt had no complaints, but vitals were checked by facility, o2 saturations, pt has afib on the monitor, no hx of afib. Pt taking elaquis for a stroke with right sided deficits, hx of dementia, mostly AAOX4 per facility. Pt is 100% on RA in ED room. Pt has no complaints other than not feeling good.

## 2017-05-16 NOTE — ED Triage Notes (Signed)
Pt has weakness on his right side from previous stroke. Hx of seizures. Pt taking keppra for seizures

## 2017-05-16 NOTE — ED Provider Notes (Addendum)
[facility] DEPT Provider Note   CSN: [id number] Arrival date & time: 05/16/17  1352     History   Chief Complaint No chief complaint on file.   HPI Kenneth Vazquez is a 76 y.o. male.  Pt presents to the ED today with mental status change.  He has a hx of multiple strokes and dementia, so is unable to contribute to the history.  The pt lives at [facility].  They tried to get an O2 sat and were unable to get an accurate sat, so they sent him in here.  The pt's wife said he is a little confused, but that is not necessarily abnormal.  Pt has not complaints of pain.      Past Medical History:  Diagnosis Date  . Arthritis   . Carotid artery disease ([place])   . Dementia   . Hypertension   . Pacemaker   . Prostate cancer ([place])   . Seizures ([place])    last seizure was a couple of weeks ago  . Stroke [facility]) x2, 2015   residual weakness, wife is unsure which side    Patient Active Problem List   Diagnosis Date Noted  . Paroxysmal atrial fibrillation ([place]) 05/12/2017  . Pacemaker 05/12/2017  . Malnutrition of moderate degree 04/02/2017  . CVA (cerebral vascular accident) ([place]) 03/31/2017  . Essential hypertension 03/31/2017  . Dementia 03/31/2017  . Severe protein-calorie malnutrition ([place]) 03/31/2017  . Normocytic anemia 03/31/2017  . Hyperlipidemia 03/31/2017  . Thrombocytopenia ([place]) 03/31/2017  . Seizures ([place]) 11/27/2016    Past Surgical History:  Procedure Laterality Date  . CAROTID STENT    . left total knee replacement    . PACEMAKER INSERTION    . PROSTATE CRYOABLATION         Home Medications    Prior to Admission medications   Medication Sig Start Date End Date Taking? Authorizing Provider  amLODipine (NORVASC) 2.5 MG tablet Take 1 tablet (2.5 mg total) by mouth daily. 04/08/17  Yes Hosie Poisson, MD  apixaban (ELIQUIS) 5 MG TABS tablet Take 1 tablet (5 mg total) by mouth 2 (two) times daily. 05/12/17  Yes Nahser, Wonda Cheng, MD  atorvastatin (LIPITOR)  20 MG tablet Take 1 tablet (20 mg total) by mouth daily at 6 PM. 04/07/17  Yes Hosie Poisson, MD  docusate sodium (COLACE) 100 MG capsule Take 100 mg by mouth daily.   Yes [provider]  donepezil (ARICEPT) 10 MG tablet Take 1 tablet (10 mg total) by mouth every morning. 11/30/16  Yes Hosie Poisson, MD  feeding supplement, ENSURE ENLIVE, (ENSURE ENLIVE) LIQD Take 237 mLs by mouth 2 (two) times daily between meals. Patient taking differently: Take 237 mLs by mouth 3 (three) times daily between meals.  04/07/17  Yes Hosie Poisson, MD  ferrous sulfate 325 (65 FE) MG tablet Take 325 mg by mouth daily with breakfast.   Yes [provider]  furosemide (LASIX) 20 MG tablet Take 20 mg by mouth daily.   Yes [provider]  levETIRAcetam (KEPPRA) 500 MG tablet Take 500 mg by mouth 2 (two) times daily.   Yes [provider]  memantine (NAMENDA) 5 MG tablet Take 5 mg by mouth 2 (two) times daily. 04/15/17 04/15/18 Yes [provider]  MYRBETRIQ 25 MG TB24 tablet Take 1 tablet by mouth daily. 11/13/16  Yes [provider]  traZODone (DESYREL) 50 MG tablet Take 50 mg by mouth 2 (two) times daily.    Yes [provider]  Ferrous Gluconate (IRON 27 PO) Take 1 tablet by mouth daily.    [provider]    Family History Family History  Problem Relation Age of Onset  . CVA Mother     Social History Social History  Substance Use Topics  . Smoking status: Never Smoker  . Smokeless tobacco: Never Used  . Alcohol use No     Comment: h/o heavy use, no use in recent years     Allergies   Sulfa antibiotics and Sulfasalazine   Review of Systems Review of Systems  All other systems reviewed and are negative.    Physical Exam Updated Vital Signs BP (!) 147/88   Pulse 63   Temp 98.5 F (36.9 C) (Oral)   Resp 13   Ht 6' (1.829 m)   Wt 68 kg (150 lb)   SpO2 100%   BMI 20.34 kg/m   Physical Exam  Constitutional: He appears  well-developed and well-nourished.  HENT:  Head: Normocephalic and atraumatic.  Right Ear: External ear normal.  Left Ear: External ear normal.  Nose: Nose normal.  Mouth/Throat: Oropharynx is clear and moist.  Eyes: Pupils are equal, round, and reactive to light. Conjunctivae and EOM are normal.  Neck: Normal range of motion. Neck supple.  Cardiovascular: Normal rate, normal heart sounds and intact distal pulses.  An irregular rhythm present.  Pulmonary/Chest: Effort normal and breath sounds normal.  Abdominal: Soft. Bowel sounds are normal.  Musculoskeletal: Normal range of motion.  Neurological: He is alert.  Pt knows his name and birthday.  Slight weakness on right from prior CVA.  Skin: Skin is warm.  Psychiatric: He has a normal mood and affect. His behavior is normal. Judgment and thought content normal.  Nursing note and vitals reviewed.    ED Treatments / Results  Labs (all labs ordered are listed, but only abnormal results are displayed) Labs Reviewed  COMPREHENSIVE METABOLIC PANEL - Abnormal; Notable for the following:       Result Value   Glucose, Bld 122 (*)    Calcium 8.3 (*)    Albumin 2.2 (*)    ALT 13 (*)    Anion gap 4 (*)    All other components within normal limits  CBC WITH DIFFERENTIAL/PLATELET - Abnormal; Notable for the following:    RBC 3.28 (*)    Hemoglobin 9.5 (*)    HCT 28.2 (*)    All other components within normal limits  URINALYSIS, ROUTINE W REFLEX MICROSCOPIC - Abnormal; Notable for the following:    Hgb urine dipstick LARGE (*)    All other components within normal limits  TROPONIN I  CBG MONITORING, ED  I-STAT CG4 LACTIC ACID, ED   Hematuria from traumatic cath.  EKG  EKG Interpretation  Date/Time:  Friday May 16 2017 13:58:19 EDT Ventricular Rate:  84 PR Interval:    QRS Duration: 101 QT Interval:  388 QTC Calculation: 459 R Axis:   -64 Text Interpretation:  Age not entered, assumed to be  76 years old for purpose of  ECG interpretation Atrial fibrillation Ventricular bigeminy LVH with secondary repolarization abnormality Inferior infarct, age indeterminate Probable anterior infarct, age indeterminate Lateral leads are also involved Poor data quality ?new onset a.fib.  will repeat EKG Confirmed by Isla Pence [phone]) on 05/16/2017 3:24:12 PM       Radiology Dg Chest 2 View  Result Date: 05/16/2017 CLINICAL DATA:  Shortness of breath, unresponsive, pacemaker, history hypertension, stroke, dementia, prostate cancer EXAM: CHEST  2 VIEW COMPARISON:  03/31/2017 FINDINGS: LEFT subclavian transvenous pacemaker with leads projecting at RIGHT atrium and RIGHT ventricle, unchanged. Enlargement of cardiac silhouette. Atherosclerotic calcification aorta. Mediastinal contours and pulmonary vascularity otherwise normal. LEFT basilar atelectasis, chronic. No definite infiltrate, pleural effusion or pneumothorax. Bones demineralized. IMPRESSION: Enlargement of cardiac silhouette with pacemaker. Chronic LEFT basilar atelectasis. Electronically Signed   By: Lavonia Dana M.D.   On: 05/16/2017 14:47   Ct Head Wo Contrast  Result Date: 05/16/2017 CLINICAL DATA:  History of seizures.  Old stroke. EXAM: CT HEAD WITHOUT CONTRAST TECHNIQUE: Contiguous axial images were obtained from the base of the skull through the vertex without intravenous contrast. COMPARISON:  03/31/2017 FINDINGS: Brain: No change since the previous study. Generalized atrophy. Chronic small-vessel ischemic changes affecting the brainstem, thalami, basal ganglia and cerebral hemispheric deep and subcortical white matter. Old parietal cortical and subcortical infarctions on both sides. No sign of acute or subacute infarction, mass lesion, hemorrhage, hydrocephalus or extra-axial collection. Vascular: There is atherosclerotic calcification of the major vessels at the base of the brain. Skull: Negative Sinuses/Orbits: Clear except for scattered ethmoid air cell  opacification. Orbits negative. Other: None IMPRESSION: No acute finding by CT. Atrophy and chronic ischemic changes throughout the brain as outlined above. Electronically Signed   By: Nelson Chimes M.D.   On: 05/16/2017 14:51    Procedures Procedures (including critical care time)  Medications Ordered in ED Medications  sodium chloride 0.9 % bolus 1,000 mL (0 mLs Intravenous Stopped 05/16/17 1707)    And  0.9 %  sodium chloride infusion ( Intravenous New Bag/Given 05/16/17 1605)     Initial Impression / Assessment and Plan / ED Course  I have reviewed the triage vital signs and the nursing notes.  Pertinent labs & imaging results that were available during my care of the patient were reviewed by me and considered in my medical decision making (see chart for details).   Pt's EKG does show a.fib. Which looks to be new.  The pt is already on Eliquis.  Rate is controlled.  Pt is at baseline mental status.   O2 sat has been normal.  Final Clinical Impressions(s) / ED Diagnoses   Final diagnoses:  Atrial fibrillation, unspecified type [facility])    New Prescriptions New Prescriptions   No medications on file     Isla Pence, MD 05/16/17 1731    Isla Pence, MD 05/16/17 Erskin Burnet    Isla Pence, MD 05/16/17 [phone]

## 2017-05-16 NOTE — ED Notes (Signed)
Unsuccessful with in and out cath, strong resistance. Will place condom cath on patient

## 2017-05-16 NOTE — ED Notes (Signed)
Patient was able to urinate in a urinal. Prefers to not have external catheter placed at this time. RN made aware.

## 2017-05-26 ENCOUNTER — Institutional Professional Consult (permissible substitution): Payer: Medicare Other | Admitting: Internal Medicine

## 2017-05-27 ENCOUNTER — Inpatient Hospital Stay (HOSPITAL_COMMUNITY)
Admission: EM | Admit: 2017-05-27 | Discharge: 2017-05-31 | DRG: 871 | Disposition: A | Discharge status: Home Health | Payer: Medicare Other | Attending: Family Medicine | Admitting: Family Medicine

## 2017-05-27 ENCOUNTER — Emergency Department (HOSPITAL_COMMUNITY): Payer: Medicare Other

## 2017-05-27 ENCOUNTER — Encounter (HOSPITAL_COMMUNITY): Payer: Self-pay | Admitting: *Deleted

## 2017-05-27 DIAGNOSIS — K5900 Constipation, unspecified: Secondary | ICD-10-CM | POA: Diagnosis present

## 2017-05-27 DIAGNOSIS — F0150 Vascular dementia without behavioral disturbance: Secondary | ICD-10-CM | POA: Diagnosis present

## 2017-05-27 DIAGNOSIS — R059 Cough, unspecified: Secondary | ICD-10-CM

## 2017-05-27 DIAGNOSIS — N10 Acute tubulo-interstitial nephritis: Secondary | ICD-10-CM | POA: Diagnosis present

## 2017-05-27 DIAGNOSIS — Z96652 Presence of left artificial knee joint: Secondary | ICD-10-CM | POA: Diagnosis present

## 2017-05-27 DIAGNOSIS — R739 Hyperglycemia, unspecified: Secondary | ICD-10-CM | POA: Diagnosis present

## 2017-05-27 DIAGNOSIS — E871 Hypo-osmolality and hyponatremia: Secondary | ICD-10-CM | POA: Diagnosis present

## 2017-05-27 DIAGNOSIS — Z882 Allergy status to sulfonamides status: Secondary | ICD-10-CM

## 2017-05-27 DIAGNOSIS — I63412 Cerebral infarction due to embolism of left middle cerebral artery: Secondary | ICD-10-CM

## 2017-05-27 DIAGNOSIS — I480 Paroxysmal atrial fibrillation: Secondary | ICD-10-CM | POA: Diagnosis present

## 2017-05-27 DIAGNOSIS — E785 Hyperlipidemia, unspecified: Secondary | ICD-10-CM | POA: Diagnosis present

## 2017-05-27 DIAGNOSIS — Z950 Presence of cardiac pacemaker: Secondary | ICD-10-CM

## 2017-05-27 DIAGNOSIS — M1990 Unspecified osteoarthritis, unspecified site: Secondary | ICD-10-CM | POA: Diagnosis present

## 2017-05-27 DIAGNOSIS — A419 Sepsis, unspecified organism: Principal | ICD-10-CM | POA: Diagnosis present

## 2017-05-27 DIAGNOSIS — R05 Cough: Secondary | ICD-10-CM

## 2017-05-27 DIAGNOSIS — I10 Essential (primary) hypertension: Secondary | ICD-10-CM | POA: Diagnosis present

## 2017-05-27 DIAGNOSIS — K6289 Other specified diseases of anus and rectum: Secondary | ICD-10-CM | POA: Diagnosis present

## 2017-05-27 DIAGNOSIS — E43 Unspecified severe protein-calorie malnutrition: Secondary | ICD-10-CM | POA: Diagnosis present

## 2017-05-27 DIAGNOSIS — G40909 Epilepsy, unspecified, not intractable, without status epilepticus: Secondary | ICD-10-CM | POA: Diagnosis present

## 2017-05-27 DIAGNOSIS — Z6820 Body mass index (BMI) 20.0-20.9, adult: Secondary | ICD-10-CM

## 2017-05-27 DIAGNOSIS — R7989 Other specified abnormal findings of blood chemistry: Secondary | ICD-10-CM | POA: Diagnosis not present

## 2017-05-27 DIAGNOSIS — I639 Cerebral infarction, unspecified: Secondary | ICD-10-CM | POA: Diagnosis present

## 2017-05-27 DIAGNOSIS — I69351 Hemiplegia and hemiparesis following cerebral infarction affecting right dominant side: Secondary | ICD-10-CM

## 2017-05-27 DIAGNOSIS — Z8546 Personal history of malignant neoplasm of prostate: Secondary | ICD-10-CM

## 2017-05-27 DIAGNOSIS — Z823 Family history of stroke: Secondary | ICD-10-CM

## 2017-05-27 DIAGNOSIS — R569 Unspecified convulsions: Secondary | ICD-10-CM

## 2017-05-27 DIAGNOSIS — D696 Thrombocytopenia, unspecified: Secondary | ICD-10-CM | POA: Diagnosis present

## 2017-05-27 DIAGNOSIS — D649 Anemia, unspecified: Secondary | ICD-10-CM | POA: Diagnosis present

## 2017-05-27 DIAGNOSIS — R945 Abnormal results of liver function studies: Secondary | ICD-10-CM

## 2017-05-27 DIAGNOSIS — F0390 Unspecified dementia without behavioral disturbance: Secondary | ICD-10-CM | POA: Diagnosis present

## 2017-05-27 DIAGNOSIS — Z7901 Long term (current) use of anticoagulants: Secondary | ICD-10-CM

## 2017-05-27 DIAGNOSIS — R509 Fever, unspecified: Secondary | ICD-10-CM | POA: Diagnosis not present

## 2017-05-27 DIAGNOSIS — Z66 Do not resuscitate: Secondary | ICD-10-CM | POA: Diagnosis present

## 2017-05-27 LAB — COMPREHENSIVE METABOLIC PANEL
ALBUMIN: 2.1 g/dL — AB (ref 3.5–5.0)
ALK PHOS: 42 U/L (ref 38–126)
ALT: 43 U/L (ref 17–63)
ANION GAP: 8 (ref 5–15)
AST: 155 U/L — ABNORMAL HIGH (ref 15–41)
BUN: 22 mg/dL — ABNORMAL HIGH (ref 6–20)
CO2: 22 mmol/L (ref 22–32)
Calcium: 7.7 mg/dL — ABNORMAL LOW (ref 8.9–10.3)
Chloride: 97 mmol/L — ABNORMAL LOW (ref 101–111)
Creatinine, Ser: 1.1 mg/dL (ref 0.61–1.24)
GFR calc Af Amer: >60 mL/min (ref >60)
GFR calc non Af Amer: >60 mL/min (ref >60)
GLUCOSE: 140 mg/dL — AB (ref 65–99)
POTASSIUM: 4.5 mmol/L (ref 3.5–5.1)
SODIUM: 127 mmol/L — AB (ref 135–145)
Total Bilirubin: 0.8 mg/dL (ref 0.3–1.2)
Total Protein: 6.8 g/dL (ref 6.5–8.1)

## 2017-05-27 LAB — CBC WITH DIFFERENTIAL/PLATELET
BASOS ABS: 0 10*3/uL (ref 0.0–0.1)
BASOS PCT: 0 %
EOS ABS: 0 10*3/uL (ref 0.0–0.7)
Eosinophils Relative: 0 %
HCT: 28.4 % — ABNORMAL LOW (ref 39.0–52.0)
HEMOGLOBIN: 10.2 g/dL — AB (ref 13.0–17.0)
Lymphocytes Relative: 6 %
Lymphs Abs: 0.6 10*3/uL — ABNORMAL LOW (ref 0.7–4.0)
MCH: 30.3 pg (ref 26.0–34.0)
MCHC: 35.9 g/dL (ref 30.0–36.0)
MCV: 84.3 fL (ref 78.0–100.0)
MONOS PCT: 8 %
Monocytes Absolute: 0.8 10*3/uL (ref 0.1–1.0)
NEUTROS PCT: 86 %
Neutro Abs: 8.7 10*3/uL — ABNORMAL HIGH (ref 1.7–7.7)
Platelets: 135 10*3/uL — ABNORMAL LOW (ref 150–400)
RBC: 3.37 MIL/uL — ABNORMAL LOW (ref 4.22–5.81)
RDW: 14.7 % (ref 11.5–15.5)
WBC: 10.1 10*3/uL (ref 4.0–10.5)

## 2017-05-27 LAB — LACTIC ACID, PLASMA: LACTIC ACID, VENOUS: 1.3 mmol/L (ref 0.5–1.9)

## 2017-05-27 LAB — URINALYSIS, ROUTINE W REFLEX MICROSCOPIC
BILIRUBIN URINE: NEGATIVE
Glucose, UA: NEGATIVE mg/dL
Ketones, ur: NEGATIVE mg/dL
LEUKOCYTES UA: NEGATIVE
Nitrite: NEGATIVE
PROTEIN: 100 mg/dL — AB
Specific Gravity, Urine: 1.018 (ref 1.005–1.030)
pH: 5 (ref 5.0–8.0)

## 2017-05-27 LAB — APTT: APTT: 36 s (ref 24–36)

## 2017-05-27 LAB — PROCALCITONIN: Procalcitonin: 19.81 ng/mL

## 2017-05-27 LAB — INFLUENZA PANEL BY PCR (TYPE A & B)
INFLAPCR: NEGATIVE
INFLBPCR: NEGATIVE

## 2017-05-27 LAB — PROTIME-INR
INR: 1.8
PROTHROMBIN TIME: 20.7 s — AB (ref 11.4–15.2)

## 2017-05-27 LAB — I-STAT CG4 LACTIC ACID, ED
LACTIC ACID, VENOUS: 3.94 mmol/L — AB (ref 0.5–1.9)
LACTIC ACID, VENOUS: 1.16 mmol/L (ref 0.5–1.9)

## 2017-05-27 MED ORDER — SODIUM CHLORIDE 0.9 % IV SOLN
1250.0000 mg | Freq: Once | INTRAVENOUS | Status: AC
  Administered 2017-05-27: 1250 mg via INTRAVENOUS
  Filled 2017-05-27: qty 1250

## 2017-05-27 MED ORDER — LACTATED RINGERS IV SOLN
INTRAVENOUS | Status: DC
  Administered 2017-05-27 – 2017-05-29 (×5): via INTRAVENOUS

## 2017-05-27 MED ORDER — SODIUM CHLORIDE 0.9 % IV BOLUS (SEPSIS): 1000.0000 mL | Freq: Once | INTRAVENOUS | Status: DC

## 2017-05-27 MED ORDER — MEMANTINE HCL 10 MG PO TABS
5.0000 mg | ORAL_TABLET | Freq: Two times a day (BID) | ORAL | Status: DC
  Administered 2017-05-28 – 2017-05-31 (×6): 5 mg via ORAL
  Filled 2017-05-27 (×6): qty 1

## 2017-05-27 MED ORDER — ACETAMINOPHEN 650 MG RE SUPP
650.0000 mg | Freq: Four times a day (QID) | RECTAL | Status: DC | PRN
  Administered 2017-05-27: 650 mg via RECTAL
  Filled 2017-05-27: qty 1

## 2017-05-27 MED ORDER — ACETAMINOPHEN 325 MG PO TABS: 325.0000 mg | ORAL_TABLET | Freq: Once | ORAL | Status: DC

## 2017-05-27 MED ORDER — ACETAMINOPHEN 500 MG PO TABS: 1000.0000 mg | ORAL_TABLET | Freq: Once | ORAL | Status: DC

## 2017-05-27 MED ORDER — ONDANSETRON HCL 4 MG/2ML IJ SOLN: 4.0000 mg | Freq: Four times a day (QID) | INTRAMUSCULAR | Status: DC | PRN

## 2017-05-27 MED ORDER — AMLODIPINE BESYLATE 5 MG PO TABS
2.5000 mg | ORAL_TABLET | Freq: Every day | ORAL | Status: DC
  Administered 2017-05-29 – 2017-05-31 (×3): 2.5 mg via ORAL
  Filled 2017-05-27 (×3): qty 1

## 2017-05-27 MED ORDER — SODIUM CHLORIDE 0.9 % IV BOLUS (SEPSIS)
1000.0000 mL | Freq: Once | INTRAVENOUS | Status: AC
  Administered 2017-05-27: 1000 mL via INTRAVENOUS

## 2017-05-27 MED ORDER — DOCUSATE SODIUM 100 MG PO CAPS
100.0000 mg | ORAL_CAPSULE | Freq: Two times a day (BID) | ORAL | Status: DC
  Administered 2017-05-28 – 2017-05-31 (×5): 100 mg via ORAL
  Filled 2017-05-27 (×5): qty 1

## 2017-05-27 MED ORDER — LEVETIRACETAM 500 MG PO TABS
500.0000 mg | ORAL_TABLET | Freq: Two times a day (BID) | ORAL | Status: DC
  Administered 2017-05-28 – 2017-05-31 (×6): 500 mg via ORAL
  Filled 2017-05-27 (×6): qty 1

## 2017-05-27 MED ORDER — PIPERACILLIN-TAZOBACTAM 3.375 G IVPB
3.3750 g | Freq: Three times a day (TID) | INTRAVENOUS | Status: AC
  Administered 2017-05-27 – 2017-05-30 (×8): 3.375 g via INTRAVENOUS
  Filled 2017-05-27 (×8): qty 50

## 2017-05-27 MED ORDER — VANCOMYCIN HCL IN DEXTROSE 1-5 GM/200ML-% IV SOLN
1000.0000 mg | INTRAVENOUS | Status: DC
  Administered 2017-05-28: 1000 mg via INTRAVENOUS
  Filled 2017-05-27: qty 200

## 2017-05-27 MED ORDER — APIXABAN 5 MG PO TABS
5.0000 mg | ORAL_TABLET | Freq: Two times a day (BID) | ORAL | Status: DC
  Administered 2017-05-28 – 2017-05-31 (×6): 5 mg via ORAL
  Filled 2017-05-27 (×6): qty 1

## 2017-05-27 MED ORDER — PIPERACILLIN-TAZOBACTAM 3.375 G IVPB 30 MIN
3.3750 g | Freq: Once | INTRAVENOUS | Status: AC
  Administered 2017-05-27: 3.375 g via INTRAVENOUS
  Filled 2017-05-27: qty 50

## 2017-05-27 MED ORDER — ENSURE ENLIVE PO LIQD
237.0000 mL | Freq: Two times a day (BID) | ORAL | Status: DC
  Administered 2017-05-29 – 2017-05-31 (×4): 237 mL via ORAL

## 2017-05-27 MED ORDER — ONDANSETRON HCL 4 MG PO TABS: 4.0000 mg | ORAL_TABLET | Freq: Four times a day (QID) | ORAL | Status: DC | PRN

## 2017-05-27 MED ORDER — ACETAMINOPHEN 325 MG PO TABS
650.0000 mg | ORAL_TABLET | Freq: Four times a day (QID) | ORAL | Status: DC | PRN
  Administered 2017-05-28 – 2017-05-29 (×2): 650 mg via ORAL
  Filled 2017-05-27 (×2): qty 2

## 2017-05-27 MED ORDER — VANCOMYCIN HCL IN DEXTROSE 1-5 GM/200ML-% IV SOLN: 1000.0000 mg | Freq: Once | INTRAVENOUS | Status: DC

## 2017-05-27 MED ORDER — SODIUM CHLORIDE 0.9 % IV BOLUS (SEPSIS)
500.0000 mL | Freq: Once | INTRAVENOUS | Status: AC
  Administered 2017-05-27: 500 mL via INTRAVENOUS

## 2017-05-27 MED ORDER — ACETAMINOPHEN 650 MG RE SUPP
RECTAL | Status: AC
  Administered 2017-05-27: 15:00:00
  Filled 2017-05-27: qty 1

## 2017-05-27 MED ORDER — SODIUM CHLORIDE 0.9 % IV BOLUS (SEPSIS)
250.0000 mL | Freq: Once | INTRAVENOUS | Status: AC
  Administered 2017-05-27: 250 mL via INTRAVENOUS

## 2017-05-27 MED ORDER — ACETAMINOPHEN 325 MG RE SUPP
325.0000 mg | Freq: Once | RECTAL | Status: AC
Start: 2017-05-27 — End: 2017-05-27
  Administered 2017-05-27: 325 mg via RECTAL
  Filled 2017-05-27: qty 1

## 2017-05-27 MED ORDER — MIRABEGRON ER 25 MG PO TB24
25.0000 mg | ORAL_TABLET | Freq: Every day | ORAL | Status: DC
  Administered 2017-05-29 – 2017-05-31 (×3): 25 mg via ORAL
  Filled 2017-05-27 (×3): qty 1

## 2017-05-27 MED ORDER — ATORVASTATIN CALCIUM 20 MG PO TABS
20.0000 mg | ORAL_TABLET | Freq: Every day | ORAL | Status: DC
  Administered 2017-05-28 – 2017-05-30 (×3): 20 mg via ORAL
  Filled 2017-05-27 (×3): qty 1

## 2017-05-27 MED ORDER — ACETAMINOPHEN 650 MG RE SUPP
RECTAL | Status: AC
  Administered 2017-05-27: 350 mg
  Filled 2017-05-27: qty 1

## 2017-05-27 MED ORDER — DONEPEZIL HCL 5 MG PO TABS
10.0000 mg | ORAL_TABLET | Freq: Every morning | ORAL | Status: DC
  Administered 2017-05-29 – 2017-05-31 (×3): 10 mg via ORAL
  Filled 2017-05-27 (×3): qty 2

## 2017-05-27 NOTE — ED Notes (Signed)
Have attached a condom cath to pt and have checked for urine, but no urine produced yet.

## 2017-05-27 NOTE — Progress Notes (Signed)
Pt rectal temp remains high following tylenol suppository, MD aware, will continue to monitor.

## 2017-05-27 NOTE — ED Notes (Signed)
Unsuccessful I&O cath x2. Pt has an enlarged prostate per family

## 2017-05-27 NOTE — ED Notes (Signed)
Unsuccessful I&O cath x2. Will attach a condom cath to get urine

## 2017-05-27 NOTE — ED Provider Notes (Signed)
[facility] DEPT Provider Note   CSN: [id number] Arrival date & time: 05/27/17  1412   Level 5 caveat, altered mental status  History   Chief Complaint Chief Complaint  Patient presents with  . Fever    HPI Kenneth Vazquez is a 76 y.o. male.  HPI Patient presents to the emergency room for evaluation of fever and lethargy. Patient has a history of dementia as well as multiple strokes.  Family states that normally he will look at you when you speak to him. He may say a few words generally does not speak. Today they noticed that he was more sleepy than usual.he also seemed to be trembling.They took his temperature and he had a fever.they called 911 to bring him into the emergency room for further evaluation.  The patient is not able to speak to me and provide any further history. Past Medical History:  Diagnosis Date  . Arthritis   . Carotid artery disease ([place])   . Dementia   . Hypertension   . Pacemaker   . Prostate cancer ([place])   . Seizures ([place])    last seizure was a couple of weeks ago  . Stroke [facility]) x2, 2015   residual weakness, wife is unsure which side    Patient Active Problem List   Diagnosis Date Noted  . Paroxysmal atrial fibrillation ([place]) 05/12/2017  . Pacemaker 05/12/2017  . Malnutrition of moderate degree 04/02/2017  . CVA (cerebral vascular accident) ([place]) 03/31/2017  . Essential hypertension 03/31/2017  . Dementia 03/31/2017  . Severe protein-calorie malnutrition ([place]) 03/31/2017  . Normocytic anemia 03/31/2017  . Hyperlipidemia 03/31/2017  . Thrombocytopenia ([place]) 03/31/2017  . Seizures ([place]) 11/27/2016    Past Surgical History:  Procedure Laterality Date  . CAROTID STENT    . left total knee replacement    . PACEMAKER INSERTION    . PROSTATE CRYOABLATION         Home Medications    Prior to Admission medications   Medication Sig Start Date End Date Taking? Authorizing Provider  amLODipine (NORVASC) 2.5 MG tablet Take 1 tablet (2.5  mg total) by mouth daily. 04/08/17  Yes Hosie Poisson, MD  apixaban (ELIQUIS) 5 MG TABS tablet Take 1 tablet (5 mg total) by mouth 2 (two) times daily. 05/12/17  Yes Nahser, Wonda Cheng, MD  atorvastatin (LIPITOR) 20 MG tablet Take 1 tablet (20 mg total) by mouth daily at 6 PM. Patient taking differently: Take 20 mg by mouth every morning.  04/07/17  Yes Hosie Poisson, MD  docusate sodium (COLACE) 100 MG capsule Take 100 mg by mouth daily as needed for mild constipation or moderate constipation.    Yes [provider]  donepezil (ARICEPT) 10 MG tablet Take 1 tablet (10 mg total) by mouth every morning. 11/30/16  Yes Hosie Poisson, MD  feeding supplement, ENSURE ENLIVE, (ENSURE ENLIVE) LIQD Take 237 mLs by mouth 2 (two) times daily between meals. Patient taking differently: Take 237 mLs by mouth 2 (two) times daily.  04/07/17  Yes Hosie Poisson, MD  furosemide (LASIX) 20 MG tablet Take 20 mg by mouth daily.   Yes [provider]  levETIRAcetam (KEPPRA) 500 MG tablet Take 500 mg by mouth 2 (two) times daily.   Yes [provider]  memantine (NAMENDA) 5 MG tablet Take 5 mg by mouth 2 (two) times daily. 04/15/17 04/15/18 Yes [provider]  MYRBETRIQ 25 MG TB24 tablet Take 1 tablet by mouth daily. 11/13/16  Yes [provider]  traZODone (DESYREL) 50 MG tablet Take 50 mg by mouth 2 (two) times daily.    Yes [provider]    Family History Family History  Problem Relation Age of Onset  . CVA Mother     Social History Social History  Substance Use Topics  . Smoking status: Never Smoker  . Smokeless tobacco: Never Used  . Alcohol use No     Comment: h/o heavy use, no use in recent years     Allergies   Sulfa antibiotics and Sulfasalazine   Review of Systems Review of Systems  Unable to perform ROS: Mental status change     Physical Exam Updated Vital Signs BP (!) 95/45   Pulse 95   Temp (!) 102.4 F (39.1 C) (Rectal)   Resp 20   SpO2  100%   Physical Exam  Constitutional: No distress.  Elderly, frail  HENT:  Head: Normocephalic and atraumatic.  Right Ear: External ear normal.  Left Ear: External ear normal.  Eyes: Conjunctivae are normal. Right eye exhibits no discharge. Left eye exhibits no discharge. No scleral icterus.  Neck: Neck supple. No tracheal deviation present.  Cardiovascular: Regular rhythm and intact distal pulses.  Tachycardia present.   Pulmonary/Chest: Effort normal and breath sounds normal. No stridor. No respiratory distress. He has no wheezes. He has no rales.  Abdominal: Soft. Bowel sounds are normal. He exhibits no distension. There is no tenderness. There is no rebound and no guarding.  Genitourinary:  Genitourinary Comments: No decubitus ulcer noted on back or sacrum  Musculoskeletal: He exhibits no edema or tenderness.  Neurological: He is alert. He has normal strength. No cranial nerve deficit (no facial droop, extraocular movements intact, no slurred speech) or sensory deficit. He exhibits normal muscle tone. He displays no seizure activity. Coordination normal.  Skin: Skin is warm and dry. No rash noted.  Psychiatric: His affect is blunt. He is noncommunicative.  Nursing note and vitals reviewed.    ED Treatments / Results  Labs (all labs ordered are listed, but only abnormal results are displayed) Labs Reviewed  CBC WITH DIFFERENTIAL/PLATELET - Abnormal; Notable for the following:       Result Value   RBC 3.37 (*)    Hemoglobin 10.2 (*)    HCT 28.4 (*)    Platelets 135 (*)    Neutro Abs 8.7 (*)    Lymphs Abs 0.6 (*)    All other components within normal limits  COMPREHENSIVE METABOLIC PANEL - Abnormal; Notable for the following:    Sodium 127 (*)    Chloride 97 (*)    Glucose, Bld 140 (*)    BUN 22 (*)    Calcium 7.7 (*)    Albumin 2.1 (*)    AST 155 (*)    All other components within normal limits  URINALYSIS, ROUTINE W REFLEX MICROSCOPIC - Abnormal; Notable for the  following:    APPearance CLOUDY (*)    Hgb urine dipstick LARGE (*)    Protein, ur 100 (*)    Bacteria, UA RARE (*)    Squamous Epithelial / LPF 0-5 (*)    All other components within normal limits  I-STAT CG4 LACTIC ACID, ED - Abnormal; Notable for the following:    Lactic Acid, Venous 3.94 (*)    All other components within normal limits  CULTURE, BLOOD (ROUTINE X 2)  CULTURE, BLOOD (ROUTINE X 2)  URINE CULTURE  INFLUENZA PANEL BY PCR (TYPE A & B)  I-STAT CG4 LACTIC ACID,  ED  I-STAT CG4 LACTIC ACID, ED    Radiology Dg Chest Port 1 View  Result Date: 05/27/2017 CLINICAL DATA:  Cough and congestion with fever EXAM: PORTABLE CHEST 1 VIEW COMPARISON:  May 16, 2017 FINDINGS: There is no edema or consolidation. Heart is enlarged with pulmonary vascularity within normal limits. Pacemaker leads are attached to the right atrium and right ventricle. There is aortic atherosclerosis. No adenopathy. No evident bone lesions. IMPRESSION: Stable cardiomegaly. Pacemaker leads attached to right atrium and right ventricle. No edema or consolidation. There is aortic atherosclerosis. Aortic Atherosclerosis (ICD10-I70.0). Electronically Signed   By: Lowella Grip III M.D.   On: 05/27/2017 15:02    Procedures .Critical Care Performed by: Dorie Rank Authorized by: Dorie Rank   Critical care provider statement:    Critical care time (minutes):  35   Critical care was time spent personally by me on the following activities:  Discussions with consultants, evaluation of patient's response to treatment, examination of patient, ordering and performing treatments and interventions, ordering and review of laboratory studies, ordering and review of radiographic studies, pulse oximetry, re-evaluation of patient's condition, obtaining history from patient or surrogate and review of old charts   (including critical care time)  Medications Ordered in ED Medications  acetaminophen (TYLENOL) tablet 1,000  mg (1,000 mg Oral Not Given 05/27/17 1510)  acetaminophen (TYLENOL) 650 MG suppository (  Given 05/27/17 1513)  acetaminophen (TYLENOL) 650 MG suppository (350 mg  Given 05/27/17 1513)  sodium chloride 0.9 % bolus 1,000 mL (0 mLs Intravenous Stopped 05/27/17 1513)    And  sodium chloride 0.9 % bolus 1,000 mL (0 mLs Intravenous Stopped 05/27/17 1513)    And  sodium chloride 0.9 % bolus 250 mL (0 mLs Intravenous Stopped 05/27/17 1624)  piperacillin-tazobactam (ZOSYN) IVPB 3.375 g (0 g Intravenous Stopped 05/27/17 1546)  vancomycin (VANCOCIN) 1,250 mg in sodium chloride 0.9 % 250 mL IVPB (0 mg Intravenous Stopped 05/27/17 1710)     Initial Impression / Assessment and Plan / ED Course  I have reviewed the triage vital signs and the nursing notes.  Pertinent labs & imaging results that were available during my care of the patient were reviewed by me and considered in my medical decision making (see chart for details).  Clinical Course as of May 27 1853  Tue May 27, 2017  1701 Anemia is stable.  Hyponatremia is new.  Lactic acid is elevated.  Bp has remained stable.  Doubt severe sepsis  [JK]  1853 Most recent BP is low 100s.  Lactic acid level improving per report.  Updated family.  Will arrange for admission  [JK]    Clinical Course User Index [JK] Dorie Rank, MD   Patient presented to the emergency room with a fever and cough. Patient has history of dementia andrecent hospitalization. Temperature was as high as 104.5.initial blood pressure was in the low 100s. Patient was treated with IV fluid boluses and antibiotics.  Plan on admission for further treatment. Final Clinical Impressions(s) / ED Diagnoses   Final diagnoses:  Cough  Hyponatremia  Acute pyelonephritis      Dorie Rank, MD 05/27/17 [phone]

## 2017-05-27 NOTE — ED Notes (Signed)
Istat Lactic 1.16, but has not crossed over from machine yet

## 2017-05-27 NOTE — Progress Notes (Signed)
Pharmacy Antibiotic Note  Kenneth Vazquez is a 76 y.o. male admitted on 05/27/2017 with sepsis.  Pharmacy has been consulted for vancomycin and zosyn dosing.Zosyn 3.375 gm IV ordered in the ED  Plan: Zosyn 3.375g IV q8h (4 hour infusion). Change vanc 1gm ordered to 1250 mg IV X 1 then 1gm IV q24 hours F/u renal function, cultures and clinical course    Temp (24hrs), Avg:104.2 F (40.1 C), Min:104.2 F (40.1 C), Max:104.2 F (40.1 C)  No results for input(s): WBC, CREATININE, LATICACIDVEN, VANCOTROUGH, VANCOPEAK, VANCORANDOM, GENTTROUGH, GENTPEAK, GENTRANDOM, TOBRATROUGH, TOBRAPEAK, TOBRARND, AMIKACINPEAK, AMIKACINTROU, AMIKACIN in the last 168 hours.  Estimated Creatinine Clearance: 57.2 mL/min (by C-G formula based on SCr of 1.04 mg/dL).    Allergies  Allergen Reactions  . Sulfa Antibiotics Hives  . Sulfasalazine Hives    Antimicrobials this admission: vanc 9/25 >>  zosyn 9/25 >>   Thank you for allowing pharmacy to be a part of this patient's care.  Beverlee Nims 05/27/2017 2:51 PM

## 2017-05-27 NOTE — ED Triage Notes (Signed)
Pt comes from home by EMS. He was recently at stroke patient. Today he comes in for shaking. Per EMS, pt has 104.5 fever. Pt is alert to voice. His right side has decreased strength, which is normal.

## 2017-05-27 NOTE — H&P (Signed)
History and Physical    Kenneth Vazquez OXB:[id number] DOB: 10/09/1940 DOA: 05/27/2017  PCP: Jodi Marble, MD Consultants:  Darrow Bussing - cardiology; Manuella Ghazi - neurology; Remer Macho - rheumatology; Terance Hart - urology Patient coming from:  Home - lives with wife; previously he was living at [facility] for rehab and discharged last Friday; NOK: wife, [phone]  Chief Complaint: fever  HPI: Kenneth Vazquez is a 76 y.o. 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  They were trying to treat with Tylenol.  But then he wasn't acting right so they called 911.  He was discharged from [facility] on Friday and did okay over the weekend.  His wife called the doctor yesterday to ask if he could be seen because she had noticed a subjective fever.  He had an appointment for today but he didn't make it to that appointment.  Fever started on Sunday, subjective; subjectively higher today.  She felt like he wasn't responding like he usually doses today.  Recent CVA, has R hemiparesis.  He was hospitalized from 7/30-8/6 and discharged to SNF rehab.  ED Course:  Tmax 104.5, BP low 100s.  Given IVF boluses and Vanc/Zosyn with Code Sepsis called.  Review of Systems: Unable to perform  Ambulatory Status: Non-ambulatory  Past Medical History:  Diagnosis Date  . Arthritis   . Carotid artery disease ([place])   . Dementia   . Hypertension   . Pacemaker   . Prostate cancer ([place])   . Seizures ([place])    last seizure was a while ago  . Stroke [facility]) x2, 2015   residual weakness, wife is unsure which side    Past Surgical History:  Procedure Laterality Date  . CAROTID STENT    . left total knee replacement    . PACEMAKER INSERTION    . PROSTATE CRYOABLATION      Social History   Social History  . Marital status: Married    Spouse name: N/A  . Number of children: N/A  .  Years of education: N/A   Occupational History  . Not on file.   Social History Main Topics  . Smoking status: Never Smoker  . Smokeless tobacco: Never Used  . Alcohol use No     Comment: h/o heavy use, no use in recent years  . Drug use: No  . Sexual activity: Not on file   Other Topics Concern  . Not on file   Social History Narrative  . No narrative on file    Allergies  Allergen Reactions  . Sulfa Antibiotics Hives  . Sulfasalazine Hives    Family History  Problem Relation Age of Onset  . CVA Mother     Prior to Admission medications   Medication Sig Start Date End Date Taking? Authorizing Provider  amLODipine (NORVASC) 2.5 MG tablet Take 1 tablet (2.5 mg total) by mouth daily. 04/08/17  Yes Hosie Poisson, MD  apixaban (ELIQUIS) 5 MG TABS tablet Take 1 tablet (5 mg total) by mouth 2 (two) times daily. 05/12/17  Yes Nahser, Wonda Cheng, MD  atorvastatin (LIPITOR) 20 MG tablet Take 1 tablet (20 mg total) by mouth daily at 6 PM. Patient taking differently: Take 20 mg by mouth every morning.  04/07/17  Yes Hosie Poisson, MD  docusate sodium (COLACE) 100 MG capsule Take 100 mg by mouth daily as needed  for mild constipation or moderate constipation.    Yes [provider]  donepezil (ARICEPT) 10 MG tablet Take 1 tablet (10 mg total) by mouth every morning. 11/30/16  Yes Hosie Poisson, MD  feeding supplement, ENSURE ENLIVE, (ENSURE ENLIVE) LIQD Take 237 mLs by mouth 2 (two) times daily between meals. Patient taking differently: Take 237 mLs by mouth 2 (two) times daily.  04/07/17  Yes Hosie Poisson, MD  furosemide (LASIX) 20 MG tablet Take 20 mg by mouth daily.   Yes [provider]  levETIRAcetam (KEPPRA) 500 MG tablet Take 500 mg by mouth 2 (two) times daily.   Yes [provider]  memantine (NAMENDA) 5 MG tablet Take 5 mg by mouth 2 (two) times daily. 04/15/17 04/15/18 Yes [provider]  MYRBETRIQ 25 MG TB24 tablet Take 1 tablet by mouth daily.  11/13/16  Yes [provider]  traZODone (DESYREL) 50 MG tablet Take 50 mg by mouth 2 (two) times daily.    Yes [provider]    Physical Exam: Vitals:   05/27/17 1800 05/27/17 1900 05/27/17 1941 05/27/17 1948  BP: (!) 95/45 107/68 123/77   Pulse: 95 99 95   Resp: 20 20 19    Temp:    (!) 101.7 F (38.7 C)  TempSrc:    Rectal  SpO2: 100% 100% 100%      General: Obtunded, minimally responsive to touch Eyes:  PERRL, EOMI, normal lids, iris ENT: normal lips & tongue, mmm Neck:  no LAD, masses or thyromegaly; no carotid bruits Cardiovascular:  RRR, no m/r/g. No LE edema.  Respiratory:   CTA bilaterally with no wheezes/rales/rhonchi.  Normal respiratory effort. Abdomen:  soft, NT, ND, NABS Skin:  no rash or induration seen on limited exam Musculoskeletal:  no bony abnormality Psychiatric: Obtunded, minimally responsive to touch Neurologic: Unable to assess    Radiological Exams on Admission: Dg Chest Port 1 View  Result Date: 05/27/2017 CLINICAL DATA:  Cough and congestion with fever EXAM: PORTABLE CHEST 1 VIEW COMPARISON:  May 16, 2017 FINDINGS: There is no edema or consolidation. Heart is enlarged with pulmonary vascularity within normal limits. Pacemaker leads are attached to the right atrium and right ventricle. There is aortic atherosclerosis. No adenopathy. No evident bone lesions. IMPRESSION: Stable cardiomegaly. Pacemaker leads attached to right atrium and right ventricle. No edema or consolidation. There is aortic atherosclerosis. Aortic Atherosclerosis (ICD10-I70.0). Electronically Signed   By: Lowella Grip III M.D.   On: 05/27/2017 15:02    EKG: not done   Labs on Admission: I have personally reviewed the available labs and imaging studies at the time of the admission.  Pertinent labs:   UA: cloudy; large Hgb; negative nitrite and LE; 100 protein; rare bacteria; 6-30 WBC Flu negative Lactate 3.94, repeat 1.16 Na++ 127, prior  137 Glucose 140 BUN 22/Creatinine 1.10/GFR >60  Albumin 2.1 AST 155/ALT 43 - previously normal on 9/14 Hgb 10.2 Platelets 135 - stable  Assessment/Plan Principal Problem:   Sepsis ([place]) Active Problems:   Seizures (HCC)   CVA (cerebral vascular accident) ([place])   Essential hypertension   Dementia   Severe protein-calorie malnutrition (HCC)   Normocytic anemia   Hyperlipidemia   Thrombocytopenia (HCC)   Paroxysmal atrial fibrillation (HCC)   Pacemaker   Hyperglycemia   Elevated LFTs   Sepsis -Fever, tachycardia with elevated lactate to 3.94 (repeat 1.16)  -While awaiting blood cultures, this appears to be a preseptic condition. 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 -He was able to clearly state his code status at the time of his last admission with me (7/30) - "I want to die naturally" -Uncertain current baseline but he is obtunded now -Continue Aricept and Namenda once my alert and able to safely swallow  Seizure d/o -Patient has occasional breakthrough seizures -Continue Keppra  Malnutrition -Albumin 2.1 -He qualifies for Hospice by this value alone -Consider palliative care consult  Anemia -Stable, will follow  Thrombocytopenia -Platelets stable, will follow -Will need to hold Lovenox for platelets <100 -Avoid NSAIDs, if possible  Afib -Has pacemaker, rate  controlled -Continue Eliquis  Elevated LFTs -Uncertain etiology, possibly medication related -Could also be related to shock liver -Will check hepatitis panel -Recheck CMP in AM   DVT prophylaxis: Lovenox Code Status: DNR - confirmed with patient (previously)/family (today) Family Communication: Wife and daughter present throughout evaluation Disposition Plan:  Home once clinically improved Consults called: None  Admission status: Admit - It is my clinical opinion that admission to INPATIENT is reasonable and necessary because this patient will require at least 2 midnights in the hospital to treat this condition based on the medical complexity of the problems presented.  Given the aforementioned information, the predictability of an adverse outcome is felt to be significant.    Karmen Bongo MD [facility]  If note is complete, please contact covering daytime or nighttime physician. [email or web address] Password [email or web address]  05/27/2017, 9:02 PM

## 2017-05-27 NOTE — ED Notes (Signed)
Pt is not very responsive to voice, but is responsive to painful stimuli

## 2017-05-28 ENCOUNTER — Inpatient Hospital Stay (HOSPITAL_COMMUNITY): Payer: Medicare Other

## 2017-05-28 ENCOUNTER — Encounter: Payer: Self-pay | Admitting: Internal Medicine

## 2017-05-28 DIAGNOSIS — E785 Hyperlipidemia, unspecified: Secondary | ICD-10-CM

## 2017-05-28 DIAGNOSIS — A419 Sepsis, unspecified organism: Principal | ICD-10-CM

## 2017-05-28 DIAGNOSIS — D696 Thrombocytopenia, unspecified: Secondary | ICD-10-CM

## 2017-05-28 DIAGNOSIS — E43 Unspecified severe protein-calorie malnutrition: Secondary | ICD-10-CM

## 2017-05-28 DIAGNOSIS — D649 Anemia, unspecified: Secondary | ICD-10-CM

## 2017-05-28 DIAGNOSIS — F0150 Vascular dementia without behavioral disturbance: Secondary | ICD-10-CM

## 2017-05-28 DIAGNOSIS — I10 Essential (primary) hypertension: Secondary | ICD-10-CM

## 2017-05-28 DIAGNOSIS — I480 Paroxysmal atrial fibrillation: Secondary | ICD-10-CM

## 2017-05-28 DIAGNOSIS — R569 Unspecified convulsions: Secondary | ICD-10-CM

## 2017-05-28 DIAGNOSIS — R739 Hyperglycemia, unspecified: Secondary | ICD-10-CM

## 2017-05-28 DIAGNOSIS — R7989 Other specified abnormal findings of blood chemistry: Secondary | ICD-10-CM

## 2017-05-28 LAB — BASIC METABOLIC PANEL
Anion gap: 8 (ref 5–15)
BUN: 23 mg/dL — ABNORMAL HIGH (ref 6–20)
CALCIUM: 7.3 mg/dL — AB (ref 8.9–10.3)
CO2: 21 mmol/L — AB (ref 22–32)
CREATININE: 1.02 mg/dL (ref 0.61–1.24)
Chloride: 102 mmol/L (ref 101–111)
GFR calc non Af Amer: >60 mL/min (ref >60)
GLUCOSE: 102 mg/dL — AB (ref 65–99)
Potassium: 3.9 mmol/L (ref 3.5–5.1)
Sodium: 131 mmol/L — ABNORMAL LOW (ref 135–145)

## 2017-05-28 LAB — HEPATIC FUNCTION PANEL
ALT: 40 U/L (ref 17–63)
AST: 139 U/L — ABNORMAL HIGH (ref 15–41)
Albumin: 1.7 g/dL — ABNORMAL LOW (ref 3.5–5.0)
Alkaline Phosphatase: 34 U/L — ABNORMAL LOW (ref 38–126)
BILIRUBIN DIRECT: 0.3 mg/dL (ref 0.1–0.5)
BILIRUBIN INDIRECT: 0.5 mg/dL (ref 0.3–0.9)
Total Bilirubin: 0.8 mg/dL (ref 0.3–1.2)
Total Protein: 5.6 g/dL — ABNORMAL LOW (ref 6.5–8.1)

## 2017-05-28 LAB — CBC
HCT: 25.7 % — ABNORMAL LOW (ref 39.0–52.0)
Hemoglobin: 8.9 g/dL — ABNORMAL LOW (ref 13.0–17.0)
MCH: 29.3 pg (ref 26.0–34.0)
MCHC: 34.6 g/dL (ref 30.0–36.0)
MCV: 84.5 fL (ref 78.0–100.0)
PLATELETS: 125 10*3/uL — AB (ref 150–400)
RBC: 3.04 MIL/uL — AB (ref 4.22–5.81)
RDW: 13.9 % (ref 11.5–15.5)
WBC: 9.8 10*3/uL (ref 4.0–10.5)

## 2017-05-28 LAB — MRSA PCR SCREENING: MRSA by PCR: NEGATIVE

## 2017-05-28 LAB — LACTIC ACID, PLASMA: Lactic Acid, Venous: 1.7 mmol/L (ref 0.5–1.9)

## 2017-05-28 MED ORDER — KETOROLAC TROMETHAMINE 15 MG/ML IJ SOLN
15.0000 mg | Freq: Once | INTRAMUSCULAR | Status: AC
  Administered 2017-05-28: 15 mg via INTRAVENOUS
  Filled 2017-05-28: qty 1

## 2017-05-28 NOTE — Progress Notes (Signed)
PROGRESS NOTE    Kenneth Vazquez  TFT:[id number]  DOB: May 07, 1941  DOA: 05/27/2017 PCP: Jodi Marble, MD   Brief Admission Hx: Kenneth Vazquez is a 76 y.o. 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   MDM/Assessment & Plan:  1. Sepsis from urinary source - Continue current management with IV antibiotics and supportive care.  Lactic acid has trended down to normal range.  Follow clinically.  2. Recent CVA with residual right hemiparesis - Pt completed rehab and was sent home, get PT eval while in hospital.   3. Essential Hypertension - stable on amlodipine daily.  4. Dyslipidemia - resume home lipitor daily.  5. Dementia - likely vascular, stable.  6. Malnutrition - Dietitian consult.   7. Thrombocytopenia - stable, following on lovenox.  8. Seizure disorder - resumed home keppra, reported to have occasional breakthrough seizures.  9. Anemia - Following.  10. Controlled Afib - resume home eliquis for anticoagulation, rate is controlled has pacemaker. 11. Elevated LFTs - hepatitis panel pending.  Following.   DVT prophylaxis: Lovenox Code Status: DNR Family Communication: Wife and daughter  Disposition Plan:  Home once clinically improved  Subjective: No acute changes overnight, still having fever  Objective: Vitals:   05/28/17 0141 05/28/17 0326 05/28/17 0502 05/28/17 0523  BP:   107/69   Pulse:   90   Resp:   16   Temp: (!) 101.7 F (38.7 C) (!) 102.1 F (38.9 C)  (!) 100.5 F (38.1 C)  TempSrc: Rectal Rectal  Rectal  SpO2:   99%   Weight:        Intake/Output Summary (Last 24 hours) at 05/28/17 0941 Last data filed at 05/28/17 7062  Gross per 24 hour  Intake           731.67 ml  Output              550 ml  Net           181.67 ml   Filed Weights   05/27/17 2123  Weight: 70 kg (154 lb 4.8 oz)    REVIEW OF  SYSTEMS  As per history otherwise all reviewed and reported negative  Exam:  General exam: demented elderly male, NAD.  Respiratory system: Clear. No increased work of breathing. Cardiovascular system: S1 & S2 heard Gastrointestinal system: Abdomen is nondistended, soft and nontender. Normal bowel sounds heard. Central nervous system: easily arousable, but drifts in/out of sleep Extremities: no CCE.  Data Reviewed: Basic Metabolic Panel:  Recent Labs Lab 05/27/17 1432 05/28/17 0503  NA 127* 131*  K 4.5 3.9  CL 97* 102  CO2 22 21*  GLUCOSE 140* 102*  BUN 22* 23*  CREATININE 1.10 1.02  CALCIUM 7.7* 7.3*   Liver Function Tests:  Recent Labs Lab 05/27/17 1432 05/28/17 0503  AST 155* 139*  ALT 43 40  ALKPHOS 42 34*  BILITOT 0.8 0.8  PROT 6.8 5.6*  ALBUMIN 2.1* 1.7*   No results for input(s): LIPASE, AMYLASE in the last 168 hours. No results for input(s): AMMONIA in the last 168 hours. CBC:  Recent Labs Lab 05/27/17 1432 05/28/17 0503  WBC 10.1 9.8  NEUTROABS 8.7*  --   HGB 10.2* 8.9*  HCT 28.4* 25.7*  MCV 84.3 84.5  PLT 135* 125*   Cardiac Enzymes: No results for input(s): CKTOTAL, CKMB, CKMBINDEX,  TROPONINI in the last 168 hours. CBG (last 3)  No results for input(s): GLUCAP in the last 72 hours. Recent Results (from the past 240 hour(s))  Culture, blood (routine x 2)     Status: None (Preliminary result)   Collection Time: 05/27/17  2:43 PM  Result Value Ref Range Status   Specimen Description BLOOD RIGHT ANTECUBITAL  Final   Special Requests   Final    BOTTLES DRAWN AEROBIC AND ANAEROBIC Blood Culture adequate volume   Culture NO GROWTH < 24 HOURS  Final   Report Status PENDING  Incomplete  Culture, blood (routine x 2)     Status: None (Preliminary result)   Collection Time: 05/27/17  2:43 PM  Result Value Ref Range Status   Specimen Description BLOOD LEFT ANTECUBITAL  Final   Special Requests   Final    BOTTLES DRAWN AEROBIC ONLY Blood Culture  results may not be optimal due to an inadequate volume of blood received in culture bottles   Culture NO GROWTH < 24 HOURS  Final   Report Status PENDING  Incomplete  MRSA PCR Screening     Status: None   Collection Time: 05/28/17  1:56 AM  Result Value Ref Range Status   MRSA by PCR NEGATIVE NEGATIVE Final    Comment:        The GeneXpert MRSA Assay (FDA approved for NASAL specimens only), is one component of a comprehensive MRSA colonization surveillance program. It is not intended to diagnose MRSA infection nor to guide or monitor treatment for MRSA infections.      Studies: Dg Chest Port 1 View  Result Date: 05/28/2017 CLINICAL DATA:  Fever.  History of prostate carcinoma EXAM: PORTABLE CHEST 1 VIEW COMPARISON:  May 27, 2017 FINDINGS: There is cardiomegaly. Pacemaker leads are attached to the right atrium and right ventricle. Pulmonary vascularity appears within normal limits. No pneumothorax. There is no edema or consolidation. There is aortic atherosclerosis. No evident adenopathy. No evident bone lesions. IMPRESSION: Stable cardiomegaly. There is aortic atherosclerosis. No edema or consolidation. Aortic Atherosclerosis (ICD10-I70.0). Electronically Signed   By: Lowella Grip III M.D.   On: 05/28/2017 07:50   Dg Chest Port 1 View  Result Date: 05/27/2017 CLINICAL DATA:  Cough and congestion with fever EXAM: PORTABLE CHEST 1 VIEW COMPARISON:  May 16, 2017 FINDINGS: There is no edema or consolidation. Heart is enlarged with pulmonary vascularity within normal limits. Pacemaker leads are attached to the right atrium and right ventricle. There is aortic atherosclerosis. No adenopathy. No evident bone lesions. IMPRESSION: Stable cardiomegaly. Pacemaker leads attached to right atrium and right ventricle. No edema or consolidation. There is aortic atherosclerosis. Aortic Atherosclerosis (ICD10-I70.0). Electronically Signed   By: Lowella Grip III M.D.   On: 05/27/2017  15:02   Scheduled Meds: . amLODipine  2.5 mg Oral Daily  . apixaban  5 mg Oral BID  . atorvastatin  20 mg Oral q1800  . docusate sodium  100 mg Oral BID  . donepezil  10 mg Oral q morning - 10a  . feeding supplement (ENSURE ENLIVE)  237 mL Oral BID  . levETIRAcetam  500 mg Oral BID  . memantine  5 mg Oral BID  . mirabegron ER  25 mg Oral Daily   Continuous Infusions: . lactated ringers 100 mL/hr at 05/27/17 2134  . piperacillin-tazobactam (ZOSYN)  IV 3.375 g (05/28/17 3790)  . vancomycin      Principal Problem:   Sepsis ([place]) Active Problems:   Seizures ([place])  CVA (cerebral vascular accident) [facility])   Essential hypertension   Dementia   Severe protein-calorie malnutrition (HCC)   Normocytic anemia   Hyperlipidemia   Thrombocytopenia ([facility])   Paroxysmal atrial fibrillation (H[facility])   Pacemaker   Hyperglycemia   Elevated LFTs   Time spent:   Irwin Brakeman, MD, FAAFP [facility] Pager [phone]  If 7PM-7AM, please contact night-coverage [email or web address] Password [email or web address] 05/28/2017, 9:41 AM    LOS: 1 day

## 2017-05-28 NOTE — Progress Notes (Signed)
Pt's temp still high after tylenol suppository X2, MD aware, will continue to monitor.

## 2017-05-28 NOTE — Progress Notes (Signed)
Unable to give PO meds this AM, patient lethargic, only opens eyes to voice/touch.

## 2017-05-29 LAB — CBC WITH DIFFERENTIAL/PLATELET
BASOS ABS: 0 10*3/uL (ref 0.0–0.1)
BASOS PCT: 0 %
EOS PCT: 3 %
Eosinophils Absolute: 0.4 10*3/uL (ref 0.0–0.7)
HCT: 27.7 % — ABNORMAL LOW (ref 39.0–52.0)
Hemoglobin: 9.7 g/dL — ABNORMAL LOW (ref 13.0–17.0)
LYMPHS PCT: 7 %
Lymphs Abs: 0.8 10*3/uL (ref 0.7–4.0)
MCH: 29.8 pg (ref 26.0–34.0)
MCHC: 35 g/dL (ref 30.0–36.0)
MCV: 85 fL (ref 78.0–100.0)
Monocytes Absolute: 0.8 10*3/uL (ref 0.1–1.0)
Monocytes Relative: 7 %
NEUTROS ABS: 9.1 10*3/uL — AB (ref 1.7–7.7)
Neutrophils Relative %: 82 %
PLATELETS: 113 10*3/uL — AB (ref 150–400)
RBC: 3.26 MIL/uL — AB (ref 4.22–5.81)
RDW: 14.3 % (ref 11.5–15.5)
WBC: 11.1 10*3/uL — AB (ref 4.0–10.5)

## 2017-05-29 LAB — URINE CULTURE: Culture: NO GROWTH

## 2017-05-29 LAB — COMPREHENSIVE METABOLIC PANEL
ALK PHOS: 46 U/L (ref 38–126)
ALT: 52 U/L (ref 17–63)
ANION GAP: 6 (ref 5–15)
AST: 140 U/L — ABNORMAL HIGH (ref 15–41)
Albumin: 1.8 g/dL — ABNORMAL LOW (ref 3.5–5.0)
BILIRUBIN TOTAL: 0.8 mg/dL (ref 0.3–1.2)
BUN: 28 mg/dL — AB (ref 6–20)
CALCIUM: 7.7 mg/dL — AB (ref 8.9–10.3)
CO2: 24 mmol/L (ref 22–32)
Chloride: 106 mmol/L (ref 101–111)
Creatinine, Ser: 1.14 mg/dL (ref 0.61–1.24)
GFR calc Af Amer: >60 mL/min (ref >60)
Glucose, Bld: 98 mg/dL (ref 65–99)
POTASSIUM: 3.8 mmol/L (ref 3.5–5.1)
Sodium: 136 mmol/L (ref 135–145)
TOTAL PROTEIN: 6 g/dL — AB (ref 6.5–8.1)

## 2017-05-29 LAB — HEPATITIS PANEL, ACUTE
HEP A IGM: NEGATIVE
HEP B C IGM: NEGATIVE
HEP B S AG: NEGATIVE

## 2017-05-29 MED ORDER — PRO-STAT SUGAR FREE PO LIQD
30.0000 mL | Freq: Two times a day (BID) | ORAL | Status: DC
  Administered 2017-05-29 – 2017-05-31 (×4): 30 mL via ORAL
  Filled 2017-05-29 (×4): qty 30

## 2017-05-29 MED ORDER — ADULT MULTIVITAMIN W/MINERALS CH
1.0000 | ORAL_TABLET | Freq: Every day | ORAL | Status: DC
  Administered 2017-05-29 – 2017-05-31 (×3): 1 via ORAL
  Filled 2017-05-29 (×3): qty 1

## 2017-05-29 NOTE — Progress Notes (Signed)
PT Cancellation Note  Patient Details Name: Kenneth Vazquez MRN: [id number] DOB: Feb 10, 1941   Cancelled Treatment:    Reason Eval/Treat Not Completed: Fatigue/lethargy limiting ability to participate. Chart reviewed, PT evaluation attempted. Pt easily arousable with verbal simulation, although he falls asleep multiple times during questioning. Pt is with flat affect and delayed response time. He is not currently following simple commands when cued, given multiple times to bring finger to nose. He also has delayed initiation of movement. This is not patient's baseline cognitive functional status. No family caregiver in room to assist with background. Pt not cognitively able to participate in evaluation at this time. PT will attempt again at later date and time as able.    10:15 AM, 05/29/17 Etta Grandchild, PT, DPT Physical Therapist - [place] [phone] (Office)   Freda Jaquith C 05/29/2017, 10:12 AM

## 2017-05-29 NOTE — Progress Notes (Signed)
Initial Nutrition Assessment   INTERVENTION:  Ensure Enlive po BID, each supplement provides 350 kcal and 20 grams of protein   ProStat 30 ml BID (each 30 ml provides 100 kcal, 15 gr protein)   Add MVI daily - suspect deficiencies given his significant wt loss   NUTRITION DIAGNOSIS:   Inadequate oral intake related to chronic illness (dementia and septic with UTI on admission) as evidenced by percent weight loss 14% in 6 months.   GOAL:   Patient will meet greater than or equal to 90% of their needs   MONITOR:   PO intake, Supplement acceptance, Weight trends, Labs, Skin  REASON FOR ASSESSMENT:   Malnutrition Screening Tool    ASSESSMENT:  Kenneth Vazquez is here alone currently. He lives with his wife but was recently discharged from ALF. He presented to the ED with fever and has a UTI. His hx is significant for dementia, CVA, Arthritis and prostate cancer.    Diet hx: limited by pt cognitive impairment. His lunch tray is here and 50% is consumed. Intake for lunch and dinner last night documented as 100%. Unclear if he is able to feed himself.   Home diet is regular.   Weight hx: the past has significant wt loss over the past 6 months - down 12 kg (14%). Usual body wt 81-82 kg in March this year and was 85 kg  02/07/2015.   Nutrition-Focused physical exam completed. Findings are mild upper arm fat depletion, mild-moderate temporal and dorsal muscle depletion, and no edema. Suspect malnutrition but unable to diagnose due to pt limited ability to provide intake details.     Recent Labs Lab 05/27/17 1432 05/28/17 0503 05/29/17 0610  NA 127* 131* 136  K 4.5 3.9 3.8  CL 97* 102 106  CO2 22 21* 24  BUN 22* 23* 28*  CREATININE 1.10 1.02 1.14  CALCIUM 7.7* 7.3* 7.7*  GLUCOSE 140* 102* 98   Labs: BUN-28, Albumin 1.8 , WBC-11.1 and lactic acid 1.7.   Diet Order:  Diet heart healthy/carb modified Room service appropriate? Yes; Fluid consistency: Thin  Skin:   (Dry and flaky  especially lower bilater shins and has a scab on right knee)  Last BM:  9/27   Height:   Ht Readings from Last 1 Encounters:  05/16/17 6' (1.829 m)    Weight:   Wt Readings from Last 1 Encounters:  05/27/17 154 lb 4.8 oz (70 kg)    Ideal Body Weight:  81 kg  BMI:  Body mass index is 20.93 kg/m.  Estimated Nutritional Needs:   Kcal:  1800-1900  Protein:  100-110 gr  Fluid:  1.8-1.9 litedrs daily  EDUCATION NEEDS:   Education needs no appropriate at this time Colman Cater MS,RD,CSG,LDN Office: [phone] Pager: [phone]

## 2017-05-29 NOTE — Progress Notes (Addendum)
PROGRESS NOTE   Kenneth Vazquez  DGU:[id number]  DOB: Sep 22, 1940  DOA: 05/27/2017 PCP: Jodi Marble, MD   Brief Admission Hx: Kenneth Vazquez is a 76 y.o. 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   MDM/Assessment & Plan:  1. Sepsis from suspected urinary source - Continue current management with IV antibiotics and supportive care.  Lactic acid has trended down to Kenneth range.  Follow clinically. He is slowly improving clinically.   2. UTI - continue IV zosyn pending urine culture findings.  3. Recent CVA with residual right hemiparesis - Pt completed rehab and was sent home, get PT eval while in hospital.   4. Essential Hypertension - stable on amlodipine daily.  5. Dyslipidemia - resume home lipitor daily.  6. Dementia - likely vascular, stable.  7. Severe Protein Calorie Malnutrition - Dietitian consult requested.   8. Thrombocytopenia - stable, following on lovenox.  9. Seizure disorder - resumed home keppra, reported to have occasional breakthrough seizures.  10. Anemia - Following.  11. Hyponatremia - resolved now with IVFs.   12. Controlled Afib - resume home eliquis for anticoagulation, rate is controlled has pacemaker. 13. Elevated LFTs - hepatitis panel pending.  Following.   DVT prophylaxis: eliquis  Code Status: DNR Family Communication: Wife at bedside  Disposition Plan:  TBD   Subjective: Pt is more alert, he is still having fever but trending down overall.    Objective: Vitals:   05/28/17 2009 05/28/17 2058 05/28/17 2315 05/29/17 0605  BP: 121/68   139/68  Pulse: 62   72  Resp: 16   18  Temp: (!) 101.7 F (38.7 C)  100.1 F (37.8 C) 99.9 F (37.7 C)  TempSrc: Rectal  Rectal Rectal  SpO2: 90% 99%  100%  Weight:        Intake/Output Summary (Last 24 hours) at 05/29/17 0850 Last data filed at 05/29/17 0500  Gross  per 24 hour  Intake          2061.67 ml  Output             1700 ml  Net           361.67 ml   Filed Weights   05/27/17 2123  Weight: 70 kg (154 lb 4.8 oz)    REVIEW OF SYSTEMS  As per history otherwise all reviewed and reported negative  Exam:  General exam: demented elderly male, NAD. He is more alert today and answering questions.   Respiratory system: Clear. No increased work of breathing. Cardiovascular system: S1 & S2 heard Gastrointestinal system: Abdomen is nondistended, soft and nontender. Kenneth bowel sounds heard. Central nervous system: easily arousable, but drifts in/out of sleep Extremities: no CCE.  Data Reviewed: Basic Metabolic Panel:  Recent Labs Lab 05/27/17 1432 05/28/17 0503 05/29/17 0610  NA 127* 131* 136  K 4.5 3.9 3.8  CL 97* 102 106  CO2 22 21* 24  GLUCOSE 140* 102* 98  BUN 22* 23* 28*  CREATININE 1.10 1.02 1.14  CALCIUM 7.7* 7.3* 7.7*   Liver Function Tests:  Recent Labs Lab 05/27/17 1432 05/28/17 0503 05/29/17 0610  AST 155* 139* 140*  ALT 43 40 52  ALKPHOS 42 34* 46  BILITOT 0.8 0.8 0.8  PROT 6.8 5.6* 6.0*  ALBUMIN 2.1* 1.7* 1.8*   No results for input(s): LIPASE, AMYLASE in  the last 168 hours. No results for input(s): AMMONIA in the last 168 hours. CBC:  Recent Labs Lab 05/27/17 1432 05/28/17 0503 05/29/17 0610  WBC 10.1 9.8 11.1*  NEUTROABS 8.7*  --  9.1*  HGB 10.2* 8.9* 9.7*  HCT 28.4* 25.7* 27.7*  MCV 84.3 84.5 85.0  PLT 135* 125* 113*   Cardiac Enzymes: No results for input(s): CKTOTAL, CKMB, CKMBINDEX, TROPONINI in the last 168 hours. CBG (last 3)  No results for input(s): GLUCAP in the last 72 hours. Recent Results (from the past 240 hour(s))  Culture, blood (routine x 2)     Status: None (Preliminary result)   Collection Time: 05/27/17  2:43 PM  Result Value Ref Range Status   Specimen Description BLOOD RIGHT ANTECUBITAL  Final   Special Requests   Final    BOTTLES DRAWN AEROBIC AND ANAEROBIC Blood  Culture adequate volume   Culture NO GROWTH < 24 HOURS  Final   Report Status PENDING  Incomplete  Culture, blood (routine x 2)     Status: None (Preliminary result)   Collection Time: 05/27/17  2:43 PM  Result Value Ref Range Status   Specimen Description BLOOD LEFT ANTECUBITAL  Final   Special Requests   Final    BOTTLES DRAWN AEROBIC ONLY Blood Culture results may not be optimal due to an inadequate volume of blood received in culture bottles   Culture NO GROWTH < 24 HOURS  Final   Report Status PENDING  Incomplete  MRSA PCR Screening     Status: None   Collection Time: 05/28/17  1:56 AM  Result Value Ref Range Status   MRSA by PCR NEGATIVE NEGATIVE Final    Comment:        The GeneXpert MRSA Assay (FDA approved for NASAL specimens only), is one component of a comprehensive MRSA colonization surveillance program. It is not intended to diagnose MRSA infection nor to guide or monitor treatment for MRSA infections.      Studies: Dg Chest Port 1 View  Result Date: 05/28/2017 CLINICAL DATA:  Fever.  History of prostate carcinoma EXAM: PORTABLE CHEST 1 VIEW COMPARISON:  May 27, 2017 FINDINGS: There is cardiomegaly. Pacemaker leads are attached to the right atrium and right ventricle. Pulmonary vascularity appears within Kenneth limits. No pneumothorax. There is no edema or consolidation. There is aortic atherosclerosis. No evident adenopathy. No evident bone lesions. IMPRESSION: Stable cardiomegaly. There is aortic atherosclerosis. No edema or consolidation. Aortic Atherosclerosis (ICD10-I70.0). Electronically Signed   By: Lowella Grip III M.D.   On: 05/28/2017 07:50   Dg Chest Port 1 View  Result Date: 05/27/2017 CLINICAL DATA:  Cough and congestion with fever EXAM: PORTABLE CHEST 1 VIEW COMPARISON:  May 16, 2017 FINDINGS: There is no edema or consolidation. Heart is enlarged with pulmonary vascularity within Kenneth limits. Pacemaker leads are attached to the right  atrium and right ventricle. There is aortic atherosclerosis. No adenopathy. No evident bone lesions. IMPRESSION: Stable cardiomegaly. Pacemaker leads attached to right atrium and right ventricle. No edema or consolidation. There is aortic atherosclerosis. Aortic Atherosclerosis (ICD10-I70.0). Electronically Signed   By: Lowella Grip III M.D.   On: 05/27/2017 15:02   Scheduled Meds: . amLODipine  2.5 mg Oral Daily  . apixaban  5 mg Oral BID  . atorvastatin  20 mg Oral q1800  . docusate sodium  100 mg Oral BID  . donepezil  10 mg Oral q morning - 10a  . feeding supplement (ENSURE ENLIVE)  237 mL Oral  BID  . levETIRAcetam  500 mg Oral BID  . memantine  5 mg Oral BID  . mirabegron ER  25 mg Oral Daily   Continuous Infusions: . lactated ringers 100 mL/hr at 05/29/17 7169  . piperacillin-tazobactam (ZOSYN)  IV 3.375 g (05/29/17 6789)  . vancomycin Stopped (05/28/17 1520)    Principal Problem:   Sepsis ([place]) Active Problems:   Seizures ([place])   CVA (cerebral vascular accident) ([place])   Essential hypertension   Dementia   Severe protein-calorie malnutrition (HCC)   Normocytic anemia   Hyperlipidemia   Thrombocytopenia (HCC)   Paroxysmal atrial fibrillation (HCC)   Pacemaker   Hyperglycemia   Elevated LFTs   Time spent:   Irwin Brakeman, MD, FAAFP [facility] Pager [phone]  If 7PM-7AM, please contact night-coverage [email or web address] Password [email or web address] 05/29/2017, 8:50 AM    LOS: 2 days

## 2017-05-30 ENCOUNTER — Inpatient Hospital Stay (HOSPITAL_COMMUNITY): Payer: Medicare Other

## 2017-05-30 LAB — COMPREHENSIVE METABOLIC PANEL
ALT: 52 U/L (ref 17–63)
AST: 115 U/L — AB (ref 15–41)
Albumin: 1.7 g/dL — ABNORMAL LOW (ref 3.5–5.0)
Alkaline Phosphatase: 46 U/L (ref 38–126)
Anion gap: 7 (ref 5–15)
BILIRUBIN TOTAL: 0.8 mg/dL (ref 0.3–1.2)
BUN: 22 mg/dL — AB (ref 6–20)
CALCIUM: 7.6 mg/dL — AB (ref 8.9–10.3)
CHLORIDE: 106 mmol/L (ref 101–111)
CO2: 24 mmol/L (ref 22–32)
CREATININE: 0.88 mg/dL (ref 0.61–1.24)
GFR calc non Af Amer: >60 mL/min (ref >60)
Glucose, Bld: 91 mg/dL (ref 65–99)
Potassium: 3.6 mmol/L (ref 3.5–5.1)
Sodium: 137 mmol/L (ref 135–145)
Total Protein: 5.7 g/dL — ABNORMAL LOW (ref 6.5–8.1)

## 2017-05-30 LAB — CBC WITH DIFFERENTIAL/PLATELET
BASOS PCT: 0 %
Basophils Absolute: 0 10*3/uL (ref 0.0–0.1)
EOS ABS: 0.4 10*3/uL (ref 0.0–0.7)
EOS PCT: 5 %
HCT: 24.5 % — ABNORMAL LOW (ref 39.0–52.0)
Hemoglobin: 8.5 g/dL — ABNORMAL LOW (ref 13.0–17.0)
Lymphocytes Relative: 10 %
Lymphs Abs: 0.7 10*3/uL (ref 0.7–4.0)
MCH: 29.3 pg (ref 26.0–34.0)
MCHC: 34.7 g/dL (ref 30.0–36.0)
MCV: 84.5 fL (ref 78.0–100.0)
MONO ABS: 0.5 10*3/uL (ref 0.1–1.0)
MONOS PCT: 7 %
Neutro Abs: 5.8 10*3/uL (ref 1.7–7.7)
Neutrophils Relative %: 78 %
PLATELETS: 129 10*3/uL — AB (ref 150–400)
RBC: 2.9 MIL/uL — ABNORMAL LOW (ref 4.22–5.81)
RDW: 14.9 % (ref 11.5–15.5)
WBC: 7.3 10*3/uL (ref 4.0–10.5)

## 2017-05-30 MED ORDER — POLYETHYLENE GLYCOL 3350 17 G PO PACK: 17.0000 g | PACK | Freq: Two times a day (BID) | ORAL | Status: DC

## 2017-05-30 MED ORDER — IOPAMIDOL (ISOVUE-300) INJECTION 61%
INTRAVENOUS | Status: AC
  Administered 2017-05-30: 30 mL
  Filled 2017-05-30: qty 30

## 2017-05-30 MED ORDER — SORBITOL 70 % SOLN
960.0000 mL | TOPICAL_OIL | Freq: Once | ORAL | Status: DC
  Filled 2017-05-30: qty 240

## 2017-05-30 MED ORDER — POLYETHYLENE GLYCOL 3350 17 G PO PACK
17.0000 g | PACK | ORAL | Status: DC
  Administered 2017-05-30 – 2017-05-31 (×2): 17 g via ORAL
  Filled 2017-05-30 (×2): qty 1

## 2017-05-30 MED ORDER — ACETAMINOPHEN 325 MG PO TABS
650.0000 mg | ORAL_TABLET | ORAL | Status: DC
  Administered 2017-05-30 – 2017-05-31 (×6): 650 mg via ORAL
  Filled 2017-05-30 (×7): qty 2

## 2017-05-30 MED ORDER — DOXYCYCLINE HYCLATE 100 MG PO TABS
100.0000 mg | ORAL_TABLET | Freq: Two times a day (BID) | ORAL | Status: DC
  Administered 2017-05-30 – 2017-05-31 (×3): 100 mg via ORAL
  Filled 2017-05-30 (×3): qty 1

## 2017-05-30 MED ORDER — IOPAMIDOL (ISOVUE-300) INJECTION 61%
100.0000 mL | Freq: Once | INTRAVENOUS | Status: AC | PRN
  Administered 2017-05-30: 100 mL via INTRAVENOUS

## 2017-05-30 NOTE — Progress Notes (Signed)
Pharmacy Antibiotic Note  Kenneth Vazquez is a 76 y.o. male admitted on 05/27/2017 with sepsis.  Pharmacy has been consulted for vancomycin and zosyn dosing.Zosyn 3.375 gm IV ordered in the ED  Plan: Zosyn 3.375g IV q8h (4 hour infusion). F/u renal function, cultures and clinical course Weight: 154 lb 4.8 oz (70 kg)  Temp (24hrs), Avg:100.3 F (37.9 C), Min:99.6 F (37.6 C), Max:100.9 F (38.3 C)   Recent Labs Lab 05/27/17 1432 05/27/17 1518 05/27/17 1818 05/27/17 2138 05/28/17 0013 05/28/17 0503 05/29/17 0610 05/30/17 0501  WBC 10.1  --   --   --   --  9.8 11.1* 7.3  CREATININE 1.10  --   --   --   --  1.02 1.14 0.88  LATICACIDVEN  --  3.94* 1.16 1.3 1.7  --   --   --     Estimated Creatinine Clearance: 69.6 mL/min (by C-G formula based on SCr of 0.88 mg/dL).  Allergies  Allergen Reactions  . Sulfa Antibiotics Hives  . Sulfasalazine Hives   Antimicrobials this admission: vanc 9/25 >> 9/27 zosyn 9/25 >>   Thank you for allowing pharmacy to be a part of this patient's care.  Hart Robinsons A 05/30/2017 8:22 AM

## 2017-05-30 NOTE — Care Management Note (Addendum)
Case Management Note  Patient Details  Name: Kenneth Vazquez MRN: [id number] Date of Birth: 1941-05-16  Subjective/Objective:   Chart reviewed. No CM consult. Patient from home with wife. History of CVA x 2, dementia. Recent stay at rehab.  Patient  non ambulatory at home since rehab stay but does do transfers, has been non ambulatory here. PT consulted but unable to work with patient due lethargy. Adm with sepsis, still having recurrent fevers.                Action/Plan: CM following for needs and PT recommendations. Discussed with wife, she would want [facility] if home health is needed at time of discharge.    Expected Discharge Date:    06/02/2017              Expected Discharge Plan:     In-House Referral:     Discharge planning Services  CM Consult  Post Acute Care Choice:    Choice offered to:     DME Arranged:    DME Agency:     HH Arranged:    HH Agency:     Status of Service:  In process, will continue to follow  If discussed at Long Length of Stay Meetings, dates discussed:    Additional Comments:  Vannie Hochstetler, Chauncey Reading, RN 05/30/2017, 2:58 PM

## 2017-05-30 NOTE — Progress Notes (Signed)
PROGRESS NOTE   COLIE FUGITT  IWO:[id number]  DOB: 1941/03/07  DOA: 05/27/2017 PCP: Jodi Marble, MD  Brief Admission Hx: KALON ERHARDT is a 76 y.o. 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   MDM/Assessment & Plan:  1. Sepsis from suspected urinary source - Continue current management with IV antibiotics and supportive care.  Lactic acid has trended down to normal range.  Follow clinically. He is slowly improving clinically.  Urine culture was negative.  I am ordering CT chest/abd/pelvis to rule out occult infection that may have been missed as he continues to have fever although it is trending down.  De-escalate zosyn to doxycycline today.   2. UTI - urine culture negative.   3. Recent CVA with residual right hemiparesis - Pt completed rehab and was sent home, likely will need HHPT.    4. Essential Hypertension - stable on amlodipine daily.  5. Dyslipidemia - resume home lipitor daily.  6. Dementia - likely vascular, stable.  7. Severe Protein Calorie Malnutrition - Dietitian consult appreciated.   8. Thrombocytopenia - stable, following on lovenox.  9. Seizure disorder - resumed home keppra, reported to have occasional breakthrough seizures.  10. Anemia - Following.  11. Hyponatremia - resolved now with IVFs.   12. Controlled Afib - resumed home eliquis for anticoagulation, rate is controlled has pacemaker. 13. Elevated AST - hepatitis panel negative.   ALT has been normal. AST trending down.   DVT prophylaxis: eliquis  Code Status: DNR Family Communication: Wife at bedside  Disposition Plan:  Hopefully home in 1-2 days   Subjective: Pt is eating and drinking much better today.   Objective: Vitals:   05/28/17 2315 05/29/17 0605 05/29/17 1546 05/29/17 2200  BP:  139/68 123/79 138/64  Pulse:  72 86 80  Resp:  18 17 20   Temp: 100.1  F (37.8 C) 99.9 F (37.7 C) 99.6 F (37.6 C) (!) 100.9 F (38.3 C)  TempSrc: Rectal Rectal Axillary Rectal  SpO2:  100% 99% 100%  Weight:        Intake/Output Summary (Last 24 hours) at 05/30/17 5003 Last data filed at 05/30/17 0900  Gross per 24 hour  Intake              240 ml  Output              300 ml  Net              -60 ml   Filed Weights   05/27/17 2123  Weight: 70 kg (154 lb 4.8 oz)   REVIEW OF SYSTEMS  As per history otherwise all reviewed and reported negative  Exam:  General exam: demented elderly male, NAD. He is alert and answering questions appropriately.   Respiratory system: Clear. No increased work of breathing. Cardiovascular system: S1 & S2 heard Gastrointestinal system: Abdomen is nondistended, soft and nontender. Normal bowel sounds heard. Central nervous system: easily arousable, right hemiparesis (chronic) Extremities: no CCE.  Data Reviewed: Basic Metabolic Panel:  Recent Labs Lab 05/27/17 1432 05/28/17 0503 05/29/17 0610 05/30/17 0501  NA 127* 131* 136 137  K 4.5 3.9 3.8 3.6  CL 97* 102 106 106  CO2 22 21* 24 24  GLUCOSE 140* 102* 98 91  BUN 22* 23* 28* 22*  CREATININE 1.10 1.02 1.14 0.88  CALCIUM 7.7* 7.3* 7.7*  7.6*   Liver Function Tests:  Recent Labs Lab 05/27/17 1432 05/28/17 0503 05/29/17 0610 05/30/17 0501  AST 155* 139* 140* 115*  ALT 43 40 52 52  ALKPHOS 42 34* 46 46  BILITOT 0.8 0.8 0.8 0.8  PROT 6.8 5.6* 6.0* 5.7*  ALBUMIN 2.1* 1.7* 1.8* 1.7*   No results for input(s): LIPASE, AMYLASE in the last 168 hours. No results for input(s): AMMONIA in the last 168 hours. CBC:  Recent Labs Lab 05/27/17 1432 05/28/17 0503 05/29/17 0610 05/30/17 0501  WBC 10.1 9.8 11.1* 7.3  NEUTROABS 8.7*  --  9.1* 5.8  HGB 10.2* 8.9* 9.7* 8.5*  HCT 28.4* 25.7* 27.7* 24.5*  MCV 84.3 84.5 85.0 84.5  PLT 135* 125* 113* 129*   Cardiac Enzymes: No results for input(s): CKTOTAL, CKMB, CKMBINDEX, TROPONINI in the last 168  hours. CBG (last 3)  No results for input(s): GLUCAP in the last 72 hours. Recent Results (from the past 240 hour(s))  Culture, blood (routine x 2)     Status: None (Preliminary result)   Collection Time: 05/27/17  2:43 PM  Result Value Ref Range Status   Specimen Description BLOOD RIGHT ANTECUBITAL  Final   Special Requests   Final    BOTTLES DRAWN AEROBIC AND ANAEROBIC Blood Culture adequate volume   Culture NO GROWTH 3 DAYS  Final   Report Status PENDING  Incomplete  Culture, blood (routine x 2)     Status: None (Preliminary result)   Collection Time: 05/27/17  2:43 PM  Result Value Ref Range Status   Specimen Description BLOOD LEFT ANTECUBITAL  Final   Special Requests   Final    BOTTLES DRAWN AEROBIC ONLY Blood Culture results may not be optimal due to an inadequate volume of blood received in culture bottles   Culture NO GROWTH 3 DAYS  Final   Report Status PENDING  Incomplete  Urine culture     Status: None   Collection Time: 05/27/17  5:10 PM  Result Value Ref Range Status   Specimen Description URINE, CLEAN CATCH  Final   Special Requests NONE  Final   Culture   Final    NO GROWTH Performed at [facility], [place] [address], [place], [place] [address]    Report Status 05/29/2017 FINAL  Final  MRSA PCR Screening     Status: None   Collection Time: 05/28/17  1:56 AM  Result Value Ref Range Status   MRSA by PCR NEGATIVE NEGATIVE Final    Comment:        The GeneXpert MRSA Assay (FDA approved for NASAL specimens only), is one component of a comprehensive MRSA colonization surveillance program. It is not intended to diagnose MRSA infection nor to guide or monitor treatment for MRSA infections.     Studies: No results found. Scheduled Meds: . acetaminophen  650 mg Oral Q4H  . amLODipine  2.5 mg Oral Daily  . apixaban  5 mg Oral BID  . atorvastatin  20 mg Oral q1800  . docusate sodium  100 mg Oral BID  . donepezil  10 mg Oral q morning - 10a  . doxycycline   100 mg Oral Q12H  . feeding supplement (ENSURE ENLIVE)  237 mL Oral BID  . feeding supplement (PRO-STAT SUGAR FREE 64)  30 mL Oral BID  . levETIRAcetam  500 mg Oral BID  . memantine  5 mg Oral BID  . mirabegron ER  25 mg Oral Daily  . multivitamin with minerals  1 tablet Oral Daily   Continuous Infusions: . lactated ringers 65 mL/hr at 05/29/17 2152  . piperacillin-tazobactam (ZOSYN)  IV 3.375 g (05/30/17 0615)    Principal Problem:   Sepsis ([place]) Active Problems:   Seizures ([place])   CVA (cerebral vascular accident) ([place])   Essential hypertension   Dementia   Severe protein-calorie malnutrition (HCC)   Normocytic anemia   Hyperlipidemia   Thrombocytopenia (HCC)   Paroxysmal atrial fibrillation (HCC)   Pacemaker   Hyperglycemia   Elevated LFTs  Time spent:   Irwin Brakeman, MD, FAAFP [facility] Pager [phone]  If 7PM-7AM, please contact night-coverage [email or web address] Password [email or web address] 05/30/2017, 9:27 AM    LOS: 3 days

## 2017-05-30 NOTE — Evaluation (Signed)
Physical Therapy Evaluation Patient Details Name: Kenneth Vazquez MRN: [id number] DOB: 1941/06/24 Today's Date: 05/30/2017   History of Present Illness  Pt is a 76 y/o male s/p Lt MCA CVA. Admitted for sepsis and suspected UTI.  PMH includes CVA x 2 in 2105, seizures, HTN, pacemaker, dementia, and carotid artery disease   Clinical Impression  Pt is up to the chair with max assist transfer, positioned to begin having his dinner.  Pt is a person who needs to be assisted to eat, but having some limited success with him cooperating to eat bites with help.  Pt was handed off to CNA to continue his meal.  Focus on basic movement with pt to increase his compliance and strength, and will work toward better sitting and standing balance control.    Follow Up Recommendations SNF    Equipment Recommendations  None recommended by PT    Recommendations for Other Services       Precautions / Restrictions Precautions Precautions: Fall Precaution Comments: Pt is minimally able to follow instructions but did let PT get him to chair Restrictions Weight Bearing Restrictions: No      Mobility  Bed Mobility Overal bed mobility: Needs Assistance Bed Mobility: Supine to Sit     Supine to sit: Max assist     General bed mobility comments: pt does not follow instructions but is compliant most of the time with being assisted  Transfers Overall transfer level: Needs assistance Equipment used: 1 person hand held assist;Rolling walker (2 wheeled) Transfers: Stand Pivot Transfers;Sit to/from Stand Sit to Stand: Max assist Stand pivot transfers: Max assist       General transfer comment: pt is not fighting but not very helpful to assist through his legs  Ambulation/Gait             General Gait Details: unable  Stairs            Wheelchair Mobility    Modified Rankin (Stroke Patients Only)       Balance Overall balance assessment: Needs assistance Sitting-balance support:  Feet supported;Bilateral upper extremity supported Sitting balance-Leahy Scale: Poor       Standing balance-Leahy Scale: Zero                               Pertinent Vitals/Pain Pain Assessment: No/denies pain    Home Living Family/patient expects to be discharged to:: Private residence Living Arrangements: Spouse/significant other Available Help at Discharge: Available 24 hours/day;Family Type of Home: House Home Access: Stairs to enter   [employer] of Steps: 4    Home Equipment: Environmental consultant - 2 wheels;Cane - single point Additional Comments: information gained from chart, wife is gone and pt cannot answer questions    Prior Function Level of Independence: Needs assistance   Gait / Transfers Assistance Needed: wife not present to know PLOF           Hand Dominance   Dominant Hand: Left    Extremity/Trunk Assessment   Upper Extremity Assessment Upper Extremity Assessment: Generalized weakness    Lower Extremity Assessment Lower Extremity Assessment: Generalized weakness    Cervical / Trunk Assessment Cervical / Trunk Assessment: Kyphotic  Communication   Communication: Expressive difficulties  Cognition Arousal/Alertness: Lethargic Behavior During Therapy: Flat affect Overall Cognitive Status: History of cognitive impairments - at baseline  General Comments General comments (skin integrity, edema, etc.): Pt has a condom cath, transferred to chair max assist and elevated LE's for comfort, nursing in to feed him and monitor safety    Exercises     Assessment/Plan    PT Assessment Patient needs continued PT services  PT Problem List Decreased strength;Decreased range of motion;Decreased activity tolerance;Decreased balance;Decreased coordination;Decreased mobility;Decreased cognition;Decreased knowledge of use of DME;Decreased safety awareness       PT Treatment Interventions  DME instruction;Gait training;Functional mobility training;Therapeutic activities;Therapeutic exercise;Balance training;Neuromuscular re-education;Patient/family education    PT Goals (Current goals can be found in the Care Plan section)  Acute Rehab PT Goals Patient Stated Goal: none stated PT Goal Formulation: Patient unable to participate in goal setting Time For Goal Achievement: 06/13/17 Potential to Achieve Goals: Fair    Frequency Min 2X/week   Barriers to discharge Other (comment) wife not present to know his situation    Co-evaluation               AM-PAC PT "6 Clicks" Daily Activity  Outcome Measure Difficulty turning over in bed (including adjusting bedclothes, sheets and blankets)?: Unable Difficulty moving from lying on back to sitting on the side of the bed? : Unable Difficulty sitting down on and standing up from a chair with arms (e.g., wheelchair, bedside commode, etc,.)?: Unable Help needed moving to and from a bed to chair (including a wheelchair)?: A Lot Help needed walking in hospital room?: Total Help needed climbing 3-5 steps with a railing? : Total 6 Click Score: 7    End of Session Equipment Utilized During Treatment: Gait belt Activity Tolerance: Patient limited by fatigue;Patient limited by lethargy Patient left: in chair;with call bell/phone within reach;with nursing/sitter in room (no chair alarm pads available) Nurse Communication: Mobility status PT Visit Diagnosis: Unsteadiness on feet (R26.81);Muscle weakness (generalized) (M62.81);Difficulty in walking, not elsewhere classified (R26.2);Hemiplegia and hemiparesis Hemiplegia - Right/Left: Right Hemiplegia - dominant/non-dominant: Dominant    Time: 7846-9629 PT Time Calculation (min) (ACUTE ONLY): 32 min   Charges:   PT Evaluation $PT Eval Moderate Complexity: 1 Mod PT Treatments $Therapeutic Activity: 8-22 mins   PT G Codes:   PT G-Codes **NOT FOR INPATIENT CLASS** Functional  Assessment Tool Used: AM-PAC 6 Clicks Basic Mobility    Ramond Dial 05/30/2017, 5:42 PM   5:45 PM, 05/30/17 Mee Hives, PT, MS Physical Therapist - [place] [phone] (Office)

## 2017-05-30 NOTE — Care Management Important Message (Signed)
Important Message  Patient Details  Name: Kenneth Vazquez MRN: [id number] Date of Birth: 06/27/1941   Medicare Important Message Given:  Yes    Avrey Flanagin, Chauncey Reading, RN 05/30/2017, 2:12 PM

## 2017-05-31 LAB — COMPREHENSIVE METABOLIC PANEL
ALBUMIN: 1.7 g/dL — AB (ref 3.5–5.0)
ALK PHOS: 47 U/L (ref 38–126)
ALT: 46 U/L (ref 17–63)
AST: 80 U/L — AB (ref 15–41)
Anion gap: 5 (ref 5–15)
BILIRUBIN TOTAL: 0.5 mg/dL (ref 0.3–1.2)
BUN: 19 mg/dL (ref 6–20)
CALCIUM: 7.6 mg/dL — AB (ref 8.9–10.3)
CO2: 26 mmol/L (ref 22–32)
CREATININE: 0.78 mg/dL (ref 0.61–1.24)
Chloride: 108 mmol/L (ref 101–111)
GFR calc Af Amer: >60 mL/min (ref >60)
GFR calc non Af Amer: >60 mL/min (ref >60)
GLUCOSE: 104 mg/dL — AB (ref 65–99)
Potassium: 3.6 mmol/L (ref 3.5–5.1)
Sodium: 139 mmol/L (ref 135–145)
TOTAL PROTEIN: 5.5 g/dL — AB (ref 6.5–8.1)

## 2017-05-31 LAB — CBC WITH DIFFERENTIAL/PLATELET
BASOS ABS: 0 10*3/uL (ref 0.0–0.1)
BASOS PCT: 0 %
EOS ABS: 0.5 10*3/uL (ref 0.0–0.7)
EOS PCT: 9 %
HCT: 22.1 % — ABNORMAL LOW (ref 39.0–52.0)
Hemoglobin: 7.8 g/dL — ABNORMAL LOW (ref 13.0–17.0)
Lymphocytes Relative: 14 %
Lymphs Abs: 0.8 10*3/uL (ref 0.7–4.0)
MCH: 30 pg (ref 26.0–34.0)
MCHC: 35.3 g/dL (ref 30.0–36.0)
MCV: 85 fL (ref 78.0–100.0)
MONO ABS: 0.5 10*3/uL (ref 0.1–1.0)
Monocytes Relative: 8 %
Neutro Abs: 4 10*3/uL (ref 1.7–7.7)
Neutrophils Relative %: 69 %
PLATELETS: 145 10*3/uL — AB (ref 150–400)
RBC: 2.6 MIL/uL — ABNORMAL LOW (ref 4.22–5.81)
RDW: 14.9 % (ref 11.5–15.5)
WBC: 5.7 10*3/uL (ref 4.0–10.5)

## 2017-05-31 MED ORDER — POLYETHYLENE GLYCOL 3350 17 G PO PACK: 17.0000 g | PACK | Freq: Two times a day (BID) | ORAL | 0 refills | Status: DC

## 2017-05-31 MED ORDER — ADULT MULTIVITAMIN W/MINERALS CH: 1.0000 | ORAL_TABLET | Freq: Every day | ORAL | Status: DC

## 2017-05-31 MED ORDER — MAGNESIUM HYDROXIDE 400 MG/5ML PO SUSP
960.0000 mL | Freq: Once | ORAL | Status: AC
  Administered 2017-05-31: 960 mL via RECTAL
  Filled 2017-05-31: qty 240

## 2017-05-31 MED ORDER — DOXYCYCLINE HYCLATE 100 MG PO TABS
100.0000 mg | ORAL_TABLET | Freq: Two times a day (BID) | ORAL | 0 refills | Status: AC
Start: 2017-05-31 — End: 2017-06-07

## 2017-05-31 NOTE — Discharge Instructions (Signed)
Follow with Primary MD  Jodi Marble, MD  and other consultant's as instructed your Hospitalist MD  Please get a complete blood count and chemistry panel checked by your Primary MD at your next visit, and again as instructed by your Primary MD.  Get Medicines reviewed and adjusted: Please take all your medications with you for your next visit with your Primary MD  Laboratory/radiological data: Please request your Primary MD to go over all hospital tests and procedure/radiological results at the follow up, please ask your Primary MD to get all Hospital records sent to his/her office.  In some cases, they will be blood work, cultures and biopsy results pending at the time of your discharge. Please request that your primary care M.D. follows up on these results.  Also Note the following: If you experience worsening of your admission symptoms, develop shortness of breath, life threatening emergency, suicidal or homicidal thoughts you must seek medical attention immediately by calling 911 or calling your MD immediately  if symptoms less severe.  You must read complete instructions/literature along with all the possible adverse reactions/side effects for all the Medicines you take and that have been prescribed to you. Take any new Medicines after you have completely understood and accpet all the possible adverse reactions/side effects.   Do not drive when taking Pain medications or sleeping medications (Benzodaizepines)  Do not take more than prescribed Pain, Sleep and Anxiety Medications. It is not advisable to combine anxiety,sleep and pain medications without talking with your primary care practitioner  Special Instructions: If you have smoked or chewed Tobacco  in the last 2 yrs please stop smoking, stop any regular Alcohol  and or any Recreational drug use.  Wear Seat belts while driving.  Please note: You were cared for by a hospitalist during your hospital stay. Once you are  discharged, your primary care physician will handle any further medical issues. Please note that NO REFILLS for any discharge medications will be authorized once you are discharged, as it is imperative that you return to your primary care physician (or establish a relationship with a primary care physician if you do not have one) for your post hospital discharge needs so that they can reassess your need for medications and monitor your lab values.

## 2017-05-31 NOTE — Discharge Summary (Addendum)
Physician Discharge Summary  Kenneth Vazquez ZDG:[id number] DOB: 08-Aug-1941 DOA: 05/27/2017  PCP: Jodi Marble, MD Consultants:  Darrow Bussing - cardiology; Manuella Ghazi - neurology; Remer Macho - rheumatology; Terance Hart - urology Patient coming from:  Home - lives with wife; NOK: wife, [phone]  Admit date: 05/27/2017 Discharge date: 05/31/2017  Disposition: [place]  Recommendations for Outpatient Follow-up:  1. Follow up with PCP in 2 weeks 2. Please obtain BMP/CBC in 1-2 weeks 3. Continue doxycycline for 7 days then discontinue 4. Please continue bowel program to avoid constipation  Discharge Condition: STABLE   CODE STATUS: DNR   Brief Hospitalization Summary: Please see all hospital notes, images, labs for full details of the hospitalization.  Chief Complaint: fever  HPI: Kenneth Vazquez is a 76 y.o. 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  They were trying to treat with Tylenol.  But then he wasn't acting right so they called 911.  He was discharged from [facility] on Friday and did okay over the weekend.  His wife called the doctor yesterday to ask if he could be seen because she had noticed a subjective fever.  He had an appointment for today but he didn't make it to that appointment.  Fever started on Sunday, subjective; subjectively higher today.  She felt like he wasn't responding like he usually doses today.  Recent CVA, has R hemiparesis.  He was hospitalized from 7/30-8/6 and discharged to SNF rehab.  ED Course:  Tmax 104.5, BP low 100s.  Given IVF boluses and Vanc/Zosyn with Code Sepsis called.   1. Sepsis from proctitis - Treated with IV antibiotics and supportive care.  Lactic acid has trended down to normal range.  Improved clinically.   Urine culture was negative.  Blood cultures NGTD.  I ordered CT chest/abd/pelvis to  rule out occult infection and it revealed obstipation and proctitis.  De-escalated zosyn to doxycycline.   2. UTI - urine culture negative.   3. Recent CVA with residual right hemiparesis - Pt will need SNF per PT recommendations.    4. Essential Hypertension - stable on amlodipine daily.  5. Dyslipidemia - resume home lipitor daily.  6. Constipation and proctitis - stool softeners and doxycycline ordered. Complete doxycycline x 7 days then stop.  7. Dementia - likely vascular, stable.  8. Severe Protein Calorie Malnutrition - Dietitian consulted and appreciated.   9. Thrombocytopenia - stable.  10. Seizure disorder - resumed home keppra, reported to have occasional breakthrough seizures.  11. Anemia - suspect some hemodilution from all the fluids given, recheck CBC in 1 week.  12. Hyponatremia - resolved now with IVFs.   13. Controlled Afib - resumed home eliquis for anticoagulation, rate is controlled has pacemaker. 14. Elevated AST - hepatitis panel negative.   ALT has been normal. AST trending down.   DVT prophylaxis: eliquis  Code Status:DNR Family Communication:Wife at bedside  Disposition Plan:SNF   Discharge Diagnoses:  Principal Problem:   Sepsis ([place]) Active Problems:   Seizures ([place])   CVA (cerebral vascular accident) ([place])   Essential hypertension   Dementia   Severe protein-calorie malnutrition ([place])   Normocytic anemia   Hyperlipidemia   Thrombocytopenia ([facility])   Paroxysmal atrial fibrillation ([facility])   Pacemaker   Hyperglycemia   Elevated LFTs  Discharge Instructions: Discharge Instructions    Increase activity slowly  Complete by:  As directed      Allergies as of 05/31/2017      Reactions   Sulfa Antibiotics Hives   Sulfasalazine Hives      Medication List    STOP taking these medications   furosemide 20 MG tablet Commonly known as:  LASIX     TAKE these medications   amLODipine 2.5 MG tablet Commonly known as:  NORVASC Take 1 tablet (2.5  mg total) by mouth daily.   apixaban 5 MG Tabs tablet Commonly known as:  ELIQUIS Take 1 tablet (5 mg total) by mouth 2 (two) times daily.   atorvastatin 20 MG tablet Commonly known as:  LIPITOR Take 1 tablet (20 mg total) by mouth daily at 6 PM. What changed:  when to take this   docusate sodium 100 MG capsule Commonly known as:  COLACE Take 100 mg by mouth daily as needed for mild constipation or moderate constipation.   donepezil 10 MG tablet Commonly known as:  ARICEPT Take 1 tablet (10 mg total) by mouth every morning.   doxycycline 100 MG tablet Commonly known as:  VIBRA-TABS Take 1 tablet (100 mg total) by mouth every 12 (twelve) hours.   feeding supplement (ENSURE ENLIVE) Liqd Take 237 mLs by mouth 2 (two) times daily between meals. What changed:  when to take this   levETIRAcetam 500 MG tablet Commonly known as:  KEPPRA Take 500 mg by mouth 2 (two) times daily.   memantine 5 MG tablet Commonly known as:  NAMENDA Take 5 mg by mouth 2 (two) times daily.   multivitamin with minerals Tabs tablet Take 1 tablet by mouth daily.   MYRBETRIQ 25 MG Tb24 tablet Generic drug:  mirabegron ER Take 1 tablet by mouth daily.   polyethylene glycol packet Commonly known as:  MIRALAX / GLYCOLAX Take 17 g by mouth 2 (two) times daily.   traZODone 50 MG tablet Commonly known as:  DESYREL Take 50 mg by mouth 2 (two) times daily.            Discharge Care Instructions        Start     Ordered   06/01/17 0000  Multiple Vitamin (MULTIVITAMIN WITH MINERALS) TABS tablet  Daily     05/31/17 1103   05/31/17 0000  doxycycline (VIBRA-TABS) 100 MG tablet  Every 12 hours     05/31/17 1103   05/31/17 0000  polyethylene glycol (MIRALAX / GLYCOLAX) packet  2 times daily     05/31/17 1103   05/31/17 0000  Increase activity slowly     05/31/17 1103     Follow-up Information    Jodi Marble, MD. Schedule an appointment as soon as possible for a visit in 2 week(s).    Specialty:  Internal Medicine Contact information: [place] [address] [phone]          Allergies  Allergen Reactions  . Sulfa Antibiotics Hives  . Sulfasalazine Hives   Current Discharge Medication List    START taking these medications   Details  doxycycline (VIBRA-TABS) 100 MG tablet Take 1 tablet (100 mg total) by mouth every 12 (twelve) hours. Qty: 14 tablet, Refills: 0    Multiple Vitamin (MULTIVITAMIN WITH MINERALS) TABS tablet Take 1 tablet by mouth daily.    polyethylene glycol (MIRALAX / GLYCOLAX) packet Take 17 g by mouth 2 (two) times daily. Qty: 14 each, Refills: 0      CONTINUE these medications which have NOT CHANGED  Details  amLODipine (NORVASC) 2.5 MG tablet Take 1 tablet (2.5 mg total) by mouth daily. Qty: 30 tablet, Refills: 1    apixaban (ELIQUIS) 5 MG TABS tablet Take 1 tablet (5 mg total) by mouth 2 (two) times daily. Qty: 60 tablet, Refills: 11    atorvastatin (LIPITOR) 20 MG tablet Take 1 tablet (20 mg total) by mouth daily at 6 PM.    docusate sodium (COLACE) 100 MG capsule Take 100 mg by mouth daily as needed for mild constipation or moderate constipation.     donepezil (ARICEPT) 10 MG tablet Take 1 tablet (10 mg total) by mouth every morning. Qty: 30 tablet, Refills: 0    feeding supplement, ENSURE ENLIVE, (ENSURE ENLIVE) LIQD Take 237 mLs by mouth 2 (two) times daily between meals. Qty: 237 mL, Refills: 12    levETIRAcetam (KEPPRA) 500 MG tablet Take 500 mg by mouth 2 (two) times daily.    memantine (NAMENDA) 5 MG tablet Take 5 mg by mouth 2 (two) times daily.    MYRBETRIQ 25 MG TB24 tablet Take 1 tablet by mouth daily. Refills: 11    traZODone (DESYREL) 50 MG tablet Take 50 mg by mouth 2 (two) times daily.       STOP taking these medications     furosemide (LASIX) 20 MG tablet        Procedures/Studies: Dg Chest 2 View  Result Date: 05/16/2017 CLINICAL DATA:  Shortness of breath, unresponsive,  pacemaker, history hypertension, stroke, dementia, prostate cancer EXAM: CHEST  2 VIEW COMPARISON:  03/31/2017 FINDINGS: LEFT subclavian transvenous pacemaker with leads projecting at RIGHT atrium and RIGHT ventricle, unchanged. Enlargement of cardiac silhouette. Atherosclerotic calcification aorta. Mediastinal contours and pulmonary vascularity otherwise normal. LEFT basilar atelectasis, chronic. No definite infiltrate, pleural effusion or pneumothorax. Bones demineralized. IMPRESSION: Enlargement of cardiac silhouette with pacemaker. Chronic LEFT basilar atelectasis. Electronically Signed   By: Lavonia Dana M.D.   On: 05/16/2017 14:47   Ct Head Wo Contrast  Result Date: 05/16/2017 CLINICAL DATA:  History of seizures.  Old stroke. EXAM: CT HEAD WITHOUT CONTRAST TECHNIQUE: Contiguous axial images were obtained from the base of the skull through the vertex without intravenous contrast. COMPARISON:  03/31/2017 FINDINGS: Brain: No change since the previous study. Generalized atrophy. Chronic small-vessel ischemic changes affecting the brainstem, thalami, basal ganglia and cerebral hemispheric deep and subcortical white matter. Old parietal cortical and subcortical infarctions on both sides. No sign of acute or subacute infarction, mass lesion, hemorrhage, hydrocephalus or extra-axial collection. Vascular: There is atherosclerotic calcification of the major vessels at the base of the brain. Skull: Negative Sinuses/Orbits: Clear except for scattered ethmoid air cell opacification. Orbits negative. Other: None IMPRESSION: No acute finding by CT. Atrophy and chronic ischemic changes throughout the brain as outlined above. Electronically Signed   By: Nelson Chimes M.D.   On: 05/16/2017 14:51   Ct Chest W Contrast  Result Date: 05/30/2017 CLINICAL DATA:  Cough and fever. EXAM: CT CHEST, ABDOMEN, AND PELVIS WITH CONTRAST TECHNIQUE: Multidetector CT imaging of the chest, abdomen and pelvis was performed following the  standard protocol during bolus administration of intravenous contrast. CONTRAST:  40mL ISOVUE-300 IOPAMIDOL (ISOVUE-300) INJECTION 61%, 141mL ISOVUE-300 IOPAMIDOL (ISOVUE-300) INJECTION 61% COMPARISON:  10/13/2012 FINDINGS: CT CHEST FINDINGS Cardiovascular: The heart is mildly enlarged. Patient has a left-sided transvenous pacemaker with leads to the right atrium and right ventricle. There is extensive atherosclerotic calcification of the coronary arteries. Trace pericardial effusion. There is atherosclerotic calcification of the thoracic aortic arch. No  associated aneurysm. The appearance of the pulmonary arteries is within normal limits accounting for the contrast bolus timing. Mediastinum/Nodes: The visualized portion of the thyroid gland has a normal appearance. Small mediastinal lymph nodes are likely reactive. The esophagus is normal in appearance. Lungs/Pleura: There are reticular changes within the lung bases bilaterally, left greater than right. Trace bilateral pleural effusions. There is a small amount of mucus within the airways, raising the question of aspiration of secretions. Musculoskeletal: Lower thoracic diffuse idiopathic skeletal hyperostosis. No suspicious lytic or blastic lesions. CT ABDOMEN PELVIS FINDINGS Hepatobiliary: The liver is homogeneous. No focal liver lesions. Gallbladder is contracted. Pancreas: Unremarkable. No pancreatic ductal dilatation or surrounding inflammatory changes. Spleen: Normal in size without focal abnormality. Adrenals/Urinary Tract: Adrenal glands are normal. There are multiple bilateral renal cysts. No suspicious renal mass or hydronephrosis. Urinary bladder is decompressed. Stomach/Bowel: The stomach and small bowel loops are normal in appearance. There is a large amount of stool throughout the loops of colon. The rectal wall appears mildly thickened, raising the question of proctitis. The appendix is not well seen. Vascular/Lymphatic: There is atherosclerotic  calcification of the abdominal aorta, not associated with aneurysm. No retroperitoneal or mesenteric adenopathy. There is normal vascular opacification of the celiac axis, superior mesenteric artery, and inferior mesenteric artery. Normal appearance of the portal venous system and inferior vena cava. Reproductive: Prostate is unremarkable. Other: There is diffuse edema of the subcutaneous tissues. No ascites. Anterior abdominal wall is unremarkable. Symmetric bilateral gynecomastia. Musculoskeletal: Degenerative changes are seen in the lower lumbar spine, particularly at L4-5 and L5-S1. No suspicious lytic or blastic lesions are identified. IMPRESSION: 1. Mild reticular changes in the lung bases associated with small effusions possibly infectious or inflammatory. Question of aspiration of secretions. 2. Cardiomegaly and coronary artery disease. 3.  Aortic atherosclerosis.  (ICD10-I70.0) 4. Large stool burden. 5. Circumferential thickening of the rectal wall raising the question of proctitis. No asymmetric or obstructing rectal lesion identified. 6. Multiple bilateral renal cysts are stable. 7. Body wall edema. 8. Gynecomastia. Electronically Signed   By: Nolon Nations M.D.   On: 05/30/2017 15:08   Ct Abdomen Pelvis W Contrast  Result Date: 05/30/2017 CLINICAL DATA:  Cough and fever. EXAM: CT CHEST, ABDOMEN, AND PELVIS WITH CONTRAST TECHNIQUE: Multidetector CT imaging of the chest, abdomen and pelvis was performed following the standard protocol during bolus administration of intravenous contrast. CONTRAST:  18mL ISOVUE-300 IOPAMIDOL (ISOVUE-300) INJECTION 61%, 171mL ISOVUE-300 IOPAMIDOL (ISOVUE-300) INJECTION 61% COMPARISON:  10/13/2012 FINDINGS: CT CHEST FINDINGS Cardiovascular: The heart is mildly enlarged. Patient has a left-sided transvenous pacemaker with leads to the right atrium and right ventricle. There is extensive atherosclerotic calcification of the coronary arteries. Trace pericardial effusion.  There is atherosclerotic calcification of the thoracic aortic arch. No associated aneurysm. The appearance of the pulmonary arteries is within normal limits accounting for the contrast bolus timing. Mediastinum/Nodes: The visualized portion of the thyroid gland has a normal appearance. Small mediastinal lymph nodes are likely reactive. The esophagus is normal in appearance. Lungs/Pleura: There are reticular changes within the lung bases bilaterally, left greater than right. Trace bilateral pleural effusions. There is a small amount of mucus within the airways, raising the question of aspiration of secretions. Musculoskeletal: Lower thoracic diffuse idiopathic skeletal hyperostosis. No suspicious lytic or blastic lesions. CT ABDOMEN PELVIS FINDINGS Hepatobiliary: The liver is homogeneous. No focal liver lesions. Gallbladder is contracted. Pancreas: Unremarkable. No pancreatic ductal dilatation or surrounding inflammatory changes. Spleen: Normal in size without focal abnormality. Adrenals/Urinary Tract:  Adrenal glands are normal. There are multiple bilateral renal cysts. No suspicious renal mass or hydronephrosis. Urinary bladder is decompressed. Stomach/Bowel: The stomach and small bowel loops are normal in appearance. There is a large amount of stool throughout the loops of colon. The rectal wall appears mildly thickened, raising the question of proctitis. The appendix is not well seen. Vascular/Lymphatic: There is atherosclerotic calcification of the abdominal aorta, not associated with aneurysm. No retroperitoneal or mesenteric adenopathy. There is normal vascular opacification of the celiac axis, superior mesenteric artery, and inferior mesenteric artery. Normal appearance of the portal venous system and inferior vena cava. Reproductive: Prostate is unremarkable. Other: There is diffuse edema of the subcutaneous tissues. No ascites. Anterior abdominal wall is unremarkable. Symmetric bilateral gynecomastia.  Musculoskeletal: Degenerative changes are seen in the lower lumbar spine, particularly at L4-5 and L5-S1. No suspicious lytic or blastic lesions are identified. IMPRESSION: 1. Mild reticular changes in the lung bases associated with small effusions possibly infectious or inflammatory. Question of aspiration of secretions. 2. Cardiomegaly and coronary artery disease. 3.  Aortic atherosclerosis.  (ICD10-I70.0) 4. Large stool burden. 5. Circumferential thickening of the rectal wall raising the question of proctitis. No asymmetric or obstructing rectal lesion identified. 6. Multiple bilateral renal cysts are stable. 7. Body wall edema. 8. Gynecomastia. Electronically Signed   By: Nolon Nations M.D.   On: 05/30/2017 15:08   Dg Chest Port 1 View  Result Date: 05/28/2017 CLINICAL DATA:  Fever.  History of prostate carcinoma EXAM: PORTABLE CHEST 1 VIEW COMPARISON:  May 27, 2017 FINDINGS: There is cardiomegaly. Pacemaker leads are attached to the right atrium and right ventricle. Pulmonary vascularity appears within normal limits. No pneumothorax. There is no edema or consolidation. There is aortic atherosclerosis. No evident adenopathy. No evident bone lesions. IMPRESSION: Stable cardiomegaly. There is aortic atherosclerosis. No edema or consolidation. Aortic Atherosclerosis (ICD10-I70.0). Electronically Signed   By: Lowella Grip III M.D.   On: 05/28/2017 07:50   Dg Chest Port 1 View  Result Date: 05/27/2017 CLINICAL DATA:  Cough and congestion with fever EXAM: PORTABLE CHEST 1 VIEW COMPARISON:  May 16, 2017 FINDINGS: There is no edema or consolidation. Heart is enlarged with pulmonary vascularity within normal limits. Pacemaker leads are attached to the right atrium and right ventricle. There is aortic atherosclerosis. No adenopathy. No evident bone lesions. IMPRESSION: Stable cardiomegaly. Pacemaker leads attached to right atrium and right ventricle. No edema or consolidation. There is aortic  atherosclerosis. Aortic Atherosclerosis (ICD10-I70.0). Electronically Signed   By: Lowella Grip III M.D.   On: 05/27/2017 15:02      Subjective: Pt much more alert, eating and drinking well and answering questions appropriately.  No complaints.   Discharge Exam: Vitals:   05/31/17 0734 05/31/17 0918  BP: 130/77   Pulse: 64   Resp: 18   Temp: 97.7 F (36.5 C)   SpO2: 100% 96%   Vitals:   05/30/17 1530 05/30/17 1900 05/31/17 0734 05/31/17 0918  BP: 126/77 122/81 130/77   Pulse: 75 (!) 110 64   Resp: 18 18 18    Temp: 98.4 F (36.9 C) 97.8 F (36.6 C) 97.7 F (36.5 C)   TempSrc: Oral Oral Axillary   SpO2: 100% 94% 100% 96%  Weight:        General: Pt is alert, awake, not in acute distress Cardiovascular: RRR, S1/S2 +, no rubs, no gallops Respiratory: CTA bilaterally, no wheezing, no rhonchi Abdominal: Soft, NT, ND, bowel sounds + Extremities: no edema, no cyanosis  The results of significant diagnostics from this hospitalization (including imaging, microbiology, ancillary and laboratory) are listed below for reference.     Microbiology: Recent Results (from the past 240 hour(s))  Culture, blood (routine x 2)     Status: None (Preliminary result)   Collection Time: 05/27/17  2:43 PM  Result Value Ref Range Status   Specimen Description BLOOD RIGHT ANTECUBITAL  Final   Special Requests   Final    BOTTLES DRAWN AEROBIC AND ANAEROBIC Blood Culture adequate volume   Culture NO GROWTH 4 DAYS  Final   Report Status PENDING  Incomplete  Culture, blood (routine x 2)     Status: None (Preliminary result)   Collection Time: 05/27/17  2:43 PM  Result Value Ref Range Status   Specimen Description BLOOD LEFT ANTECUBITAL  Final   Special Requests   Final    BOTTLES DRAWN AEROBIC ONLY Blood Culture results may not be optimal due to an inadequate volume of blood received in culture bottles   Culture NO GROWTH 4 DAYS  Final   Report Status PENDING  Incomplete  Urine culture      Status: None   Collection Time: 05/27/17  5:10 PM  Result Value Ref Range Status   Specimen Description URINE, CLEAN CATCH  Final   Special Requests NONE  Final   Culture   Final    NO GROWTH Performed at [facility], [place] [address], [place], [place] [address]    Report Status 05/29/2017 FINAL  Final  MRSA PCR Screening     Status: None   Collection Time: 05/28/17  1:56 AM  Result Value Ref Range Status   MRSA by PCR NEGATIVE NEGATIVE Final    Comment:        The GeneXpert MRSA Assay (FDA approved for NASAL specimens only), is one component of a comprehensive MRSA colonization surveillance program. It is not intended to diagnose MRSA infection nor to guide or monitor treatment for MRSA infections.      Labs: BNP (last 3 results) No results for input(s): BNP in the last 8760 hours. Basic Metabolic Panel:  Recent Labs Lab 05/27/17 1432 05/28/17 0503 05/29/17 0610 05/30/17 0501 05/31/17 0606  NA 127* 131* 136 137 139  K 4.5 3.9 3.8 3.6 3.6  CL 97* 102 106 106 108  CO2 22 21* 24 24 26   GLUCOSE 140* 102* 98 91 104*  BUN 22* 23* 28* 22* 19  CREATININE 1.10 1.02 1.14 0.88 0.78  CALCIUM 7.7* 7.3* 7.7* 7.6* 7.6*   Liver Function Tests:  Recent Labs Lab 05/27/17 1432 05/28/17 0503 05/29/17 0610 05/30/17 0501 05/31/17 0606  AST 155* 139* 140* 115* 80*  ALT 43 40 52 52 46  ALKPHOS 42 34* 46 46 47  BILITOT 0.8 0.8 0.8 0.8 0.5  PROT 6.8 5.6* 6.0* 5.7* 5.5*  ALBUMIN 2.1* 1.7* 1.8* 1.7* 1.7*   No results for input(s): LIPASE, AMYLASE in the last 168 hours. No results for input(s): AMMONIA in the last 168 hours. CBC:  Recent Labs Lab 05/27/17 1432 05/28/17 0503 05/29/17 0610 05/30/17 0501 05/31/17 0606  WBC 10.1 9.8 11.1* 7.3 5.7  NEUTROABS 8.7*  --  9.1* 5.8 4.0  HGB 10.2* 8.9* 9.7* 8.5* 7.8*  HCT 28.4* 25.7* 27.7* 24.5* 22.1*  MCV 84.3 84.5 85.0 84.5 85.0  PLT 135* 125* 113* 129* 145*   Cardiac Enzymes: No results for input(s): CKTOTAL,  CKMB, CKMBINDEX, TROPONINI in the last 168 hours. BNP: Invalid input(s): POCBNP CBG:  No results for input(s): GLUCAP in the last 168 hours. D-Dimer No results for input(s): DDIMER in the last 72 hours. Hgb A1c No results for input(s): HGBA1C in the last 72 hours. Lipid Profile No results for input(s): CHOL, HDL, LDLCALC, TRIG, CHOLHDL, LDLDIRECT in the last 72 hours. Thyroid function studies No results for input(s): TSH, T4TOTAL, T3FREE, THYROIDAB in the last 72 hours.  Invalid input(s): FREET3 Anemia work up No results for input(s): VITAMINB12, FOLATE, FERRITIN, TIBC, IRON, RETICCTPCT in the last 72 hours. Urinalysis    Component Value Date/Time   COLORURINE YELLOW 05/27/2017 1710   APPEARANCEUR CLOUDY (A) 05/27/2017 1710   APPEARANCEUR Hazy 02/02/2014 1501   LABSPEC 1.018 05/27/2017 1710   LABSPEC 1.023 02/02/2014 1501   PHURINE 5.0 05/27/2017 1710   GLUCOSEU NEGATIVE 05/27/2017 1710   GLUCOSEU Negative 02/02/2014 1501   HGBUR LARGE (A) 05/27/2017 1710   BILIRUBINUR NEGATIVE 05/27/2017 1710   BILIRUBINUR Negative 02/02/2014 1501   KETONESUR NEGATIVE 05/27/2017 1710   PROTEINUR 100 (A) 05/27/2017 1710   NITRITE NEGATIVE 05/27/2017 1710   LEUKOCYTESUR NEGATIVE 05/27/2017 1710   LEUKOCYTESUR Negative 02/02/2014 1501   Sepsis Labs Invalid input(s): PROCALCITONIN,  WBC,  LACTICIDVEN Microbiology Recent Results (from the past 240 hour(s))  Culture, blood (routine x 2)     Status: None (Preliminary result)   Collection Time: 05/27/17  2:43 PM  Result Value Ref Range Status   Specimen Description BLOOD RIGHT ANTECUBITAL  Final   Special Requests   Final    BOTTLES DRAWN AEROBIC AND ANAEROBIC Blood Culture adequate volume   Culture NO GROWTH 4 DAYS  Final   Report Status PENDING  Incomplete  Culture, blood (routine x 2)     Status: None (Preliminary result)   Collection Time: 05/27/17  2:43 PM  Result Value Ref Range Status   Specimen Description BLOOD LEFT ANTECUBITAL   Final   Special Requests   Final    BOTTLES DRAWN AEROBIC ONLY Blood Culture results may not be optimal due to an inadequate volume of blood received in culture bottles   Culture NO GROWTH 4 DAYS  Final   Report Status PENDING  Incomplete  Urine culture     Status: None   Collection Time: 05/27/17  5:10 PM  Result Value Ref Range Status   Specimen Description URINE, CLEAN CATCH  Final   Special Requests NONE  Final   Culture   Final    NO GROWTH Performed at [facility], [place] [address], [place], [place] [address]    Report Status 05/29/2017 FINAL  Final  MRSA PCR Screening     Status: None   Collection Time: 05/28/17  1:56 AM  Result Value Ref Range Status   MRSA by PCR NEGATIVE NEGATIVE Final    Comment:        The GeneXpert MRSA Assay (FDA approved for NASAL specimens only), is one component of a comprehensive MRSA colonization surveillance program. It is not intended to diagnose MRSA infection nor to guide or monitor treatment for MRSA infections.    Time coordinating discharge: 36 mins  SIGNED:  Irwin Brakeman, MD  [facility] 05/31/2017, 11:04 AM Pager [phone]  If 7PM-7AM, please contact night-coverage [email or web address] Password [email or web address]

## 2017-05-31 NOTE — Progress Notes (Signed)
05/31/2017 11:55 AM  Social worker is telling us that patient will not have authorization to go to SNF by BC/BS for 2 more days, will need to discharge home with Home health services PT, RN, Aide and Social work and can pursue SNF placement outside of hospital.   Murvin Natal MD

## 2017-05-31 NOTE — Clinical Social Work Note (Signed)
CSW notified by [facility] that Spouse does not want to take pt home now, wants SNF. CSW advised [facility] pt's ins requires auth. CSW staffed case with Asst. Dir, pt likely will not meet criteria for LOG. Asst Dir contacted [facility] to discuss pt's baseline and was told the family has now decided to take pt home.  [place] services have already been established by [facility]. No other DC needs identified.  Teddy Pena B. Joline Maxcy Clinical Social Work Dept Weekend Social Worker [phone] 1:09 PM

## 2017-06-01 LAB — CULTURE, BLOOD (ROUTINE X 2)
Culture: NO GROWTH
Culture: NO GROWTH
Special Requests: ADEQUATE

## 2017-06-11 ENCOUNTER — Other Ambulatory Visit: Payer: Medicare Other

## 2017-06-30 ENCOUNTER — Emergency Department: Payer: Medicare Other

## 2017-06-30 ENCOUNTER — Observation Stay
Admission: EM | Admit: 2017-06-30 | Discharge: 2017-07-02 | Disposition: A | Discharge status: Home / Self Care | Payer: Medicare Other | Attending: Internal Medicine | Admitting: Internal Medicine

## 2017-06-30 ENCOUNTER — Encounter: Payer: Self-pay | Admitting: Emergency Medicine

## 2017-06-30 DIAGNOSIS — I482 Chronic atrial fibrillation: Secondary | ICD-10-CM | POA: Insufficient documentation

## 2017-06-30 DIAGNOSIS — Z79899 Other long term (current) drug therapy: Secondary | ICD-10-CM | POA: Insufficient documentation

## 2017-06-30 DIAGNOSIS — Z7901 Long term (current) use of anticoagulants: Secondary | ICD-10-CM | POA: Insufficient documentation

## 2017-06-30 DIAGNOSIS — Z950 Presence of cardiac pacemaker: Secondary | ICD-10-CM | POA: Diagnosis not present

## 2017-06-30 DIAGNOSIS — R4182 Altered mental status, unspecified: Secondary | ICD-10-CM

## 2017-06-30 DIAGNOSIS — I959 Hypotension, unspecified: Secondary | ICD-10-CM | POA: Diagnosis not present

## 2017-06-30 DIAGNOSIS — N390 Urinary tract infection, site not specified: Secondary | ICD-10-CM | POA: Diagnosis not present

## 2017-06-30 DIAGNOSIS — G40909 Epilepsy, unspecified, not intractable, without status epilepticus: Secondary | ICD-10-CM | POA: Insufficient documentation

## 2017-06-30 DIAGNOSIS — M1990 Unspecified osteoarthritis, unspecified site: Secondary | ICD-10-CM | POA: Diagnosis not present

## 2017-06-30 DIAGNOSIS — Z7982 Long term (current) use of aspirin: Secondary | ICD-10-CM | POA: Insufficient documentation

## 2017-06-30 DIAGNOSIS — Z8673 Personal history of transient ischemic attack (TIA), and cerebral infarction without residual deficits: Secondary | ICD-10-CM | POA: Insufficient documentation

## 2017-06-30 DIAGNOSIS — F0390 Unspecified dementia without behavioral disturbance: Secondary | ICD-10-CM | POA: Insufficient documentation

## 2017-06-30 DIAGNOSIS — Z8546 Personal history of malignant neoplasm of prostate: Secondary | ICD-10-CM | POA: Diagnosis not present

## 2017-06-30 DIAGNOSIS — L8990 Pressure ulcer of unspecified site, unspecified stage: Secondary | ICD-10-CM | POA: Insufficient documentation

## 2017-06-30 DIAGNOSIS — I10 Essential (primary) hypertension: Secondary | ICD-10-CM | POA: Insufficient documentation

## 2017-06-30 LAB — COMPREHENSIVE METABOLIC PANEL
ALBUMIN: 2.3 g/dL — AB (ref 3.5–5.0)
ALT: 12 U/L — AB (ref 17–63)
AST: 27 U/L (ref 15–41)
Alkaline Phosphatase: 46 U/L (ref 38–126)
Anion gap: 7 (ref 5–15)
BUN: 15 mg/dL (ref 6–20)
CALCIUM: 8 mg/dL — AB (ref 8.9–10.3)
CHLORIDE: 107 mmol/L (ref 101–111)
CO2: 24 mmol/L (ref 22–32)
CREATININE: 0.81 mg/dL (ref 0.61–1.24)
GFR calc Af Amer: >60 mL/min (ref >60)
GLUCOSE: 113 mg/dL — AB (ref 65–99)
POTASSIUM: 3.8 mmol/L (ref 3.5–5.1)
SODIUM: 138 mmol/L (ref 135–145)
TOTAL PROTEIN: 6.5 g/dL (ref 6.5–8.1)
Total Bilirubin: 0.4 mg/dL (ref 0.3–1.2)

## 2017-06-30 LAB — CBC WITH DIFFERENTIAL/PLATELET
Basophils Absolute: 0 10*3/uL (ref 0–0.1)
Basophils Relative: 1 %
EOS ABS: 0.1 10*3/uL (ref 0–0.7)
EOS PCT: 2 %
HCT: 28.6 % — ABNORMAL LOW (ref 40.0–52.0)
Hemoglobin: 9.5 g/dL — ABNORMAL LOW (ref 13.0–18.0)
LYMPHS ABS: 0.5 10*3/uL — AB (ref 1.0–3.6)
LYMPHS PCT: 15 %
MCH: 29.6 pg (ref 26.0–34.0)
MCHC: 33.2 g/dL (ref 32.0–36.0)
MCV: 89.1 fL (ref 80.0–100.0)
MONO ABS: 0.3 10*3/uL (ref 0.2–1.0)
Monocytes Relative: 8 %
Neutro Abs: 2.7 10*3/uL (ref 1.4–6.5)
Neutrophils Relative %: 74 %
PLATELETS: 129 10*3/uL — AB (ref 150–440)
RBC: 3.2 MIL/uL — ABNORMAL LOW (ref 4.40–5.90)
RDW: 16.9 % — AB (ref 11.5–14.5)
WBC: 3.6 10*3/uL — ABNORMAL LOW (ref 3.8–10.6)

## 2017-06-30 LAB — TSH: TSH: 3.031 u[IU]/mL (ref 0.350–4.500)

## 2017-06-30 LAB — ACETAMINOPHEN LEVEL: Acetaminophen (Tylenol), Serum: <10 ug/mL — ABNORMAL LOW (ref 10–30)

## 2017-06-30 LAB — TROPONIN I: Troponin I: <0.03 ng/mL (ref <0.03)

## 2017-06-30 MED ORDER — DOCUSATE SODIUM 100 MG PO CAPS: 100.0000 mg | ORAL_CAPSULE | Freq: Every day | ORAL | Status: DC | PRN

## 2017-06-30 MED ORDER — LUBIPROSTONE 24 MCG PO CAPS
24.0000 ug | ORAL_CAPSULE | Freq: Two times a day (BID) | ORAL | Status: DC
  Administered 2017-06-30 – 2017-07-02 (×4): 24 ug via ORAL
  Filled 2017-06-30 (×5): qty 1

## 2017-06-30 MED ORDER — ACETAMINOPHEN 325 MG PO TABS: 650.0000 mg | ORAL_TABLET | Freq: Four times a day (QID) | ORAL | Status: DC | PRN

## 2017-06-30 MED ORDER — ADULT MULTIVITAMIN W/MINERALS CH
1.0000 | ORAL_TABLET | Freq: Every day | ORAL | Status: DC
  Administered 2017-07-01 – 2017-07-02 (×2): 1 via ORAL
  Filled 2017-06-30 (×2): qty 1

## 2017-06-30 MED ORDER — LEVETIRACETAM 500 MG PO TABS
500.0000 mg | ORAL_TABLET | Freq: Two times a day (BID) | ORAL | Status: DC
  Administered 2017-06-30 – 2017-07-02 (×4): 500 mg via ORAL
  Filled 2017-06-30 (×5): qty 1

## 2017-06-30 MED ORDER — POLYETHYLENE GLYCOL 3350 17 G PO PACK
17.0000 g | PACK | Freq: Two times a day (BID) | ORAL | Status: DC
  Administered 2017-06-30 – 2017-07-02 (×3): 17 g via ORAL
  Filled 2017-06-30 (×4): qty 1

## 2017-06-30 MED ORDER — APIXABAN 5 MG PO TABS
5.0000 mg | ORAL_TABLET | Freq: Two times a day (BID) | ORAL | Status: DC
  Administered 2017-06-30 – 2017-07-02 (×4): 5 mg via ORAL
  Filled 2017-06-30 (×4): qty 1

## 2017-06-30 MED ORDER — AMLODIPINE BESYLATE 5 MG PO TABS
2.5000 mg | ORAL_TABLET | Freq: Every day | ORAL | Status: DC
  Administered 2017-07-01 – 2017-07-02 (×2): 2.5 mg via ORAL
  Filled 2017-06-30 (×2): qty 1

## 2017-06-30 MED ORDER — MEMANTINE HCL 5 MG PO TABS
5.0000 mg | ORAL_TABLET | Freq: Two times a day (BID) | ORAL | Status: DC
  Administered 2017-06-30 – 2017-07-02 (×4): 5 mg via ORAL
  Filled 2017-06-30 (×4): qty 1

## 2017-06-30 MED ORDER — ACETAMINOPHEN 650 MG RE SUPP: 650.0000 mg | Freq: Four times a day (QID) | RECTAL | Status: DC | PRN

## 2017-06-30 MED ORDER — DONEPEZIL HCL 10 MG PO TABS
10.0000 mg | ORAL_TABLET | Freq: Every morning | ORAL | Status: DC
  Administered 2017-07-01 – 2017-07-02 (×2): 10 mg via ORAL
  Filled 2017-06-30 (×2): qty 1

## 2017-06-30 MED ORDER — SENNOSIDES-DOCUSATE SODIUM 8.6-50 MG PO TABS: 1.0000 | ORAL_TABLET | Freq: Every evening | ORAL | Status: DC | PRN

## 2017-06-30 MED ORDER — SODIUM CHLORIDE 0.9 % IV SOLN
INTRAVENOUS | Status: DC
  Administered 2017-06-30: 17:00:00 via INTRAVENOUS

## 2017-06-30 MED ORDER — MIRABEGRON ER 25 MG PO TB24
25.0000 mg | ORAL_TABLET | Freq: Every day | ORAL | Status: DC
  Administered 2017-07-01 – 2017-07-02 (×2): 25 mg via ORAL
  Filled 2017-06-30 (×2): qty 1

## 2017-06-30 MED ORDER — ONDANSETRON HCL 4 MG PO TABS: 4.0000 mg | ORAL_TABLET | Freq: Four times a day (QID) | ORAL | Status: DC | PRN

## 2017-06-30 MED ORDER — ATORVASTATIN CALCIUM 20 MG PO TABS
20.0000 mg | ORAL_TABLET | Freq: Every morning | ORAL | Status: DC
  Administered 2017-07-01 – 2017-07-02 (×2): 20 mg via ORAL
  Filled 2017-06-30 (×2): qty 1

## 2017-06-30 MED ORDER — ENSURE ENLIVE PO LIQD
237.0000 mL | Freq: Two times a day (BID) | ORAL | Status: DC
  Administered 2017-06-30 – 2017-07-01 (×3): 237 mL via ORAL

## 2017-06-30 MED ORDER — ASPIRIN EC 81 MG PO TBEC
81.0000 mg | DELAYED_RELEASE_TABLET | Freq: Every day | ORAL | Status: DC
  Administered 2017-07-01 – 2017-07-02 (×2): 81 mg via ORAL
  Filled 2017-06-30 (×2): qty 1

## 2017-06-30 MED ORDER — SODIUM CHLORIDE 0.9 % IV SOLN
Freq: Once | INTRAVENOUS | Status: AC
  Administered 2017-06-30: 15:00:00 via INTRAVENOUS

## 2017-06-30 MED ORDER — ONDANSETRON HCL 4 MG/2ML IJ SOLN: 4.0000 mg | Freq: Four times a day (QID) | INTRAMUSCULAR | Status: DC | PRN

## 2017-06-30 MED ORDER — HYDROCODONE-ACETAMINOPHEN 5-325 MG PO TABS: 1.0000 | ORAL_TABLET | ORAL | Status: DC | PRN

## 2017-06-30 MED ORDER — TRAZODONE HCL 50 MG PO TABS
50.0000 mg | ORAL_TABLET | Freq: Two times a day (BID) | ORAL | Status: DC
  Administered 2017-06-30 – 2017-07-02 (×4): 50 mg via ORAL
  Filled 2017-06-30 (×4): qty 1

## 2017-06-30 NOTE — ED Triage Notes (Signed)
Arrives from [place] [facility]  with c/o lethargy and hypotension.  Patient recently treated as an inpatietn at [facility] for lethargy and anemia.  Patient's wife states she has noticed a return of lethargy x 1 day.  Was at [facility] today for a follow up visit to recent hospitalization.

## 2017-06-30 NOTE — H&P (Signed)
[facility] [place] NAME: Kenneth Vazquez    MR#:  [id number]  DATE OF BIRTH:  [birth year]  DATE OF ADMISSION:  06/30/2017  PRIMARY CARE PHYSICIAN: Jodi Marble, MD   REQUESTING/REFERRING PHYSICIAN: dr Cinda Quest  CHIEF COMPLAINT:   hypontension from PCP office HISTORY OF PRESENT ILLNESS:  Kenneth Vazquez  is a 76 y.o. male with a known history of atrial fibrillation, dementia, seizures and CVA who presents from PCP office due to hypotension.  Patient saw his primary care physician as a routine follow-up and his blood pressure systolic was negative.  He was recently discharged from a hospital in [place] due to anemia and dehydration.  His wife reports over the past few days she has noticed that he has some increasing lethargy.  At baseline patient is bedbound and does have episodes of lethargy.  His dementia is moderate.  He is followed by Dr. Manuella Ghazi from neurology for his dementia and seizures.  When he arrived to the ER his blood pressure systolic was in the 193X without any fluids.   PAST MEDICAL HISTORY:   Past Medical History:  Diagnosis Date  . Arthritis   . Carotid artery disease ([place])   . Dementia   . Hypertension   . Pacemaker   . Prostate cancer ([place])   . Seizures ([place])    last seizure was a while ago  . Stroke [facility]) x2, 2015   residual weakness, wife is unsure which side  Atrial fibrillation, chronic  PAST SURGICAL HISTORY:   Past Surgical History:  Procedure Laterality Date  . CAROTID STENT    . left total knee replacement    . PACEMAKER INSERTION    . PROSTATE CRYOABLATION      SOCIAL HISTORY:   Social History  Substance Use Topics  . Smoking status: Never Smoker  . Smokeless tobacco: Never Used  . Alcohol use No     Comment: h/o heavy use, no use in recent years    FAMILY HISTORY:   Family History  Problem Relation Age of Onset  . CVA Mother     DRUG ALLERGIES:   Allergies  Allergen Reactions  .  Sulfa Antibiotics Hives  . Sulfasalazine Hives    REVIEW OF SYSTEMS:   Review of Systems  Constitutional: Positive for malaise/fatigue. Negative for chills and fever.  HENT: Negative.  Negative for ear discharge, ear pain, hearing loss, nosebleeds and sore throat.   Eyes: Negative.  Negative for blurred vision and pain.  Respiratory: Negative.  Negative for cough, hemoptysis, shortness of breath and wheezing.   Cardiovascular: Negative.  Negative for chest pain, palpitations and leg swelling.  Gastrointestinal: Negative.  Negative for abdominal pain, blood in stool, diarrhea, nausea and vomiting.  Genitourinary: Negative.  Negative for dysuria.  Musculoskeletal: Negative.  Negative for back pain.  Skin: Negative.   Neurological: Positive for weakness. Negative for dizziness, tremors, speech change, focal weakness, seizures and headaches.  Endo/Heme/Allergies: Negative.  Does not bruise/bleed easily.  Psychiatric/Behavioral: Positive for memory loss. Negative for depression, hallucinations and suicidal ideas.    MEDICATIONS AT HOME:   Prior to Admission medications   Medication Sig Start Date End Date Taking? Authorizing Provider  amLODipine (NORVASC) 2.5 MG tablet Take 1 tablet (2.5 mg total) by mouth daily. 04/08/17  Yes Hosie Poisson, MD  apixaban (ELIQUIS) 5 MG TABS tablet Take 1 tablet (5 mg total) by mouth 2 (two) times daily. 05/12/17  Yes Nahser, Wonda Cheng, MD  aspirin EC 81 MG tablet Take 81 mg by mouth daily.   Yes [provider]  atorvastatin (LIPITOR) 20 MG tablet Take 1 tablet (20 mg total) by mouth daily at 6 PM. Patient taking differently: Take 20 mg by mouth every morning.  04/07/17  Yes Hosie Poisson, MD  donepezil (ARICEPT) 10 MG tablet Take 1 tablet (10 mg total) by mouth every morning. 11/30/16  Yes Hosie Poisson, MD  levETIRAcetam (KEPPRA) 500 MG tablet Take 500 mg by mouth 2 (two) times daily.   Yes [provider]  lubiprostone (AMITIZA) 24 MCG  capsule Take 24 mcg by mouth 2 (two) times daily with a meal.   Yes [provider]  memantine (NAMENDA) 5 MG tablet Take 5 mg by mouth 2 (two) times daily. 04/15/17 04/15/18 Yes [provider]  Multiple Vitamin (MULTIVITAMIN WITH MINERALS) TABS tablet Take 1 tablet by mouth daily. 06/01/17  Yes Johnson, Clanford L, MD  MYRBETRIQ 25 MG TB24 tablet Take 1 tablet by mouth daily. 11/13/16  Yes [provider]  traZODone (DESYREL) 50 MG tablet Take 50 mg by mouth 2 (two) times daily.    Yes [provider]  docusate sodium (COLACE) 100 MG capsule Take 100 mg by mouth daily as needed for mild constipation or moderate constipation.     [provider]  feeding supplement, ENSURE ENLIVE, (ENSURE ENLIVE) LIQD Take 237 mLs by mouth 2 (two) times daily between meals. Patient taking differently: Take 237 mLs by mouth 2 (two) times daily.  04/07/17   Hosie Poisson, MD  polyethylene glycol (MIRALAX / GLYCOLAX) packet Take 17 g by mouth 2 (two) times daily. 05/31/17   Johnson, Clanford L, MD      VITAL SIGNS:  Blood pressure 137/78, pulse 98, temperature 97.8 F (36.6 C), temperature source Oral, resp. rate 14, height 5\' 11"  (1.803 m), weight 68.2 kg (150 lb 4.8 oz).  PHYSICAL EXAMINATION:   Physical Exam  Constitutional: He is well-developed, well-nourished, and in no distress. No distress.  HENT:  Head: Normocephalic.  Eyes: No scleral icterus.  Neck: Normal range of motion. Neck supple. No JVD present. No tracheal deviation present.  Cardiovascular: Normal rate, regular rhythm and normal heart sounds.  Exam reveals no gallop and no friction rub.   No murmur heard. Pulmonary/Chest: Effort normal and breath sounds normal. No respiratory distress. He has no wheezes. He has no rales. He exhibits no tenderness.  Abdominal: Soft. Bowel sounds are normal. He exhibits no distension and no mass. There is no tenderness. There is no rebound and no guarding.   Musculoskeletal: He exhibits no edema or tenderness.  Globally weak  Neurological:  Oriented to name not place or time  He is awake  Somewhat lethargic but answers questions  Skin: Skin is warm. No rash noted. No erythema.  Psychiatric:  Dementia      LABORATORY PANEL:   CBC  Recent Labs Lab 06/30/17 1308  WBC 3.6*  HGB 9.5*  HCT 28.6*  PLT 129*   ------------------------------------------------------------------------------------------------------------------  Chemistries   Recent Labs Lab 06/30/17 1308  NA 138  K 3.8  CL 107  CO2 24  GLUCOSE 113*  BUN 15  CREATININE 0.81  CALCIUM 8.0*  AST 27  ALT 12*  ALKPHOS 46  BILITOT 0.4   ------------------------------------------------------------------------------------------------------------------  Cardiac Enzymes  Recent Labs Lab 06/30/17 1308  TROPONINI <0.03   ------------------------------------------------------------------------------------------------------------------  RADIOLOGY:  Ct Head Wo Contrast  Result Date: 06/30/2017 CLINICAL DATA:  Lethargy and hypotension. EXAM:  CT HEAD WITHOUT CONTRAST TECHNIQUE: Contiguous axial images were obtained from the base of the skull through the vertex without intravenous contrast. COMPARISON:  May 16, 2017 FINDINGS: Brain: Moderate diffuse atrophy is stable. There is no intracranial mass, hemorrhage, extra-axial fluid collection, or midline shift. There is small vessel disease throughout the centra semiovale bilaterally. Patchy small vessel disease is noted in each basal ganglia region, stable. There is evidence of a prior focal infarct in the left mid to lower pons. There is a small lacunar type infarct in the superior cerebellum on the left. No acute infarct is demonstrated on this study. Vascular: There is no hyperdense vessel. There is calcification in both carotid siphon regions. Skull: The bony calvarium appears intact. Sinuses/Orbits: There is  opacification of several ethmoid air cells bilaterally. Other visualized paranasal sinuses are clear. Visualized orbits appear symmetric bilaterally. Other: Visualized mastoid air cells are clear. IMPRESSION: 1. Atrophy with extensive supratentorial small vessel disease. Prior focal infarct left mid to lower pons. Small lacunar in large superior left cerebellum. No acute infarct is demonstrated on this study. No intracranial mass, hemorrhage, or extra-axial fluid collection. 2.  There are foci of arterial vascular calcification. 3.  Areas of ethmoid sinus disease noted. Electronically Signed   By: Lowella Grip III M.D.   On: 06/30/2017 12:49   Dg Chest Portable 1 View  Result Date: 06/30/2017 CLINICAL DATA:  Lethargy and hypotension. EXAM: PORTABLE CHEST 1 VIEW COMPARISON:  CT chest 05/30/2017 and chest radiograph 05/28/2017. FINDINGS: Trachea is midline. Heart size stable. Thoracic aorta is calcified. Pacemaker lead tips project over the right atrium and right ventricle. There may be minimal left basilar atelectasis. Lungs are otherwise clear. No pleural fluid. Degenerative changes in the shoulders. IMPRESSION: Question mild left basilar atelectasis. Otherwise, no acute findings. Electronically Signed   By: Lorin Picket M.D.   On: 06/30/2017 12:54    EKG:  Atrial fibrillation  IMPRESSION AND PLAN:   76 year old male with history of chronic atrial fibrillation, dementia, CVA and seizures who presents from PCP office with hypotension however found to have normal blood pressure  when he presented to the ER.   1.  Hypotension: This seems to have resolved.  He may continue his low-dose blood pressure medication at this time.  2.  Lethargy: Patient appears to be close to baseline  check UA Chest x-ray does not show evidence of pneumonia Check Keppra level 3.  Dementia: Continue donepezil and Namenda  4.  Chronic atrial fibrillation: Continue anticoagulation with Eliquis  5.  Seizure  disorder: Continue Keppra  All the records are reviewed and case discussed with ED provider. Management plans discussed with the patient and wife and they are in agreement  CODE STATUS: dnr  TOTAL TIME TAKING CARE OF THIS PATIENT: 45 minutes.    Haskell Rihn M.D on 06/30/2017 at 3:00 PM  Between 7am to 6pm - Pager - [phone]  After 6pm go to [email or web address] - password [email or web address]  [facility]  Office  [phone]  CC: Primary care physician; Jodi Marble, MD

## 2017-06-30 NOTE — ED Notes (Signed)
Attempted to I/O cath patient.  Catheter passed without difficulty, no urine obtained.  IVF infusing.  Re-try after fluid has infused.

## 2017-06-30 NOTE — ED Provider Notes (Signed)
[facility] Emergency Department Provider Note   ____________________________________________   First MD Initiated Contact with Patient 06/30/17 1224     (approximate)  I have reviewed the triage vital signs and the nursing notes.   HISTORY  Chief Complaint Fatigue history is limited by the fact the patient really isn't saying much at all. He in fact is just sitting in the bed nodding his head and acting sleepy although he will follow most commands if they're not detailed  HPI Kenneth Vazquez is a 76 y.o. male 91 in the office although EMS got a blood pressure 126/75 percent as they got there he also reportedly had an O2 sat in the 80s 88. He was recently hospitalized in [place] for lethargy and anemia and discharged in his wife says now he is worse. The onset of the symptoms were slow  Past Medical History:  Diagnosis Date  . Arthritis   . Carotid artery disease ([place])   . Dementia   . Hypertension   . Pacemaker   . Prostate cancer ([place])   . Seizures ([place])    last seizure was a while ago  . Stroke [facility]) x2, 2015   residual weakness, wife is unsure which side    Patient Active Problem List   Diagnosis Date Noted  . Sepsis ([place]) 05/27/2017  . Hyperglycemia 05/27/2017  . Elevated LFTs 05/27/2017  . Paroxysmal atrial fibrillation ([place]) 05/12/2017  . Pacemaker 05/12/2017  . CVA (cerebral vascular accident) ([place]) 03/31/2017  . Essential hypertension 03/31/2017  . Dementia 03/31/2017  . Severe protein-calorie malnutrition ([place]) 03/31/2017  . Normocytic anemia 03/31/2017  . Hyperlipidemia 03/31/2017  . Thrombocytopenia ([place]) 03/31/2017  . Seizures ([place]) 11/27/2016    Past Surgical History:  Procedure Laterality Date  . CAROTID STENT    . left total knee replacement    . PACEMAKER INSERTION    . PROSTATE CRYOABLATION      Prior to Admission medications   Medication Sig Start Date End Date Taking? Authorizing Provider  amLODipine (NORVASC)  2.5 MG tablet Take 1 tablet (2.5 mg total) by mouth daily. 04/08/17   Hosie Poisson, MD  apixaban (ELIQUIS) 5 MG TABS tablet Take 1 tablet (5 mg total) by mouth 2 (two) times daily. 05/12/17   Nahser, Wonda Cheng, MD  atorvastatin (LIPITOR) 20 MG tablet Take 1 tablet (20 mg total) by mouth daily at 6 PM. Patient taking differently: Take 20 mg by mouth every morning.  04/07/17   Hosie Poisson, MD  docusate sodium (COLACE) 100 MG capsule Take 100 mg by mouth daily as needed for mild constipation or moderate constipation.     [provider]  donepezil (ARICEPT) 10 MG tablet Take 1 tablet (10 mg total) by mouth every morning. 11/30/16   Hosie Poisson, MD  feeding supplement, ENSURE ENLIVE, (ENSURE ENLIVE) LIQD Take 237 mLs by mouth 2 (two) times daily between meals. Patient taking differently: Take 237 mLs by mouth 2 (two) times daily.  04/07/17   Hosie Poisson, MD  levETIRAcetam (KEPPRA) 500 MG tablet Take 500 mg by mouth 2 (two) times daily.    [provider]  memantine (NAMENDA) 5 MG tablet Take 5 mg by mouth 2 (two) times daily. 04/15/17 04/15/18  [provider]  Multiple Vitamin (MULTIVITAMIN WITH MINERALS) TABS tablet Take 1 tablet by mouth daily. 06/01/17   Johnson, Clanford L, MD  MYRBETRIQ 25 MG TB24 tablet Take 1 tablet by mouth daily. 11/13/16   [provider]  polyethylene glycol (MIRALAX / GLYCOLAX) packet Take 17 g by mouth 2 (two) times daily. 05/31/17   Johnson, Clanford L, MD  traZODone (DESYREL) 50 MG tablet Take 50 mg by mouth 2 (two) times daily.     [provider]    Allergies Sulfa antibiotics and Sulfasalazine  Family History  Problem Relation Age of Onset  . CVA Mother     Social History Social History  Substance Use Topics  . Smoking status: Never Smoker  . Smokeless tobacco: Never Used  . Alcohol use No     Comment: h/o heavy use, no use in recent years    Review of Systems  unable to  obtain ____________________________________________   PHYSICAL EXAM:  VITAL SIGNS: ED Triage Vitals [06/30/17 1225]  Enc Vitals Group     BP 130/77     Pulse Rate 98     Resp 16     Temp 97.8 F (36.6 C)     Temp Source Oral     SpO2      Weight 150 lb 4.8 oz (68.2 kg)     Height 5\' 11"  (1.803 m)     Head Circumference      Peak Flow      Pain Score 0     Pain Loc      Pain Edu?      Excl. in [place]?     Constitutional: sleepy but arousable very skinny although not quite cachectic in no acute distress. Eyes: Conjunctivae are normal.right pupil is asymmetric due to old surgery left pupil is round Head: Atraumatic. Nose: No congestion/rhinnorhea. Mouth/Throat: Mucous membranes are moist.  Oropharynx non-erythematous. Neck: No stridor.   Cardiovascular: Normal rate, regular rhythm. Grossly normal heart sounds.  Good peripheral circulation. Respiratory: Normal respiratory effort.  No retractions. Lungs CTAB. Gastrointestinal: Soft and nontender. No distention. No abdominal bruits. No CVA tenderness. Musculoskeletal: No lower extremity tenderness nor edema.  No joint effusions. Neurologic:  Normal speech and language. No gross focal neurologic deficits are appreciated. No gait instability. Skin:  Skin is warm, dry and intact. No rash noted. Psychiatric: Mood and affect are normal. Speech and behavior are normal.  ____________________________________________   LABS (all labs ordered are listed, but only abnormal results are displayed)  Labs Reviewed  COMPREHENSIVE METABOLIC PANEL - Abnormal; Notable for the following:       Result Value   Glucose, Bld 113 (*)    Calcium 8.0 (*)    Albumin 2.3 (*)    ALT 12 (*)    All other components within normal limits  ACETAMINOPHEN LEVEL - Abnormal; Notable for the following:    Acetaminophen (Tylenol), Serum <10 (*)    All other components within normal limits  CBC WITH DIFFERENTIAL/PLATELET - Abnormal; Notable for the following:     WBC 3.6 (*)    RBC 3.20 (*)    Hemoglobin 9.5 (*)    HCT 28.6 (*)    RDW 16.9 (*)    Platelets 129 (*)    Lymphs Abs 0.5 (*)    All other components within normal limits  TROPONIN I  TSH  URINALYSIS, COMPLETE (UACMP) WITH MICROSCOPIC   ____________________________________________  EKG EKG read and interpreted by me shows an irregularly irregular tachycardia at a rate of 109 looks most consistent with atrial fibrillationthere are flipped T waves inferiorly and in V4 5 and 6 all of this is old compared to the previous EKG ____________________________________________  RADIOLOGY chest x-ray may show a small area  of atelectasis per radiology CT of the head shows no acute disease radiology.  ____________________________________________   PROCEDURES  Procedure(s) performed:  Procedures  Critical Care performed:   ____________________________________________   INITIAL IMPRESSION / ASSESSMENT AND PLAN / ED COURSE  As part of my medical decision making, I reviewed the following data within the [place] care everywhere and old hospital records were reviewed. Forced I am unable to access records from [facility] at this time. Lab tests CT scan x-ray here did not reveal an etiology for his increasing altered mental status or his episode of hypotension that he had at the doctor's office. However both of these symptoms are worrisome. It could be he has early sepsis that is not showing up on lab work or cardiac or other etiology of his problems. I will ask hospitalist to observe him      ____________________________________________   FINAL CLINICAL IMPRESSION(S) / ED DIAGNOSES  Final diagnoses:  Altered mental status, unspecified altered mental status type  Hypotension, unspecified hypotension type      NEW MEDICATIONS STARTED DURING THIS VISIT:  New Prescriptions   No medications on file     Note:  This document was prepared using Dragon voice  recognition software and may include unintentional dictation errors.    Nena Polio, MD 06/30/17 [phone]

## 2017-06-30 NOTE — ED Notes (Signed)
Call to 1C to give report 

## 2017-07-01 LAB — URINALYSIS, COMPLETE (UACMP) WITH MICROSCOPIC
Bilirubin Urine: NEGATIVE
Glucose, UA: NEGATIVE mg/dL
Hgb urine dipstick: NEGATIVE
Ketones, ur: NEGATIVE mg/dL
Nitrite: POSITIVE — AB
PH: 5 (ref 5.0–8.0)
Protein, ur: 30 mg/dL — AB
SPECIFIC GRAVITY, URINE: 1.02 (ref 1.005–1.030)

## 2017-07-01 LAB — BASIC METABOLIC PANEL
ANION GAP: 2 — AB (ref 5–15)
BUN: 14 mg/dL (ref 6–20)
CALCIUM: 7.6 mg/dL — AB (ref 8.9–10.3)
CO2: 25 mmol/L (ref 22–32)
Chloride: 112 mmol/L — ABNORMAL HIGH (ref 101–111)
Creatinine, Ser: 0.62 mg/dL (ref 0.61–1.24)
Glucose, Bld: 87 mg/dL (ref 65–99)
Potassium: 3.5 mmol/L (ref 3.5–5.1)
SODIUM: 139 mmol/L (ref 135–145)

## 2017-07-01 LAB — LEVETIRACETAM LEVEL: Levetiracetam Lvl: 14.4 ug/mL (ref 10.0–40.0)

## 2017-07-01 MED ORDER — ENSURE ENLIVE PO LIQD
237.0000 mL | Freq: Three times a day (TID) | ORAL | Status: DC
  Administered 2017-07-01 – 2017-07-02 (×2): 237 mL via ORAL

## 2017-07-01 MED ORDER — DEXTROSE 5 % IV SOLN
1.0000 g | INTRAVENOUS | Status: DC
  Administered 2017-07-01: 17:00:00 1 g via INTRAVENOUS
  Filled 2017-07-01 (×2): qty 10

## 2017-07-01 NOTE — Progress Notes (Signed)
UA is positive for Infection, Started on rocephin. Added Ur cx. Monitor in hospital.

## 2017-07-01 NOTE — Evaluation (Signed)
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 Recently admitted for sepsis and suspected UTI, now here for hypotension.  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  Wife reports he is generally confused and minimally reactive, but is able to follow simple instructions better than he did today.  Pt with severe lean/slump to the R in sitting with little to no ability to self correct or assist with trying to maintain upright/more neutral posturing. Pt was a dependent assist to get to the recliner and though he showed some interest he was extremely limited with ability to do much functionally today.  Wife reports he is more lethargic than his baseline but has been very limited since CVA this past summer.  Will start 3 day trial to see how pt is able to participate/react with PT.     Follow Up Recommendations Home health PT (per ability to participate)    Equipment Recommendations   (discussed [employer] fast streamer with wife)    Recommendations for Other Services       Precautions / Restrictions Precautions Precautions: Fall Restrictions Weight Bearing Restrictions: No      Mobility  Bed Mobility Overal bed mobility: Needs Assistance Bed Mobility: Supine to Sit     Supine to sit: Max assist     General bed mobility comments: Pt leaning heavily to the R and essentially unable to keep himself upright at all  Transfers Overall transfer level: Needs assistance Equipment used: None Transfers: Squat Pivot Transfers     Squat pivot transfers: Total assist     General transfer comment: Pt unable to show any real assistance, needed total assist to get to recliner, informed nursing he will need +2 assist to get  back to bed   Ambulation/Gait             General Gait Details: unable, not appropriate, has not walked in quite a while  Stairs            Wheelchair Mobility    Modified Rankin (Stroke Patients Only)       Balance Overall balance assessment: Needs assistance Sitting-balance support: Single extremity supported Sitting balance-Leahy Scale: Zero Sitting balance - Comments: Pt unable to keep himself upright at all, heavy lean to the R with little/no postural control     Standing balance-Leahy Scale: Zero                               Pertinent Vitals/Pain Pain Assessment:  (unable to rate, does not indicate pain)    Home Living Family/patient expects to be discharged to:: Private residence Living Arrangements: Spouse/significant other Available Help at Discharge: Family;Available 24 hours/day (multiple children live very close and are actively involved) Type of Home: House Home Access: Stairs to enter   [employer] of Steps: 4  Home Layout: One level Home Equipment: Walker - 2 wheels;Cane - single point      Prior Function Level of Independence: Needs assistance         Comments: Pt has been essentially bed bound and  total assist with transfers, etc and      Hand Dominance   Dominant Hand: Left  Extremity/Trunk Assessment   Upper Extremity Assessment Upper Extremity Assessment: RUE deficits/detail;Generalized weakness (minimal following of instruction for L UE testing) RUE Deficits / Details: essentially flaccid R UE    Lower Extremity Assessment Lower Extremity Assessment: Generalized weakness;RLE deficits/detail RLE Deficits / Details: Pt with R knee flexion posturing, able to get ~15 degrees from neutral, generally very limited AROM       Communication   Communication: Expressive difficulties  Cognition Arousal/Alertness: Awake/alert Behavior During Therapy: WFL for tasks assessed/performed Overall Cognitive  Status: Within Functional Limits for tasks assessed                                        General Comments      Exercises     Assessment/Plan    PT Assessment Patient needs continued PT services  PT Problem List Decreased strength;Decreased range of motion;Decreased activity tolerance;Decreased balance;Decreased mobility;Decreased coordination;Decreased cognition;Decreased knowledge of use of DME;Decreased safety awareness;Decreased knowledge of precautions;Cardiopulmonary status limiting activity       PT Treatment Interventions DME instruction;Therapeutic activities;Functional mobility training;Therapeutic exercise;Balance training;Neuromuscular re-education;Cognitive remediation;Patient/family education    PT Goals (Current goals can be found in the Care Plan section)  Acute Rehab PT Goals Patient Stated Goal: get him doing as much as he can PT Goal Formulation: With family Time For Goal Achievement: 07/15/17 Potential to Achieve Goals: Fair    Frequency Other (Comment) (3 day trial)   Barriers to discharge        Co-evaluation               AM-PAC PT "6 Clicks" Daily Activity  Outcome Measure Difficulty turning over in bed (including adjusting bedclothes, sheets and blankets)?: Unable Difficulty moving from lying on back to sitting on the side of the bed? : Unable Difficulty sitting down on and standing up from a chair with arms (e.g., wheelchair, bedside commode, etc,.)?: Unable Help needed moving to and from a bed to chair (including a wheelchair)?: Total Help needed walking in hospital room?: Total Help needed climbing 3-5 steps with a railing? : Total 6 Click Score: 6    End of Session Equipment Utilized During Treatment: Gait belt Activity Tolerance: Patient limited by lethargy Patient left: with chair alarm set;with call bell/phone within reach;with family/visitor present Nurse Communication: Mobility status PT Visit Diagnosis:  Difficulty in walking, not elsewhere classified (R26.2);Muscle weakness (generalized) (M62.81);Hemiplegia and hemiparesis Hemiplegia - Right/Left: Right Hemiplegia - caused by: Cerebral infarction    Time: 7121-9758 PT Time Calculation (min) (ACUTE ONLY): 28 min   Charges:   PT Evaluation $PT Eval Low Complexity: 1 Low     PT G Codes:   PT G-Codes **NOT FOR INPATIENT CLASS** Functional Assessment Tool Used: AM-PAC 6 Clicks Basic Mobility Functional Limitation: Mobility: Walking and moving around Mobility: Walking and Moving Around Current Status (I3254): 100 percent impaired, limited or restricted Mobility: Walking and Moving Around Goal Status (D8264): At least 80 percent but less than 100 percent impaired, limited or restricted    Kreg Shropshire, DPT 07/01/2017, 3:25 PM

## 2017-07-01 NOTE — Care Management Obs Status (Signed)
[place] NOTIFICATION   Patient Details  Name: Kenneth Vazquez MRN: [id number] Date of Birth: [birth year]   Medicare Observation Status Notification Given:  Yes    Shelbie Ammons, RN 07/01/2017, 8:45 AM

## 2017-07-01 NOTE — Care Management Note (Addendum)
Case Management Note  Patient Details  Name: Kenneth Vazquez MRN: [id number] Date of Birth: 1940-09-09  Subjective/Objective:   Admitted to [facility] under observation status with the diagnosis of hypotension. Lives with wife, Eliseo Squires [phone]). Last was in Dr. Esaw Dace office 06/30/17. [facility]. Released from [facility] last Thursday. Home Health was arranged per [place] for skilled nursing, physical therapy and a nursing assistant. [facility] last June 2018 following a stroke. No home oxygen. Wife helps with feeds, baths, and dressing.   Decreased appetite x 1 week. Last fall was June 2018. Would like transportation per [facility].              Action/Plan: Resume orders per [place].  Will need [facility] for transport   Expected Discharge Date:  07/01/17               Expected Discharge Plan:     In-House Referral:     Discharge planning Services     Post Acute Care Choice:   yes Choice offered to:   wife  DME Arranged:    DME Agency:     HH Arranged:   yes [place] Agency:   [facility]  Status of Service:     If discussed at [place] of Stay Meetings, dates discussed:    Additional Comments:  Shelbie Ammons, RN MSN CCM Care Management [phone] 07/01/2017, 8:47 AM

## 2017-07-01 NOTE — Progress Notes (Signed)
Initial Nutrition Assessment  DOCUMENTATION CODES:   Severe malnutrition in context of chronic illness  INTERVENTION:  Will downgrade diet to dysphagia 2 with thin liquids.  Provide Ensure Enlive po TID, each supplement provides 350 kcal and 20 grams of protein.  Provide multivitamin with minerals daily.  Encouraged adequate intake of calories and protein from soft, easy-to-chew foods and oral nutrition supplements. Discussed supplements that are high in protein and calories.  NUTRITION DIAGNOSIS:   Severe Malnutrition related to chronic illness (dementia, hx CVA, stage II pressure ulcers) as evidenced by 18 percent weight loss over 9 months, severe fat depletion, moderate muscle depletion, severe muscle depletion.  GOAL:   Patient will meet greater than or equal to 90% of their needs  MONITOR:   PO intake, Supplement acceptance, Labs, Weight trends, Skin, I & O's  REASON FOR ASSESSMENT:   Malnutrition Screening Tool    ASSESSMENT:   76 year old male with PMHx of HTN, arthritis, seizures, hx CVA x 2, dementia, hx prostate cancer, CAD admitted with hypotension and lethargy, but hypotension has now resolved.   Patient sitting in chair at time of assessment. Patient is confused and unable to provide history. Wife reports he has had a poor appetite for a while now, but if varies by the day. She tries to provide him 3 meals daily. Sometimes he will only eat a few bites and sometimes he will have 25-50% of the meal. She chops up his food finely because she has noticed he was having some difficulty with chewing and swallowing. She provides him Ensure (she is unsure which type it is), but he is now only able to sip small amounts at time. Wife reports patient has been struggling with constipation and she usually gives him enemas.   UBW 175 lbs. Per chart patient was last 179 lbs on 09/24/2016. He has lost 32.2 lbs (18% body weight) over 9 months, which is significant for time frame.    Medications reviewed and include: Keppra, Miralax.  Labs reviewed: Chloride 112, Anion gap 2.  NUTRITION - FOCUSED PHYSICAL EXAM:    Most Recent Value  Orbital Region  Severe depletion  Upper Arm Region  Severe depletion  Thoracic and Lumbar Region  Severe depletion  Buccal Region  Severe depletion  Temple Region  Severe depletion  Clavicle Bone Region  Severe depletion  Clavicle and Acromion Bone Region  Severe depletion  Scapular Bone Region  Severe depletion  Dorsal Hand  Moderate depletion  Patellar Region  Severe depletion  Anterior Thigh Region  Severe depletion  Posterior Calf Region  Moderate depletion  Edema (RD Assessment)  None  Hair  Reviewed  Eyes  Reviewed  Mouth  Reviewed [poor dentition]  Skin  Reviewed  Nails  Reviewed [missing nail on pointer finger of right hand]       Diet Order:  Diet heart healthy/carb modified Room service appropriate? Yes; Fluid consistency: Thin  EDUCATION NEEDS:   Education needs have been addressed  Skin:  Skin Assessment: Skin Integrity Issues: Skin Integrity Issues:: Stage II, DTI DTI: two to right foot (one is 4cm x 5cm; another is 0.5cm x 0.8cm) Stage II: two to left hip (one is 1.5cm x 2cm; another is 1.5cm x 2.5cm)  Last BM:  PTA (06/29/2017 per chart)  Height:   Ht Readings from Last 1 Encounters:  06/30/17 6' (1.829 m)    Weight:   Wt Readings from Last 1 Encounters:  06/30/17 146 lb 13.2 oz (66.6 kg)  Ideal Body Weight:  80.9 kg  BMI:  Body mass index is 19.91 kg/m.  Estimated Nutritional Needs:   Kcal:  1860-2000 (MSJ x 1.3-1.4)  Protein:  87-100 grams (1.3-1.5 grams/kg)  Fluid:  1.7-2 L/day (25-30 mL/kg)  Willey Blade, MS, RD, LDN Office: [phone] Pager: [phone] After Hours/Weekend Pager: [phone]

## 2017-07-02 DIAGNOSIS — L8990 Pressure ulcer of unspecified site, unspecified stage: Secondary | ICD-10-CM | POA: Insufficient documentation

## 2017-07-02 MED ORDER — CEFUROXIME AXETIL 250 MG PO TABS: 250.0000 mg | ORAL_TABLET | Freq: Two times a day (BID) | ORAL | 0 refills | Status: AC

## 2017-07-02 NOTE — Care Management (Signed)
Spoke with Dr. Anselm Jungling. Will be discharging to home today. Will resume services per [place]. Palliative in the home.  Wife request transportation per [employer]. Shelbie Ammons RN MSN CCM Care Management [phone]

## 2017-07-02 NOTE — Progress Notes (Signed)
New referral for out Patient Palliative to follow at home received from [facility]. Plan is for discharge home today. Will also be followed by [facility]. Referral made aware. Thank you. Flo Shanks RN, BSN, [place] and [facility] [place], [facility] [phone] c

## 2017-07-02 NOTE — Progress Notes (Signed)
[facility] [place] NAME: Kenneth Vazquez    MR#:  [id number]  DATE OF BIRTH:  08-Apr-1941  SUBJECTIVE:  CHIEF COMPLAINT:   Chief Complaint  Patient presents with  . Fatigue    Hx of stroke and since last few months- progressive decline functionally, now more lethargic and not eating well. Sent in for hypotension. BP is stable now. Found to have UTI. REVIEW OF SYSTEMS:    Because of lethargy, pt is not able to give ROS.  ROS  DRUG ALLERGIES:   Allergies  Allergen Reactions  . Sulfa Antibiotics Hives  . Sulfasalazine Hives    VITALS:  Blood pressure 124/73, pulse 80, temperature 98.4 F (36.9 C), temperature source Oral, resp. rate 19, height 6' (1.829 m), weight 66.6 kg (146 lb 13.2 oz), SpO2 99 %.  PHYSICAL EXAMINATION:   Constitutional: He is well-developed, malnourished, and in no distress. No distress.  HENT:  Head: Normocephalic.  Eyes: No scleral icterus.  Neck: Normal range of motion. Neck supple. No JVD present. No tracheal deviation present.  Cardiovascular: Normal rate, regular rhythm and normal heart sounds.  Exam reveals no gallop and no friction rub.   No murmur heard. Pulmonary/Chest: Effort normal and breath sounds normal. No respiratory distress. He has no wheezes. He has no rales. He exhibits no tenderness.  Abdominal: Soft. Bowel sounds are normal. He exhibits no distension and no mass. There is no tenderness. There is no rebound and no guarding.  Musculoskeletal: He exhibits no edema or tenderness.  Globally weak  Neurological:  Oriented to name not place or time  He is awake  Somewhat lethargic but answers questions  Skin: Skin is warm. No rash noted. No erythema.  Psychiatric:  Dementia  Physical Exam LABORATORY PANEL:   CBC  Recent Labs Lab 06/30/17 1308  WBC 3.6*  HGB 9.5*  HCT 28.6*  PLT 129*    ------------------------------------------------------------------------------------------------------------------  Chemistries   Recent Labs Lab 06/30/17 1308 07/01/17 0535  NA 138 139  K 3.8 3.5  CL 107 112*  CO2 24 25  GLUCOSE 113* 87  BUN 15 14  CREATININE 0.81 0.62  CALCIUM 8.0* 7.6*  AST 27  --   ALT 12*  --   ALKPHOS 46  --   BILITOT 0.4  --    ------------------------------------------------------------------------------------------------------------------  Cardiac Enzymes  Recent Labs Lab 06/30/17 1308  TROPONINI <0.03   ------------------------------------------------------------------------------------------------------------------  RADIOLOGY:  Ct Head Wo Contrast  Result Date: 06/30/2017 CLINICAL DATA:  Lethargy and hypotension. EXAM: CT HEAD WITHOUT CONTRAST TECHNIQUE: Contiguous axial images were obtained from the base of the skull through the vertex without intravenous contrast. COMPARISON:  May 16, 2017 FINDINGS: Brain: Moderate diffuse atrophy is stable. There is no intracranial mass, hemorrhage, extra-axial fluid collection, or midline shift. There is small vessel disease throughout the centra semiovale bilaterally. Patchy small vessel disease is noted in each basal ganglia region, stable. There is evidence of a prior focal infarct in the left mid to lower pons. There is a small lacunar type infarct in the superior cerebellum on the left. No acute infarct is demonstrated on this study. Vascular: There is no hyperdense vessel. There is calcification in both carotid siphon regions. Skull: The bony calvarium appears intact. Sinuses/Orbits: There is opacification of several ethmoid air cells bilaterally. Other visualized paranasal sinuses are clear. Visualized orbits appear symmetric bilaterally. Other: Visualized mastoid air cells are clear. IMPRESSION: 1. Atrophy with extensive supratentorial small vessel disease. Prior focal  infarct left mid to lower  pons. Small lacunar in large superior left cerebellum. No acute infarct is demonstrated on this study. No intracranial mass, hemorrhage, or extra-axial fluid collection. 2.  There are foci of arterial vascular calcification. 3.  Areas of ethmoid sinus disease noted. Electronically Signed   By: Lowella Grip III M.D.   On: 06/30/2017 12:49   Dg Chest Portable 1 View  Result Date: 06/30/2017 CLINICAL DATA:  Lethargy and hypotension. EXAM: PORTABLE CHEST 1 VIEW COMPARISON:  CT chest 05/30/2017 and chest radiograph 05/28/2017. FINDINGS: Trachea is midline. Heart size stable. Thoracic aorta is calcified. Pacemaker lead tips project over the right atrium and right ventricle. There may be minimal left basilar atelectasis. Lungs are otherwise clear. No pleural fluid. Degenerative changes in the shoulders. IMPRESSION: Question mild left basilar atelectasis. Otherwise, no acute findings. Electronically Signed   By: Lorin Picket M.D.   On: 06/30/2017 12:54    ASSESSMENT AND PLAN:   Active Problems:   Hypotension  76 year old male with history of chronic atrial fibrillation, dementia, CVA and seizures who presents from PCP office with hypotension however found to have normal blood pressure  when he presented to the ER.   1.  Hypotension: This seems to have resolved.  He may continue his low-dose blood pressure medication at this time.  2.  UTI with altered mental status Lethargy: Patient appears to be close to baseline  Chest x-ray does not show evidence of pneumonia Check Keppra level Started on Rocephin, Ur cx. 3.  Dementia: Continue donepezil and Namenda  4.  Chronic atrial fibrillation: Continue anticoagulation with Eliquis  5.  Seizure disorder: Continue Keppra      All the records are reviewed and case discussed with Care Management/Social Workerr. Management plans discussed with the patient, family and they are in agreement.  CODE STATUS: DNR  TOTAL TIME TAKING CARE OF THIS  PATIENT: 35 minutes.     POSSIBLE D/C IN 1-2 DAYS, DEPENDING ON CLINICAL CONDITION.   Vaughan Basta M.D on 07/02/2017   Between 7am to 6pm - Pager - [phone]  After 6pm go to [email or web address] - password [email or web address] [place] [facility]  Office  [phone]  CC: Primary care physician; Jodi Marble, MD  Note: This dictation was prepared with Dragon dictation along with smaller phrase technology. Any transcriptional errors that result from this process are unintentional.

## 2017-07-02 NOTE — Progress Notes (Signed)
Discharge instructions given to pt wife (who cares for patient) since patient has dementia. DNR & Prescription for antibiotic attached to paperwork. Pt wife refused for patient to wait for EMS to arrive. Pt assisted into car by RN & NT by standing and pivoting w/ gait belt. Pt wife stated she has family at home waiting to help patient out of car.

## 2017-07-03 LAB — URINE CULTURE: Special Requests: NORMAL

## 2017-07-09 NOTE — Discharge Summary (Signed)
Kenneth Vazquez at [place] NAME: Kenneth Vazquez    MR#:  [id number]  DATE OF BIRTH:  [birth year]  DATE OF ADMISSION:  06/30/2017 ADMITTING PHYSICIAN: Bettey Costa, MD  DATE OF DISCHARGE: 07/02/2017  3:10 PM  PRIMARY CARE PHYSICIAN: Jodi Marble, MD    ADMISSION DIAGNOSIS:  Hypotension, unspecified hypotension type [I95.9] Altered mental status, unspecified altered mental status type [R41.82]  DISCHARGE DIAGNOSIS:  Active Problems:   Hypotension   Pressure injury of skin   SECONDARY DIAGNOSIS:   Past Medical History:  Diagnosis Date  . Arthritis   . Carotid artery disease ([place])   . Dementia   . Hypertension   . Pacemaker   . Prostate cancer ([place])   . Seizures ([place])    last seizure was a while ago  . Stroke [facility]) x2, 2015   residual weakness, wife is unsure which side    HOSPITAL COURSE:   76 year old male with history of chronic atrial fibrillation, dementia, CVA and seizures who presents from PCP office with hypotension however found to have normal blood pressure when he presented to the ER.  1. Hypotension: This seems to have resolved. He may continue his low-dose blood pressure medication at this time.  2. UTI with altered mental status Lethargy: Patient appears to be close to baseline  Chest x-ray does not show evidence of pneumonia Check Keppra level Started on Rocephin, Ur cx. 3. Dementia: Continue donepezil and Namenda  4. Chronic atrial fibrillation: Continue anticoagulation with Eliquis  5. Seizure disorder: Continue Keppra     DISCHARGE CONDITIONS:   Stable.  CONSULTS OBTAINED:    DRUG ALLERGIES:   Allergies  Allergen Reactions  . Sulfa Antibiotics Hives  . Sulfasalazine Hives    DISCHARGE MEDICATIONS:   Discharge Medication List as of 07/02/2017  2:56 PM    START taking these medications   Details  cefUROXime (CEFTIN) 250 MG tablet Take 1 tablet (250 mg total) by mouth  2 (two) times daily., Starting Wed 07/02/2017, Until Sat 07/05/2017, Print      CONTINUE these medications which have NOT CHANGED   Details  amLODipine (NORVASC) 2.5 MG tablet Take 1 tablet (2.5 mg total) by mouth daily., Starting Tue 04/08/2017, Normal    apixaban (ELIQUIS) 5 MG TABS tablet Take 1 tablet (5 mg total) by mouth 2 (two) times daily., Starting Mon 05/12/2017, Normal    aspirin EC 81 MG tablet Take 81 mg by mouth daily., Historical Med    atorvastatin (LIPITOR) 20 MG tablet Take 1 tablet (20 mg total) by mouth daily at 6 PM., Starting Mon 04/07/2017, No Print    docusate sodium (COLACE) 100 MG capsule Take 100 mg by mouth daily as needed for mild constipation or moderate constipation. , Historical Med    donepezil (ARICEPT) 10 MG tablet Take 1 tablet (10 mg total) by mouth every morning., Starting Sat 11/30/2016, Normal    feeding supplement, ENSURE ENLIVE, (ENSURE ENLIVE) LIQD Take 237 mLs by mouth 2 (two) times daily between meals., Starting Mon 04/07/2017, Normal    levETIRAcetam (KEPPRA) 500 MG tablet Take 500 mg by mouth 2 (two) times daily., Historical Med    lubiprostone (AMITIZA) 24 MCG capsule Take 24 mcg by mouth 2 (two) times daily with a meal., Historical Med    memantine (NAMENDA) 5 MG tablet Take 5 mg by mouth 2 (two) times daily., Starting Tue 04/15/2017, Until Wed 04/15/2018, Historical Med    Multiple Vitamin (MULTIVITAMIN WITH MINERALS)  TABS tablet Take 1 tablet by mouth daily., Starting Sun 06/01/2017, No Print    MYRBETRIQ 25 MG TB24 tablet Take 1 tablet by mouth daily., Starting Wed 11/13/2016, Historical Med    polyethylene glycol (MIRALAX / GLYCOLAX) packet Take 17 g by mouth 2 (two) times daily., Starting Sat 05/31/2017, No Print    traZODone (DESYREL) 50 MG tablet Take 50 mg by mouth 2 (two) times daily. , Historical Med         DISCHARGE INSTRUCTIONS:    Follow with PMD.  If you experience worsening of your admission symptoms, develop shortness of  breath, life threatening emergency, suicidal or homicidal thoughts you must seek medical attention immediately by calling 911 or calling your MD immediately  if symptoms less severe.  You Must read complete instructions/literature along with all the possible adverse reactions/side effects for all the Medicines you take and that have been prescribed to you. Take any new Medicines after you have completely understood and accept all the possible adverse reactions/side effects.   Please note  You were cared for by a hospitalist during your hospital stay. If you have any questions about your discharge medications or the care you received while you were in the hospital after you are discharged, you can call the unit and asked to speak with the hospitalist on call if the hospitalist that took care of you is not available. Once you are discharged, your primary care physician will handle any further medical issues. Please note that NO REFILLS for any discharge medications will be authorized once you are discharged, as it is imperative that you return to your primary care physician (or establish a relationship with a primary care physician if you do not have one) for your aftercare needs so that they can reassess your need for medications and monitor your lab values.    Today   CHIEF COMPLAINT:   Chief Complaint  Patient presents with  . Fatigue    HISTORY OF PRESENT ILLNESS:  Kenneth Vazquez  is a 76 y.o. male with a known history of atrial fibrillation, dementia, seizures and CVA who presents from PCP office due to hypotension.  Patient saw his primary care physician as a routine follow-up and his blood pressure systolic was negative.  He was recently discharged from a hospital in [place] due to anemia and dehydration.  His wife reports over the past few days she has noticed that he has some increasing lethargy.  At baseline patient is bedbound and does have episodes of lethargy.  His dementia is  moderate.  He is followed by Dr. Manuella Ghazi from neurology for his dementia and seizures.  When he arrived to the ER his blood pressure systolic was in the 683M without any fluids.   VITAL SIGNS:  Blood pressure 128/73, pulse 73, temperature 97.9 F (36.6 C), temperature source Oral, resp. rate 18, height 6' (1.829 m), weight 66.6 kg (146 lb 13.2 oz), SpO2 99 %.  I/O:  No intake or output data in the 24 hours ending 07/09/17 1314  PHYSICAL EXAMINATION:   Constitutional: He is well-developed, malnourished, and in no distress. No distress.  HENT:  Head: Normocephalic.  Eyes: No scleral icterus.  Neck: Normal range of motion. Neck supple. No JVDpresent. No tracheal deviationpresent.  Cardiovascular: Normal rate, regular rhythmand normal heart sounds. Exam reveals no gallopand no friction rub.  No murmurheard. Pulmonary/Chest: Effort normaland breath sounds normal. No respiratory distress. He has no wheezes. He has no rales. He exhibits no tenderness.  Abdominal: Soft. Bowel sounds are normal. He exhibits no distensionand no mass. There is no tenderness. There is no reboundand no guarding.  Musculoskeletal: He exhibits no edemaor tenderness.  Globally weak Neurological:  Oriented to name not place or time  He is awake  Somewhat lethargic but answers questions Skin: Skin is warm. No rashnoted. No erythema.     DATA REVIEW:   CBC No results for input(s): WBC, HGB, HCT, PLT in the last 168 hours.  Chemistries  No results for input(s): NA, K, CL, CO2, GLUCOSE, BUN, CREATININE, CALCIUM, MG, AST, ALT, ALKPHOS, BILITOT in the last 168 hours.  Invalid input(s): GFRCGP  Cardiac Enzymes No results for input(s): TROPONINI in the last 168 hours.  Microbiology Results  Results for orders placed or performed during the hospital encounter of 06/30/17  Culture, Urine     Status: Abnormal   Collection Time: 07/01/17  2:34 PM  Result Value Ref Range Status   Specimen Description  URINE, RANDOM  Final   Special Requests Normal  Final   Culture >=100,000 COLONIES/mL ESCHERICHIA COLI (A)  Final   Report Status 07/03/2017 FINAL  Final   Organism ID, Bacteria ESCHERICHIA COLI (A)  Final      Susceptibility   Escherichia coli - MIC*    AMPICILLIN >=32 RESISTANT Resistant     CEFAZOLIN <=4 SENSITIVE Sensitive     CEFTRIAXONE <=1 SENSITIVE Sensitive     CIPROFLOXACIN <=0.25 SENSITIVE Sensitive     GENTAMICIN <=1 SENSITIVE Sensitive     IMIPENEM <=0.25 SENSITIVE Sensitive     NITROFURANTOIN <=16 SENSITIVE Sensitive     TRIMETH/SULFA <=20 SENSITIVE Sensitive     AMPICILLIN/SULBACTAM 16 INTERMEDIATE Intermediate     PIP/TAZO <=4 SENSITIVE Sensitive     Extended ESBL NEGATIVE Sensitive     * >=100,000 COLONIES/mL ESCHERICHIA COLI    RADIOLOGY:  No results found.  EKG:   Orders placed or performed during the hospital encounter of 06/30/17  . ED EKG  . ED EKG  . EKG 12-Lead  . EKG 12-Lead      Management plans discussed with the patient, family and they are in agreement.  CODE STATUS:  Code Status History    Date Active Date Inactive Code Status Order ID Comments User Context   06/30/2017 15:11 07/02/2017 19:08 DNR [id number]  Bettey Costa, MD ED   05/27/2017 21:22 05/31/2017 20:06 DNR [id number]  Karmen Bongo, MD Inpatient   03/31/2017 20:03 04/07/2017 21:53 DNR [id number]  Karmen Bongo, MD Inpatient   11/27/2016 14:59 11/29/2016 17:40 Full Code [id number]  Hosie Poisson, MD ED    Questions for Most Recent Historical Code Status (Order [id number])    Question Answer Comment   In the event of cardiac or respiratory ARREST Do not call a "code blue"    In the event of cardiac or respiratory ARREST Do not perform Intubation, CPR, defibrillation or ACLS    In the event of cardiac or respiratory ARREST Use medication by any route, position, wound care, and other measures to relive pain and suffering. May use oxygen, suction and manual treatment of airway obstruction as  needed for comfort.       TOTAL TIME TAKING CARE OF THIS PATIENT: 35 minutes.    Vaughan Basta M.D on 07/09/2017 at 1:14 PM  Between 7am to 6pm - Pager - [phone]  After 6pm go to [email or web address] - password [email or web address]  [facility]  Office  [phone]  CC: Primary care physician; Jodi Marble, MD  Note: This dictation was prepared with Dragon dictation along with smaller phrase technology. Any transcriptional errors that result from this process are unintentional.

## 2017-08-07 ENCOUNTER — Other Ambulatory Visit: Payer: Self-pay

## 2017-08-07 ENCOUNTER — Encounter: Payer: Self-pay | Admitting: Emergency Medicine

## 2017-08-07 ENCOUNTER — Emergency Department: Payer: Medicare Other

## 2017-08-07 ENCOUNTER — Inpatient Hospital Stay
Admission: EM | Admit: 2017-08-07 | Discharge: 2017-08-08 | DRG: 640 | Disposition: A | Discharge status: Hospice | Payer: Medicare Other | Attending: Internal Medicine | Admitting: Internal Medicine

## 2017-08-07 DIAGNOSIS — K2970 Gastritis, unspecified, without bleeding: Secondary | ICD-10-CM | POA: Diagnosis present

## 2017-08-07 DIAGNOSIS — E785 Hyperlipidemia, unspecified: Secondary | ICD-10-CM | POA: Diagnosis present

## 2017-08-07 DIAGNOSIS — Z950 Presence of cardiac pacemaker: Secondary | ICD-10-CM

## 2017-08-07 DIAGNOSIS — I10 Essential (primary) hypertension: Secondary | ICD-10-CM | POA: Diagnosis present

## 2017-08-07 DIAGNOSIS — I700 Atherosclerosis of aorta: Secondary | ICD-10-CM | POA: Diagnosis present

## 2017-08-07 DIAGNOSIS — L89213 Pressure ulcer of right hip, stage 3: Secondary | ICD-10-CM | POA: Diagnosis present

## 2017-08-07 DIAGNOSIS — F0390 Unspecified dementia without behavioral disturbance: Secondary | ICD-10-CM | POA: Diagnosis present

## 2017-08-07 DIAGNOSIS — E870 Hyperosmolality and hypernatremia: Principal | ICD-10-CM | POA: Diagnosis present

## 2017-08-07 DIAGNOSIS — Z823 Family history of stroke: Secondary | ICD-10-CM

## 2017-08-07 DIAGNOSIS — R64 Cachexia: Secondary | ICD-10-CM | POA: Diagnosis present

## 2017-08-07 DIAGNOSIS — Z8546 Personal history of malignant neoplasm of prostate: Secondary | ICD-10-CM

## 2017-08-07 DIAGNOSIS — G9341 Metabolic encephalopathy: Secondary | ICD-10-CM | POA: Diagnosis present

## 2017-08-07 DIAGNOSIS — Z7982 Long term (current) use of aspirin: Secondary | ICD-10-CM

## 2017-08-07 DIAGNOSIS — F419 Anxiety disorder, unspecified: Secondary | ICD-10-CM | POA: Diagnosis present

## 2017-08-07 DIAGNOSIS — R4182 Altered mental status, unspecified: Secondary | ICD-10-CM | POA: Diagnosis not present

## 2017-08-07 DIAGNOSIS — R627 Adult failure to thrive: Secondary | ICD-10-CM | POA: Diagnosis present

## 2017-08-07 DIAGNOSIS — L89150 Pressure ulcer of sacral region, unstageable: Secondary | ICD-10-CM | POA: Diagnosis present

## 2017-08-07 DIAGNOSIS — Z7189 Other specified counseling: Secondary | ICD-10-CM | POA: Diagnosis not present

## 2017-08-07 DIAGNOSIS — Z96652 Presence of left artificial knee joint: Secondary | ICD-10-CM | POA: Diagnosis present

## 2017-08-07 DIAGNOSIS — Z7901 Long term (current) use of anticoagulants: Secondary | ICD-10-CM | POA: Diagnosis not present

## 2017-08-07 DIAGNOSIS — Z882 Allergy status to sulfonamides status: Secondary | ICD-10-CM | POA: Diagnosis not present

## 2017-08-07 DIAGNOSIS — E860 Dehydration: Secondary | ICD-10-CM | POA: Diagnosis present

## 2017-08-07 DIAGNOSIS — Z515 Encounter for palliative care: Secondary | ICD-10-CM | POA: Diagnosis present

## 2017-08-07 DIAGNOSIS — I480 Paroxysmal atrial fibrillation: Secondary | ICD-10-CM | POA: Diagnosis present

## 2017-08-07 DIAGNOSIS — Z66 Do not resuscitate: Secondary | ICD-10-CM | POA: Diagnosis present

## 2017-08-07 DIAGNOSIS — G40909 Epilepsy, unspecified, not intractable, without status epilepticus: Secondary | ICD-10-CM | POA: Diagnosis present

## 2017-08-07 DIAGNOSIS — Z8673 Personal history of transient ischemic attack (TIA), and cerebral infarction without residual deficits: Secondary | ICD-10-CM

## 2017-08-07 DIAGNOSIS — R1310 Dysphagia, unspecified: Secondary | ICD-10-CM | POA: Diagnosis present

## 2017-08-07 LAB — COMPREHENSIVE METABOLIC PANEL
ALBUMIN: 2.4 g/dL — AB (ref 3.5–5.0)
ALK PHOS: 46 U/L (ref 38–126)
ALT: 15 U/L — AB (ref 17–63)
AST: 30 U/L (ref 15–41)
Anion gap: 10 (ref 5–15)
BUN: 36 mg/dL — AB (ref 6–20)
CALCIUM: 8.5 mg/dL — AB (ref 8.9–10.3)
CHLORIDE: 125 mmol/L — AB (ref 101–111)
CO2: 24 mmol/L (ref 22–32)
CREATININE: 1.07 mg/dL (ref 0.61–1.24)
GFR calc Af Amer: >60 mL/min (ref >60)
GFR calc non Af Amer: >60 mL/min (ref >60)
GLUCOSE: 111 mg/dL — AB (ref 65–99)
Potassium: 4.1 mmol/L (ref 3.5–5.1)
SODIUM: 159 mmol/L — AB (ref 135–145)
Total Bilirubin: 0.9 mg/dL (ref 0.3–1.2)
Total Protein: 7.1 g/dL (ref 6.5–8.1)

## 2017-08-07 LAB — CBC WITH DIFFERENTIAL/PLATELET
BASOS ABS: 0 10*3/uL (ref 0–0.1)
Basophils Relative: 1 %
EOS ABS: 0 10*3/uL (ref 0–0.7)
EOS PCT: 1 %
HCT: 32.9 % — ABNORMAL LOW (ref 40.0–52.0)
HEMOGLOBIN: 10.6 g/dL — AB (ref 13.0–18.0)
LYMPHS ABS: 1 10*3/uL (ref 1.0–3.6)
LYMPHS PCT: 25 %
MCH: 29.5 pg (ref 26.0–34.0)
MCHC: 32.2 g/dL (ref 32.0–36.0)
MCV: 91.6 fL (ref 80.0–100.0)
Monocytes Absolute: 0.3 10*3/uL (ref 0.2–1.0)
Monocytes Relative: 7 %
NEUTROS PCT: 66 %
Neutro Abs: 2.8 10*3/uL (ref 1.4–6.5)
PLATELETS: 110 10*3/uL — AB (ref 150–440)
RBC: 3.6 MIL/uL — AB (ref 4.40–5.90)
RDW: 19.6 % — ABNORMAL HIGH (ref 11.5–14.5)
WBC: 4.2 10*3/uL (ref 3.8–10.6)

## 2017-08-07 LAB — SODIUM: Sodium: 155 mmol/L — ABNORMAL HIGH (ref 135–145)

## 2017-08-07 LAB — LACTIC ACID, PLASMA
Lactic Acid, Venous: 2.9 mmol/L (ref 0.5–1.9)
Lactic Acid, Venous: 2.6 mmol/L (ref 0.5–1.9)

## 2017-08-07 LAB — TSH: TSH: 2.276 u[IU]/mL (ref 0.350–4.500)

## 2017-08-07 LAB — GLUCOSE, CAPILLARY: Glucose-Capillary: 101 mg/dL — ABNORMAL HIGH (ref 65–99)

## 2017-08-07 MED ORDER — TRAZODONE HCL 50 MG PO TABS: 50.0000 mg | ORAL_TABLET | Freq: Every day | ORAL | Status: DC

## 2017-08-07 MED ORDER — ONDANSETRON HCL 4 MG/2ML IJ SOLN: 4.0000 mg | Freq: Four times a day (QID) | INTRAMUSCULAR | Status: DC | PRN

## 2017-08-07 MED ORDER — LEVETIRACETAM 500 MG PO TABS
500.0000 mg | ORAL_TABLET | Freq: Two times a day (BID) | ORAL | Status: DC
  Filled 2017-08-07: qty 1

## 2017-08-07 MED ORDER — SODIUM CHLORIDE 0.9 % IV BOLUS (SEPSIS)
1000.0000 mL | Freq: Once | INTRAVENOUS | Status: AC
Start: 2017-08-07 — End: 2017-08-07
  Administered 2017-08-07: 1000 mL via INTRAVENOUS

## 2017-08-07 MED ORDER — POLYETHYLENE GLYCOL 3350 17 G PO PACK
17.0000 g | PACK | Freq: Two times a day (BID) | ORAL | Status: DC | PRN
Start: 2017-08-07 — End: 2017-08-09

## 2017-08-07 MED ORDER — DEXTROSE 5 % IV SOLN
Freq: Once | INTRAVENOUS | Status: AC
  Administered 2017-08-07: 15:00:00 via INTRAVENOUS

## 2017-08-07 MED ORDER — ACETAMINOPHEN 650 MG RE SUPP: 650.0000 mg | Freq: Four times a day (QID) | RECTAL | Status: DC | PRN

## 2017-08-07 MED ORDER — INFLUENZA VAC SPLIT HIGH-DOSE 0.5 ML IM SUSY
0.5000 mL | PREFILLED_SYRINGE | INTRAMUSCULAR | Status: DC
  Filled 2017-08-07: qty 0.5

## 2017-08-07 MED ORDER — ATORVASTATIN CALCIUM 20 MG PO TABS: 20.0000 mg | ORAL_TABLET | Freq: Every day | ORAL | Status: DC

## 2017-08-07 MED ORDER — APIXABAN 5 MG PO TABS: 5.0000 mg | ORAL_TABLET | Freq: Two times a day (BID) | ORAL | Status: DC

## 2017-08-07 MED ORDER — ONDANSETRON HCL 4 MG PO TABS: 4.0000 mg | ORAL_TABLET | Freq: Four times a day (QID) | ORAL | Status: DC | PRN

## 2017-08-07 MED ORDER — ACETAMINOPHEN 325 MG PO TABS: 650.0000 mg | ORAL_TABLET | Freq: Four times a day (QID) | ORAL | Status: DC | PRN

## 2017-08-07 MED ORDER — ENSURE ENLIVE PO LIQD: 237.0000 mL | Freq: Two times a day (BID) | ORAL | Status: DC

## 2017-08-07 MED ORDER — SODIUM CHLORIDE 0.9 % IV SOLN
500.0000 mg | Freq: Two times a day (BID) | INTRAVENOUS | Status: DC
  Administered 2017-08-07: 500 mg via INTRAVENOUS
  Filled 2017-08-07 (×4): qty 5

## 2017-08-07 MED ORDER — TERAZOSIN HCL 2 MG PO CAPS
2.0000 mg | ORAL_CAPSULE | Freq: Every day | ORAL | Status: DC
  Filled 2017-08-07 (×2): qty 1

## 2017-08-07 MED ORDER — SODIUM CHLORIDE 0.9 % IV BOLUS (SEPSIS)
1000.0000 mL | Freq: Once | INTRAVENOUS | Status: AC
  Administered 2017-08-07: 1000 mL via INTRAVENOUS

## 2017-08-07 MED ORDER — DEXTROSE 5 % IV SOLN
INTRAVENOUS | Status: DC
  Administered 2017-08-07 – 2017-08-08 (×2): via INTRAVENOUS

## 2017-08-07 MED ORDER — ADULT MULTIVITAMIN W/MINERALS CH: 1.0000 | ORAL_TABLET | Freq: Every day | ORAL | Status: DC

## 2017-08-07 MED ORDER — DOCUSATE SODIUM 100 MG PO CAPS: 100.0000 mg | ORAL_CAPSULE | Freq: Two times a day (BID) | ORAL | Status: DC

## 2017-08-07 MED ORDER — DONEPEZIL HCL 5 MG PO TABS
10.0000 mg | ORAL_TABLET | Freq: Every morning | ORAL | Status: DC
  Filled 2017-08-07: qty 2

## 2017-08-07 NOTE — H&P (Addendum)
Kenneth Vazquez is an 76 y.o. male.   Chief Complaint: Altered mental status HPI: The patient with history of hypertension, seizures, dementia and prostate cancer presents to the emergency department from his nursing facility due to decreased responsiveness.  The patient's family states that he has not been eating for at least 4 days.  He does not usually respond verbally but he is able to speak.  Sometimes it appears that he is able to follow commands but at this time he is only arousable to strong verbal stimuli.  He cannot provide his own history.  Laboratory evaluation revealed elevated lactic acid and hypernatremia.  CT of his head showed no acute intracranial process.  Also, the patient has no signs of infection.  After fluid resuscitation in the emergency department staff called the hospitalist service for admission.  Past Medical History:  Diagnosis Date  . Arthritis   . Carotid artery disease ([place])   . Dementia   . Hypertension   . Pacemaker   . Prostate cancer ([place])   . Seizures ([place])    last seizure was a while ago  . Stroke [facility]) x2, 2015   residual weakness, wife is unsure which side    Past Surgical History:  Procedure Laterality Date  . CAROTID STENT    . left total knee replacement    . PACEMAKER INSERTION    . PROSTATE CRYOABLATION      Family History  Problem Relation Age of Onset  . CVA Mother    Social History:  reports that  has never smoked. he has never used smokeless tobacco. He reports that he does not drink alcohol or use drugs.  Allergies:  Allergies  Allergen Reactions  . Sulfa Antibiotics Hives  . Sulfasalazine Hives     (Not in a hospital admission)  Results for orders placed or performed during the hospital encounter of 08/07/17 (from the past 48 hour(s))  Lactic acid, plasma     Status: Abnormal   Collection Time: 08/07/17  1:34 PM  Result Value Ref Range   Lactic Acid, Venous 2.9 (HH) 0.5 - 1.9 mmol/L    Comment: CRITICAL RESULT CALLED  TO, READ BACK BY AND VERIFIED WITH AMBER JONES AT 1432 08/07/17 DAS   CBC with Differential     Status: Abnormal   Collection Time: 08/07/17  1:34 PM  Result Value Ref Range   WBC 4.2 3.8 - 10.6 K/uL   RBC 3.60 (L) 4.40 - 5.90 MIL/uL   Hemoglobin 10.6 (L) 13.0 - 18.0 g/dL   HCT 32.9 (L) 40.0 - 52.0 %   MCV 91.6 80.0 - 100.0 fL   MCH 29.5 26.0 - 34.0 pg   MCHC 32.2 32.0 - 36.0 g/dL   RDW 19.6 (H) 11.5 - 14.5 %   Platelets 110 (L) 150 - 440 K/uL   Neutrophils Relative % 66 %   Neutro Abs 2.8 1.4 - 6.5 K/uL   Lymphocytes Relative 25 %   Lymphs Abs 1.0 1.0 - 3.6 K/uL   Monocytes Relative 7 %   Monocytes Absolute 0.3 0.2 - 1.0 K/uL   Eosinophils Relative 1 %   Eosinophils Absolute 0.0 0 - 0.7 K/uL   Basophils Relative 1 %   Basophils Absolute 0.0 0 - 0.1 K/uL  Comprehensive metabolic panel     Status: Abnormal   Collection Time: 08/07/17  1:34 PM  Result Value Ref Range   Sodium 159 (H) 135 - 145 mmol/L   Potassium 4.1 3.5 - 5.1  mmol/L   Chloride 125 (H) 101 - 111 mmol/L   CO2 24 22 - 32 mmol/L   Glucose, Bld 111 (H) 65 - 99 mg/dL   BUN 36 (H) 6 - 20 mg/dL   Creatinine, Ser 1.07 0.61 - 1.24 mg/dL   Calcium 8.5 (L) 8.9 - 10.3 mg/dL   Total Protein 7.1 6.5 - 8.1 g/dL   Albumin 2.4 (L) 3.5 - 5.0 g/dL   AST 30 15 - 41 U/L   ALT 15 (L) 17 - 63 U/L   Alkaline Phosphatase 46 38 - 126 U/L   Total Bilirubin 0.9 0.3 - 1.2 mg/dL   GFR calc non Af Amer >60 >60 mL/min   GFR calc Af Amer >60 >60 mL/min    Comment: (NOTE) The eGFR has been calculated using the CKD EPI equation. This calculation has not been validated in all clinical situations. eGFR's persistently <60 mL/min signify possible Chronic Kidney Disease.    Anion gap 10 5 - 15   Ct Head Wo Contrast  Result Date: 08/07/2017 CLINICAL DATA:  Encephalopathy. EXAM: CT HEAD WITHOUT CONTRAST TECHNIQUE: Contiguous axial images were obtained from the base of the skull through the vertex without intravenous contrast. COMPARISON:  CT  scan of June 30, 2017. FINDINGS: Brain: Mild diffuse cortical atrophy is noted. Moderate chronic ischemic white matter disease is noted. 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 No mass effect or midline shift is noted. Ventricular size is within normal limits. There is no evidence of mass lesion, hemorrhage or acute infarction. Vascular: No hyperdense vessel or unexpected calcification. Skull: Normal. Negative for fracture or focal lesion. Sinuses/Orbits: No acute finding. Other: None. IMPRESSION: Mild diffuse cortical atrophy. Moderate chronic ischemic white matter disease. 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 No acute intracranial abnormality seen. Electronically Signed   By: Marijo Conception, M.D.   On: 08/07/2017 14:12   Dg Chest Portable 1 View  Result Date: 08/07/2017 CLINICAL DATA:  Lethargic EXAM: PORTABLE CHEST 1 VIEW COMPARISON:  June 30, 2017 FINDINGS: There is no edema or consolidation. Heart is mildly enlarged with pulmonary vascularity within normal limits. Pacemaker leads are attached to the right atrium and right ventricle. There is no adenopathy. There is aortic atherosclerosis. No bone lesions. IMPRESSION: Heart mildly enlarged with pacemaker leads attached to right atrium and right ventricle. No edema or consolidation. There is aortic atherosclerosis. Aortic Atherosclerosis (ICD10-I70.0). Electronically Signed   By: Lowella Grip III M.D.   On: 08/07/2017 14:32    Review of Systems  Unable to perform ROS: Patient unresponsive    Blood pressure (!) 136/99, pulse 92, temperature 99 F (37.2 C), temperature source Rectal, resp. rate (!) 24, height 5' 11"  (1.803 m), weight 54.4 kg (120 lb), SpO2 100 %. Physical Exam  Vitals reviewed. Constitutional: He appears lethargic. He appears cachectic. No distress.  HENT:  Head: Normocephalic and atraumatic.  Mouth/Throat: Oropharynx is clear and moist.  Eyes: Conjunctivae and EOM are normal. Pupils are equal, round, and reactive  to light. No scleral icterus.  Neck: Normal range of motion. Neck supple. No JVD present. No tracheal deviation present. No thyromegaly present.  Cardiovascular: Normal rate, regular rhythm and normal heart sounds. Exam reveals no gallop and no friction rub.  No murmur heard. Respiratory: Effort normal and breath sounds normal. No respiratory distress.  GI: Soft. Bowel sounds are normal. He exhibits no distension. There is no tenderness.  Scaphoid   Genitourinary:  Genitourinary Comments: Deferred  Musculoskeletal: Normal range of motion. He  exhibits no edema.  Lymphadenopathy:    He has no cervical adenopathy.  Neurological: He appears lethargic. No cranial nerve deficit.  Skin: Skin is warm and dry. No rash noted. He is not diaphoretic. No erythema.  Psychiatric:  Cannot assess as the patient is barely responsive     Assessment/Plan This is a 76 year old male admitted for hypernatremia. 1.  Hypernatremia: Secondary to dehydration due to poor p.o. intake.  The patient is cachectic and lethargic.  Volume resuscitate with normal saline.  D5W for maintenance fluid.  Check sodium every 6 hours.  2.  Seizure disorder: No seizure activity.  Continue Keppra 3.  Dementia: He is refusing food.  His family thinks that he may have pain with swallowing.  Consider speech eval and video-assisted swallow.  Restart Aricept.  Trazodone per home regimen 4.  Failure to thrive: The patient has had decreased appetite for longer than this 1 week decreasing responsiveness.  He appears to be in the early stages of the end of life process.  Consult palliative care. 5.  Prostate obstruction: History of prostate cancer status post cryoablation and cystoscopy to remove prostate stone, per records. Continue terazosin 6.  History of stroke: Continue Eliquis 7.  GI prophylaxis: Add pantoprazole for probable gastritis 8.  DVT prophylaxis: therapeutic anticoagulation as above The patient is a DNR.  Time spent on  admission orders and patient care approximately 45 minutes  Harrie Foreman, MD 08/07/2017, 4:00 PM

## 2017-08-07 NOTE — Progress Notes (Signed)
During bedside report and while placing pt on the telemetry monitor, it was noted by this RN and Roswell Miners, RN that pt's mental status had changed since his admission. He is less responsive and does not open eyes to verbal or painful stimulus. VS checked and they are currently stable (140/96 [106], 74, 16, 100% RA). CBG 101. Rapid response and MD called. Dr. Marcille Blanco came to bedside and assessed pt/talked to family. Upon Dr. Tinnie Gens arrival, the pt was beginning to slightly open and close his eyes. He did nod in response to his daughter asking if "he was ok". Per MD, pt is extremely weak from chronic medical issues and acute lack of nutrition over the last few days. This RN asked if abx should be initiated due to pt's elevated LA, but MD believes the elevated LA may be from dehydration, so no abx ordered at this time. MD also notified that pt's family told nursing that pt has not been able to have most medications (including eliquis) over the last few days due to not eating or drinking.  No new orders received at this time.

## 2017-08-07 NOTE — ED Provider Notes (Signed)
[facility] Emergency Department Provider Note  Time seen: 1:40 PM  I have reviewed the triage vital signs and the nursing notes.   HISTORY  Chief Complaint Altered Mental Status    HPI Kenneth Vazquez is a 76 y.o. male with a past medical history of dementia, hypertension, seizure disorder, CVA, presents to the emergency department for altered mental status.  EMS reports the family states the patient is normally alert and oriented and can converse like normal.  States today the patient has been much more somnolent and largely unresponsive to verbal stimuli.  According to record review 07/02/17 the patient was discharged from the hospital those notes state that the patient is bedbound at baseline, but does maintain conversation and orientation with occasional lethargy episodes per note.  Patient cannot contribute to his history today.   Past Medical History:  Diagnosis Date  . Arthritis   . Carotid artery disease ([place])   . Dementia   . Hypertension   . Pacemaker   . Prostate cancer ([place])   . Seizures ([place])    last seizure was a while ago  . Stroke [facility]) x2, 2015   residual weakness, wife is unsure which side    Patient Active Problem List   Diagnosis Date Noted  . Pressure injury of skin 07/02/2017  . Hypotension 06/30/2017  . Sepsis ([place]) 05/27/2017  . Hyperglycemia 05/27/2017  . Elevated LFTs 05/27/2017  . Paroxysmal atrial fibrillation ([place]) 05/12/2017  . Pacemaker 05/12/2017  . CVA (cerebral vascular accident) ([place]) 03/31/2017  . Essential hypertension 03/31/2017  . Dementia 03/31/2017  . Severe protein-calorie malnutrition ([place]) 03/31/2017  . Normocytic anemia 03/31/2017  . Hyperlipidemia 03/31/2017  . Thrombocytopenia ([place]) 03/31/2017  . Seizures ([place]) 11/27/2016    Past Surgical History:  Procedure Laterality Date  . CAROTID STENT    . left total knee replacement    . PACEMAKER INSERTION    . PROSTATE CRYOABLATION      Prior to  Admission medications   Medication Sig Start Date End Date Taking? Authorizing Provider  amLODipine (NORVASC) 2.5 MG tablet Take 1 tablet (2.5 mg total) by mouth daily. 04/08/17   Hosie Poisson, MD  apixaban (ELIQUIS) 5 MG TABS tablet Take 1 tablet (5 mg total) by mouth 2 (two) times daily. 05/12/17   Nahser, Wonda Cheng, MD  aspirin EC 81 MG tablet Take 81 mg by mouth daily.    [provider]  atorvastatin (LIPITOR) 20 MG tablet Take 1 tablet (20 mg total) by mouth daily at 6 PM. Patient taking differently: Take 20 mg by mouth every morning.  04/07/17   Hosie Poisson, MD  docusate sodium (COLACE) 100 MG capsule Take 100 mg by mouth daily as needed for mild constipation or moderate constipation.     [provider]  donepezil (ARICEPT) 10 MG tablet Take 1 tablet (10 mg total) by mouth every morning. 11/30/16   Hosie Poisson, MD  feeding supplement, ENSURE ENLIVE, (ENSURE ENLIVE) LIQD Take 237 mLs by mouth 2 (two) times daily between meals. Patient taking differently: Take 237 mLs by mouth 2 (two) times daily.  04/07/17   Hosie Poisson, MD  levETIRAcetam (KEPPRA) 500 MG tablet Take 500 mg by mouth 2 (two) times daily.    [provider]  lubiprostone (AMITIZA) 24 MCG capsule Take 24 mcg by mouth 2 (two) times daily with a meal.    [provider]  memantine (NAMENDA) 5 MG tablet Take 5 mg by mouth 2 (two)  times daily. 04/15/17 04/15/18  [provider]  Multiple Vitamin (MULTIVITAMIN WITH MINERALS) TABS tablet Take 1 tablet by mouth daily. 06/01/17   Johnson, Clanford L, MD  MYRBETRIQ 25 MG TB24 tablet Take 1 tablet by mouth daily. 11/13/16   [provider]  polyethylene glycol (MIRALAX / GLYCOLAX) packet Take 17 g by mouth 2 (two) times daily. 05/31/17   Johnson, Clanford L, MD  traZODone (DESYREL) 50 MG tablet Take 50 mg by mouth 2 (two) times daily.     [provider]    Allergies  Allergen Reactions  . Sulfa Antibiotics Hives  .  Sulfasalazine Hives    Family History  Problem Relation Age of Onset  . CVA Mother     Social History Social History   Tobacco Use  . Smoking status: Never Smoker  . Smokeless tobacco: Never Used  Substance Use Topics  . Alcohol use: No    Comment: h/o heavy use, no use in recent years  . Drug use: No    Review of Systems Unable to obtain and review of systems due to altered mental status.  ____________________________________________   PHYSICAL EXAM:  VITAL SIGNS: ED Triage Vitals  Enc Vitals Group     BP 08/07/17 1327 (!) 136/100     Pulse Rate 08/07/17 1327 92     Resp 08/07/17 1327 20     Temp 08/07/17 1327 99 F (37.2 C)     Temp Source 08/07/17 1327 Rectal     SpO2 08/07/17 1327 100 %     Weight 08/07/17 1323 120 lb (54.4 kg)     Height 08/07/17 1323 5\' 11"  (1.803 m)     Head Circumference --      Peak Flow --      Pain Score --      Pain Loc --      Pain Edu? --      Excl. in [place]? --     Constitutional: Patient is lying in bed keeps his eyes closed most of the exam, occasionally with physical stimuli will attempt to answer questions but is not able to do so.  Patient largely remained somnolent throughout examination and does not attempt to answer questions for the most part.  Does not follow any commands.  Thin/frail in appearance. Eyes: Normal exam ENT   Head: Normocephalic and atraumatic.   Mouth/Throat: Dry mucous membranes Cardiovascular: Normal rate, regular rhythm. Respiratory: Normal respiratory effort without tachypnea nor retractions. Breath sounds are clear, appear to be equal bilaterally. Gastrointestinal: Soft and nontender. No distention.   Musculoskeletal: Thin, frail extremities.  Grade 2 sacral ulceration. Neurologic: At times will attempt to say a word with significant verbal stimuli but is not able to do so.  Is not cooperative with examination, lack of effort, cannot obtain an adequate neurological exam. Skin:  Skin is warm,  dry.  Grade 2 sacral ulceration present. Psychiatric: Largely unresponsive    RADIOLOGY  CT head shows no acute disease. Chest x-ray shows no acute abnormality  ____________________________________________   INITIAL IMPRESSION / ASSESSMENT AND PLAN / ED COURSE  Pertinent labs & imaging results that were available during my care of the patient were reviewed by me and considered in my medical decision making (see chart for details).  Patient presents to the emergency department for altered mental status.  Per family he is alert and oriented and converses at baseline but is bedbound per note review.  Differential at this time would include CVA, metabolic abnormality,  infectious etiology.  We will check labs obtain imaging of the chest and head, obtain a urinalysis via catheterization and continue to closely monitor the patient.  Imaging is normal patient's labs has resulted showing hypernatremia with a sodium of 159 which could very well explain his symptoms.  Family is now here with the patient who states normally he is alert and somewhat talkative will occasionally answer questions but is not oriented contrary to EMS report.  Family states regardless this is definitely a large change from his baseline mental status.  We will admit for IV hydration to reduce sodium level.  CRITICAL CARE Performed by: Harvest Dark   Total critical care time: 30 minutes  Critical care time was exclusive of separately billable procedures and treating other patients.  Critical care was necessary to treat or prevent imminent or life-threatening deterioration.  Critical care was time spent personally by me on the following activities: development of treatment plan with patient and/or surrogate as well as nursing, discussions with consultants, evaluation of patient's response to treatment, examination of patient, obtaining history from patient or surrogate, ordering and performing treatments and  interventions, ordering and review of laboratory studies, ordering and review of radiographic studies, pulse oximetry and re-evaluation of patient's condition.   ____________________________________________   FINAL CLINICAL IMPRESSION(S) / ED DIAGNOSES  Altered mental status Hypernatremia    Harvest Dark, MD 08/07/17 1559

## 2017-08-07 NOTE — ED Notes (Signed)
Patient transported to CT 

## 2017-08-07 NOTE — Progress Notes (Signed)
Dr. Molli Hazard called to see if he would like to place pt on telemetry as pt's HR is irregular and rate is difficult to obtain from dynamap due to pt's body temperature being low. He stated that telemetry monitor could be ordered.

## 2017-08-07 NOTE — Progress Notes (Addendum)
Rapid Response Event Note  Overview  RR called pt noted to be in bed with bair hugger with family at bedside .      Initial Focused Assessment: Pt noted lying in bed VS-WNL  BS -wnl. Pt was receiving bolus for elevated troponin.  head CT done on admission. O2 Sat's Wnl.  Interventions:cont to monitor patient.  Plan of Care (if not transferred):Cont. Current MD orders / MD was coming to bedside to see pt.  Event Summary:   at  2111    at          [facility]

## 2017-08-07 NOTE — Progress Notes (Signed)
Dr. Judd Gaudier paged in order to have medications changed from PO to IV as pt is unable to take medications by mouth due to altered mental status at this time. I also reiterated that the pt has not been in eliquis for the last few days due to not being able to eat/drink. Dr. Judd Gaudier stated not to start pt on an anticoagulant at this time and that he would like to check a U/A prior to ordering a MRI. Oncoming nurse Roswell Miners, RN) and English as a second language teacher Jonita Albee, NT) are aware of need for urine sample. Per ED nurse an in & out was performed, but no urine was in bladder so no sample was obtained. Pt has been receiving IVF and has condom catheter in place in order to obtain urine sample as soon as possible.

## 2017-08-07 NOTE — ED Triage Notes (Signed)
Pt from home. Per EMS family reported he is normally alert and oriented but has been getting lethargic since yesterday. Pt attempted to answer birthday but sound like saying august and is in October. Responded to loud verbal stimulation. Does have noted hx dementia, CVA, and seizures.

## 2017-08-07 NOTE — Progress Notes (Addendum)
Pt transferred from ED to floor. Pt is lethargic but responds to voice. He appears to be oriented x 1, which is his orientation baseline per his wife. Family is at bedside. Pt appears comfortable at this time. Low bed ordered for safety and skin assessment complete.

## 2017-08-07 NOTE — Progress Notes (Signed)
Pt's temperature is 96.1 rectally and his LA has increased to 2.6. Dr. Marcille Blanco called and he stated that he would place order for a fluid bolus and bear hugger.

## 2017-08-07 NOTE — ED Notes (Signed)
This RN and Mateo Flow attempted to cath patient for urine. No urine drained from cath attempt

## 2017-08-07 NOTE — ED Notes (Signed)
Unable to collect lactic acid blood draw, Judson Roch, RN on the floor made aware. Lab also called and made aware and informed that the patient is being transferred to room 125

## 2017-08-08 DIAGNOSIS — Z7189 Other specified counseling: Secondary | ICD-10-CM

## 2017-08-08 DIAGNOSIS — Z515 Encounter for palliative care: Secondary | ICD-10-CM

## 2017-08-08 DIAGNOSIS — E870 Hyperosmolality and hypernatremia: Principal | ICD-10-CM

## 2017-08-08 DIAGNOSIS — R4182 Altered mental status, unspecified: Secondary | ICD-10-CM

## 2017-08-08 LAB — URINALYSIS, COMPLETE (UACMP) WITH MICROSCOPIC
Bilirubin Urine: NEGATIVE
Glucose, UA: NEGATIVE mg/dL
Ketones, ur: NEGATIVE mg/dL
Nitrite: POSITIVE — AB
Protein, ur: 30 mg/dL — AB
Specific Gravity, Urine: 1.024 (ref 1.005–1.030)
pH: 5 (ref 5.0–8.0)

## 2017-08-08 LAB — SODIUM: Sodium: 153 mmol/L — ABNORMAL HIGH (ref 135–145)

## 2017-08-08 MED ORDER — LORAZEPAM 2 MG/ML PO CONC: 1.0000 mg | ORAL | 0 refills | Status: AC | PRN

## 2017-08-08 MED ORDER — ACETAMINOPHEN 650 MG RE SUPP: 650.0000 mg | Freq: Four times a day (QID) | RECTAL | Status: DC | PRN

## 2017-08-08 MED ORDER — GLYCOPYRROLATE 1 MG PO TABS
1.0000 mg | ORAL_TABLET | ORAL | Status: DC | PRN
  Filled 2017-08-08: qty 1

## 2017-08-08 MED ORDER — MORPHINE SULFATE (CONCENTRATE) 10 MG/0.5ML PO SOLN: 5.0000 mg | ORAL | 0 refills | Status: AC | PRN

## 2017-08-08 MED ORDER — HALOPERIDOL 0.5 MG PO TABS: 0.5000 mg | ORAL_TABLET | ORAL | Status: DC | PRN

## 2017-08-08 MED ORDER — MORPHINE SULFATE (CONCENTRATE) 10 MG/0.5ML PO SOLN: 5.0000 mg | ORAL | Status: DC | PRN

## 2017-08-08 MED ORDER — HALOPERIDOL LACTATE 2 MG/ML PO CONC: 0.5000 mg | ORAL | 0 refills | Status: AC | PRN

## 2017-08-08 MED ORDER — LORAZEPAM 2 MG/ML PO CONC: 1.0000 mg | ORAL | Status: DC | PRN

## 2017-08-08 MED ORDER — DEXTROSE 5 % IV SOLN
1.0000 g | INTRAVENOUS | Status: DC
  Filled 2017-08-08: qty 10

## 2017-08-08 MED ORDER — LORAZEPAM 2 MG/ML IJ SOLN: 1.0000 mg | INTRAMUSCULAR | Status: DC | PRN

## 2017-08-08 MED ORDER — ONDANSETRON HCL 4 MG/2ML IJ SOLN: 4.0000 mg | Freq: Four times a day (QID) | INTRAMUSCULAR | Status: DC | PRN

## 2017-08-08 MED ORDER — ONDANSETRON 4 MG PO TBDP
4.0000 mg | ORAL_TABLET | Freq: Four times a day (QID) | ORAL | Status: DC | PRN
  Filled 2017-08-08: qty 1

## 2017-08-08 MED ORDER — GLYCOPYRROLATE 0.2 MG/ML IJ SOLN
0.2000 mg | INTRAMUSCULAR | Status: DC | PRN
  Filled 2017-08-08: qty 1

## 2017-08-08 MED ORDER — HALOPERIDOL LACTATE 2 MG/ML PO CONC
0.5000 mg | ORAL | Status: DC | PRN
  Filled 2017-08-08: qty 0.3

## 2017-08-08 MED ORDER — GLYCOPYRROLATE 1 MG PO TABS: 1.0000 mg | ORAL_TABLET | ORAL | 0 refills | Status: AC | PRN

## 2017-08-08 MED ORDER — ACETAMINOPHEN 325 MG PO TABS: 650.0000 mg | ORAL_TABLET | Freq: Four times a day (QID) | ORAL | Status: AC | PRN

## 2017-08-08 MED ORDER — POLYETHYLENE GLYCOL 3350 17 G PO PACK: 17.0000 g | PACK | Freq: Every day | ORAL | 0 refills | Status: AC | PRN

## 2017-08-08 MED ORDER — BIOTENE DRY MOUTH MT LIQD: 15.0000 mL | OROMUCOSAL | Status: DC | PRN

## 2017-08-08 MED ORDER — GLYCOPYRROLATE 1 MG PO TABS: 1.0000 mg | ORAL_TABLET | ORAL | Status: DC | PRN

## 2017-08-08 MED ORDER — ACETAMINOPHEN 325 MG PO TABS: 650.0000 mg | ORAL_TABLET | Freq: Four times a day (QID) | ORAL | Status: DC | PRN

## 2017-08-08 MED ORDER — LORAZEPAM 1 MG PO TABS: 1.0000 mg | ORAL_TABLET | ORAL | Status: DC | PRN

## 2017-08-08 MED ORDER — POLYVINYL ALCOHOL 1.4 % OP SOLN
1.0000 [drp] | Freq: Four times a day (QID) | OPHTHALMIC | Status: DC | PRN
  Filled 2017-08-08: qty 15

## 2017-08-08 MED ORDER — ONDANSETRON 4 MG PO TBDP: 4.0000 mg | ORAL_TABLET | Freq: Four times a day (QID) | ORAL | 0 refills | Status: AC | PRN

## 2017-08-08 MED ORDER — HALOPERIDOL LACTATE 5 MG/ML IJ SOLN: 0.5000 mg | INTRAMUSCULAR | Status: DC | PRN

## 2017-08-08 NOTE — Progress Notes (Signed)
Pt left via EMS at 2315. Family was at bedside when pt was being transferred. No signs of distress noted at transfer.

## 2017-08-08 NOTE — Progress Notes (Signed)
When cleaning pt, a yellow raised area was observed on scrotum. Oncoming nurse is aware. Pt temperature has been WNL most of shift. Pt became more alert few hours after the rapid was called and has been alert the rest of the shift. No other signs of distress noted. Information pass on to oncoming nurse.

## 2017-08-08 NOTE — Progress Notes (Signed)
Nutrition Brief Note  Patient screen positive for malnutrition screening tool.  Chart reviewed. Pt now transitioning to comfort care.  No further nutrition interventions warranted at this time.  Please re-consult as needed.   Satira Anis. Vitalia Stough, MS, RD LDN Inpatient Clinical Dietitian Pager [phone]

## 2017-08-08 NOTE — Progress Notes (Signed)
Clinical Education officer, museum (CSW) received a consult for home with hospice. RN case manager arranges home with hospice. RN case manager aware of above. Please reconsult if future social work needs arise. CSW signing off.   [employer], LCSW [phone]

## 2017-08-08 NOTE — Progress Notes (Signed)
Per care manager, patient cleared for discharge. Patient prepared for transport; IV dc'ed, pressure applied to stop bleeding- dressing placed. EMS called to transport patient home w/ hospice care.

## 2017-08-08 NOTE — Progress Notes (Signed)
New referral for hospice at home on 76 yo gentleman who is post palliative medicine consult to discuss goals of care.  Patient was admitted to [facility] on 12.6.18 with diagnosis altered mental status r/t hypernatremia.  Patient has history of CAD, pacemaker, prostate cancer, HTN, dementia, prostate cancer, CVA and seizure disorder.  I met with the patient's wife, Johnatha Zeidman, daughter Jeneen Rinks and son Charlann Boxer to discuss hospice at home services.  All are in agreement with and request patient to be discharge home today with hospice services in place.  Patient is being switched to full comfort measures and MOST form completed.  DME needs:  Hospital bed, full size wheelchair, and over bed table.  Patient currently has hospital bed, and full size wheelchair with advanced home care.  Family is in agreement with changing equipment out with [facility].  [facility] rep- Brenton Grills,  notified by this RN that patient will be discharging home with hospice and equipment will need to be picked up.  He will come see the patient.  All information gathered for referral and faxed to referral intake.  Thank you for allowing participation in this patient's care.  Dimas Aguas, RN Clinical Nurse Liaison [facility]

## 2017-08-08 NOTE — Discharge Summary (Signed)
[facility] NAME: Kenneth Vazquez    MR#:  [id number]  DATE OF BIRTH:  Mar 05, 1941  DATE OF ADMISSION:  08/07/2017 ADMITTING PHYSICIAN: Harrie Foreman, MD  DATE OF DISCHARGE: 08/08/17  PRIMARY CARE PHYSICIAN: Jodi Marble, MD    ADMISSION DIAGNOSIS:  Hypernatremia [E87.0] Altered mental status, unspecified altered mental status type [R41.82]  DISCHARGE DIAGNOSIS:  Active Problems:   Hypernatremia FTT Hospice at home SECONDARY DIAGNOSIS:   Past Medical History:  Diagnosis Date  . Arthritis   . Carotid artery disease ([place])   . Dementia   . Hypertension   . Pacemaker   . Prostate cancer ([place])   . Seizures ([place])    last seizure was a while ago  . Stroke [facility]) x2, 2015   residual weakness, wife is unsure which side    HOSPITAL COURSE:   HPI: The patient with history of hypertension, seizures, dementia and prostate cancer presents to the emergency department from his nursing facility due to decreased responsiveness.  The patient's family states that he has not been eating for at least 4 days.  He does not usually respond verbally but he is able to speak.  Sometimes it appears that he is able to follow commands but at this time he is only arousable to strong verbal stimuli.  He cannot provide his own history.  Laboratory evaluation revealed elevated lactic acid and hypernatremia.  CT of his head showed no acute intracranial process.  Also, the patient has no signs of infection.  After fluid resuscitation in the emergency department staff called the hospitalist service for admission.  #Abnormal urinalysis with elevated lactic acid-possible UTI Started patient on IV Rocephin but the family is considering hospice care and palliative care followed with the patient's family #  Hypernatremia: Secondary to dehydration due to poor p.o. intake.  The patient is cachectic and lethargic.   2.  Seizure disorder: No seizure  activity.  Continue Keppra 3.  Dementia: He is refusing food.  4.  Failure to thrive: The patient has had decreased appetite for longer than this 1 week decreasing responsiveness.  He appears to be in the early stages of the end of life process.   5.  Prostate obstruction: History of prostate cancer status post cryoablation and cystoscopy to remove prostate stone, per records. Continue terazosin 6.  History of stroke: d/c  Eliquis 7.  GI prophylaxis: pantoprazole for probable gastritis   Patient is failure to thrive with decreased by mouth intake, discussed with palliative care Wife has requested hospice care at home. Wife is the healthcare power of attorney   DISCHARGE CONDITIONS:   guarded  CONSULTS OBTAINED:     PROCEDURES  None   DRUG ALLERGIES:   Allergies  Allergen Reactions  . Sulfa Antibiotics Hives  . Sulfasalazine Hives    DISCHARGE MEDICATIONS:   Allergies as of 08/08/2017      Reactions   Sulfa Antibiotics Hives   Sulfasalazine Hives      Medication List    STOP taking these medications   amLODipine 2.5 MG tablet Commonly known as:  NORVASC   apixaban 5 MG Tabs tablet Commonly known as:  ELIQUIS   atorvastatin 20 MG tablet Commonly known as:  LIPITOR   donepezil 10 MG tablet Commonly known as:  ARICEPT   multivitamin with minerals Tabs tablet   traZODone 50 MG tablet Commonly known as:  DESYREL     TAKE these medications  acetaminophen 325 MG tablet Commonly known as:  TYLENOL Take 2 tablets (650 mg total) by mouth every 6 (six) hours as needed for mild pain (or Fever >/= 101).   feeding supplement (ENSURE ENLIVE) Liqd Take 237 mLs by mouth 2 (two) times daily between meals. What changed:  when to take this   glycopyrrolate 1 MG tablet Commonly known as:  ROBINUL Take 1 tablet (1 mg total) by mouth every 4 (four) hours as needed (excessive secretions).   haloperidol 2 MG/ML solution Commonly known as:  HALDOL Place 0.3 mLs (0.6 mg  total) under the tongue every 4 (four) hours as needed for agitation (or delirium).   levETIRAcetam 500 MG tablet Commonly known as:  KEPPRA Take 500 mg by mouth 2 (two) times daily.   LORazepam 2 MG/ML concentrated solution Commonly known as:  ATIVAN Place 0.5 mLs (1 mg total) under the tongue every 4 (four) hours as needed for anxiety.   morphine CONCENTRATE 10 MG/0.5ML Soln concentrated solution Take 0.25 mLs (5 mg total) by mouth every 2 (two) hours as needed for moderate pain, severe pain, anxiety or shortness of breath (or dyspnea).   ondansetron 4 MG disintegrating tablet Commonly known as:  ZOFRAN-ODT Take 1 tablet (4 mg total) by mouth every 6 (six) hours as needed for nausea.   polyethylene glycol packet Commonly known as:  MIRALAX / GLYCOLAX Take 17 g by mouth daily as needed. What changed:    when to take this  reasons to take this   terazosin 2 MG capsule Commonly known as:  HYTRIN Take 2 mg by mouth at bedtime.        DISCHARGE INSTRUCTIONS:   Hospice care at home  DIET:  Regular diet  DISCHARGE CONDITION:  guarded ACTIVITY:  Activity as tolerated  OXYGEN:  Home Oxygen: No.   Oxygen Delivery: room air  DISCHARGE LOCATION:  home   If you experience worsening of your admission symptoms, develop shortness of breath, life threatening emergency, suicidal or homicidal thoughts you must seek medical attention immediately by calling 911 or calling your MD immediately  if symptoms less severe.  You Must read complete instructions/literature along with all the possible adverse reactions/side effects for all the Medicines you take and that have been prescribed to you. Take any new Medicines after you have completely understood and accpet all the possible adverse reactions/side effects.   Please note  You were cared for by a hospitalist during your hospital stay. If you have any questions about your discharge medications or the care you received while you  were in the hospital after you are discharged, you can call the unit and asked to speak with the hospitalist on call if the hospitalist that took care of you is not available. Once you are discharged, your primary care physician will handle any further medical issues. Please note that NO REFILLS for any discharge medications will be authorized once you are discharged, as it is imperative that you return to your primary care physician (or establish a relationship with a primary care physician if you do not have one) for your aftercare needs so that they can reassess your need for medications and monitor your lab values.     Today  Chief Complaint  Patient presents with  . Altered Mental Status   Patient is delirious , opens eyes to verbal commands but not responding much Wife at bedside and reporting that he is clinically declining and refusing to eat. Giving up Seen by palliative  care, wife is requesting hospice care at home  ROS:  unobtainable   VITAL SIGNS:  Blood pressure 122/74, pulse (!) 55, temperature 97.6 F (36.4 C), temperature source Oral, resp. rate 14, height 6' (1.829 m), weight 57.7 kg (127 lb 3.2 oz), SpO2 100 %.  I/O:    Intake/Output Summary (Last 24 hours) at 08/08/2017 1505 Last data filed at 08/08/2017 0900 Gross per 24 hour  Intake 2200 ml  Output 30 ml  Net 2170 ml    PHYSICAL EXAMINATION:  GENERAL:  76 y.o.-year-old patient lying in the bed with no acute distress.  EYES: Pupils equal, round, reactive to light and accommodation. No scleral icterus. Extraocular muscles intact.  HEENT: Head atraumatic, normocephalic. Oropharynx and nasopharynx clear.  NECK:  Supple, no jugular venous distention. No thyroid enlargement, no tenderness.  LUNGS: diminished breath sounds bilaterally, no wheezing, rales,rhonchi or crepitation. No use of accessory muscles of respiration.  CARDIOVASCULAR: S1, S2 normal. No murmurs, rubs, or gallops.  ABDOMEN: Soft, non-tender,  non-distended. Bowel sounds present. No organomegaly or mass.  EXTREMITIES: No pedal edema, cyanosis, or clubbing.  NEUROLOGIC: delerious PSYCHIATRIC: The patient is delerious SKIN: No obvious rash, lesion, or ulcer.   DATA REVIEW:   CBC Recent Labs  Lab 08/07/17 1334  WBC 4.2  HGB 10.6*  HCT 32.9*  PLT 110*    Chemistries  Recent Labs  Lab 08/07/17 1334  08/08/17 0415  NA 159*   < > 153*  K 4.1  --   --   CL 125*  --   --   CO2 24  --   --   GLUCOSE 111*  --   --   BUN 36*  --   --   CREATININE 1.07  --   --   CALCIUM 8.5*  --   --   AST 30  --   --   ALT 15*  --   --   ALKPHOS 46  --   --   BILITOT 0.9  --   --    < > = values in this interval not displayed.    Cardiac Enzymes No results for input(s): TROPONINI in the last 168 hours.  Microbiology Results  Results for orders placed or performed during the hospital encounter of 06/30/17  Culture, Urine     Status: Abnormal   Collection Time: 07/01/17  2:34 PM  Result Value Ref Range Status   Specimen Description URINE, RANDOM  Final   Special Requests Normal  Final   Culture >=100,000 COLONIES/mL ESCHERICHIA COLI (A)  Final   Report Status 07/03/2017 FINAL  Final   Organism ID, Bacteria ESCHERICHIA COLI (A)  Final      Susceptibility   Escherichia coli - MIC*    AMPICILLIN >=32 RESISTANT Resistant     CEFAZOLIN <=4 SENSITIVE Sensitive     CEFTRIAXONE <=1 SENSITIVE Sensitive     CIPROFLOXACIN <=0.25 SENSITIVE Sensitive     GENTAMICIN <=1 SENSITIVE Sensitive     IMIPENEM <=0.25 SENSITIVE Sensitive     NITROFURANTOIN <=16 SENSITIVE Sensitive     TRIMETH/SULFA <=20 SENSITIVE Sensitive     AMPICILLIN/SULBACTAM 16 INTERMEDIATE Intermediate     PIP/TAZO <=4 SENSITIVE Sensitive     Extended ESBL NEGATIVE Sensitive     * >=100,000 COLONIES/mL ESCHERICHIA COLI    RADIOLOGY:  Ct Head Wo Contrast  Result Date: 08/07/2017 CLINICAL DATA:  Encephalopathy. EXAM: CT HEAD WITHOUT CONTRAST TECHNIQUE: Contiguous  axial images were obtained from the base of the  skull through the vertex without intravenous contrast. COMPARISON:  CT scan of June 30, 2017. FINDINGS: Brain: Mild diffuse cortical atrophy is noted. Moderate chronic ischemic white matter disease is noted. 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 No mass effect or midline shift is noted. Ventricular size is within normal limits. There is no evidence of mass lesion, hemorrhage or acute infarction. Vascular: No hyperdense vessel or unexpected calcification. Skull: Normal. Negative for fracture or focal lesion. Sinuses/Orbits: No acute finding. Other: None. IMPRESSION: Mild diffuse cortical atrophy. Moderate chronic ischemic white matter disease. 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 No acute intracranial abnormality seen. Electronically Signed   By: Marijo Conception, M.D.   On: 08/07/2017 14:12   Dg Chest Portable 1 View  Result Date: 08/07/2017 CLINICAL DATA:  Lethargic EXAM: PORTABLE CHEST 1 VIEW COMPARISON:  June 30, 2017 FINDINGS: There is no edema or consolidation. Heart is mildly enlarged with pulmonary vascularity within normal limits. Pacemaker leads are attached to the right atrium and right ventricle. There is no adenopathy. There is aortic atherosclerosis. No bone lesions. IMPRESSION: Heart mildly enlarged with pacemaker leads attached to right atrium and right ventricle. No edema or consolidation. There is aortic atherosclerosis. Aortic Atherosclerosis (ICD10-I70.0). Electronically Signed   By: Lowella Grip III M.D.   On: 08/07/2017 14:32    EKG:   Orders placed or performed during the hospital encounter of 08/07/17  . ED EKG  . ED EKG  . EKG 12-Lead  . EKG 12-Lead  . EKG 12-Lead  . EKG 12-Lead      Management plans discussed with the patient's wife and she is  in agreement.  CODE STATUS:     Code Status Orders  (From admission, onward)        Start     Ordered   08/08/17 1226  Do not attempt resuscitation (DNR)   Continuous    Question Answer Comment  In the event of cardiac or respiratory ARREST Do not call a "code blue"   In the event of cardiac or respiratory ARREST Do not perform Intubation, CPR, defibrillation or ACLS   In the event of cardiac or respiratory ARREST Use medication by any route, position, wound care, and other measures to relive pain and suffering. May use oxygen, suction and manual treatment of airway obstruction as needed for comfort.      08/08/17 1227    Code Status History    Date Active Date Inactive Code Status Order ID Comments User Context   08/07/2017 17:39 08/08/2017 12:27 DNR [id number]  Harrie Foreman, MD Inpatient   06/30/2017 15:11 07/02/2017 19:08 DNR [id number]  Bettey Costa, MD ED   05/27/2017 21:22 05/31/2017 20:06 DNR [id number]  Karmen Bongo, MD Inpatient   03/31/2017 20:03 04/07/2017 21:53 DNR [id number]  Karmen Bongo, MD Inpatient   11/27/2016 14:59 11/29/2016 17:40 Full Code [id number]  Hosie Poisson, MD ED    Advance Directive Documentation     Most Recent Value  Type of Advance Directive  Out of facility DNR (pink MOST or yellow form)  Pre-existing out of facility DNR order (yellow form or pink MOST form)  -- [inpatient order already placed]  "MOST" Form in Place?  No data      TOTAL TIME TAKING CARE OF THIS PATIENT: 45 minutes.   Note: This dictation was prepared with Dragon dictation along with smaller phrase technology. Any transcriptional errors that result from this process are unintentional.   @[facility] @  on 08/08/2017 at 3:05 PM  Between  7am to 6pm - Pager - [phone]  After 6pm go to [email or web address] - password [email or web address] [facility]  Office  [phone]  CC: Primary care physician; Jodi Marble, MD

## 2017-08-08 NOTE — Consult Note (Signed)
Consultation Note Date: 08/08/2017   Patient Name: Kenneth Vazquez  DOB: [birth year]  MRN: [id number]  Age / Sex: 76 y.o., male  PCP: Jodi Marble, MD Referring Physician: Nicholes Mango, MD  Reason for Consultation: Establishing goals of care  HPI/Patient Profile:  The patient with history of hypertension, seizures, dementia and prostate cancer presents to the emergency department from his nursing facility due to decreased responsiveness.    Clinical Assessment and Goals of Care: Kenneth Vazquez is resting in bed. He is not alert, and is resting with his eyes closed. Even and unlabored respirations. His wife and children are at bedside. At home, his wife has been providing care for him as he has dementia. She states on a good day he can give yes or no answers, on a bad day he does not speak. He stays in bed during the day. He has been sleeping more over the last few days. His wife states he has not been eating or drinking well for a while, albumin is 2.4. He has had difficulty swallowing over the last few days with coughing and choking upon trying to eat and drink, therefore, she states has not been eating or drinking during the last few days. Wife does not want feeding tube placed.   We discussed his diagnosis, prognosis, GOC, EOL wishes disposition and options.  A detailed discussion was had today regarding advanced directives.  Concepts specific artifical feeding and hydration, continued IV antibiotics and rehospitalization was discussed.  The difference between a aggressive medical intervention path  and a hospice comfort care path for this patient was discussed.  Values and goals of care important to patient and family were discussed. Natural trajectory and expectations at EOL were discussed. Questions and concerns addressed.      With discussion of his current status, frequent infections, lack of oral  intake, difficulty swallowing,  family would like to shift to a comfort based approach. They do not want feeding tube placement, IVF, or IV antibiotics. They would like for him to be at home and tucked in with hospice for symptom management.    MOST form completed: comfort measures  Wife is Media planner.     [place] with hospice.   Code Status/Advance Care Planning:  DNR    Symptom Management:   Morphine for pain, dyspnea  Ativan for anxiety  Haldol for agitation  Robinul for excessive secretions.   Palliative Prophylaxis:   Oral Care  Additional Recommendations (Limitations, Scope, Preferences):  No Artificial Feeding, No Diagnostics, No IV Antibiotics, No IV Fluids and No Lab Draws   Prognosis:   < 2 weeks minimal oral intake with difficulty swallowing. Dementia. Sleeping more, stays in bed.   Discharge Planning: Home with Hospice      Primary Diagnoses: Present on Admission: . Hypernatremia   I have reviewed the medical record, interviewed the patient and family, and examined the patient. The following aspects are pertinent.  Past Medical History:  Diagnosis Date  . Arthritis   .  Carotid artery disease ([place])   . Dementia   . Hypertension   . Pacemaker   . Prostate cancer ([place])   . Seizures ([place])    last seizure was a while ago  . Stroke [facility]) x2, 2015   residual weakness, wife is unsure which side   Social History   Socioeconomic History  . Marital status: Married    Spouse name: None  . Number of children: None  . Years of education: None  . Highest education level: None  Social Needs  . Financial resource strain: None  . Food insecurity - worry: None  . Food insecurity - inability: None  . Transportation needs - medical: None  . Transportation needs - non-medical: None  Occupational History  . None  Tobacco Use  . Smoking status: Never Smoker  . Smokeless tobacco: Never Used  Substance and Sexual  Activity  . Alcohol use: No    Comment: h/o heavy use, no use in recent years  . Drug use: No  . Sexual activity: None  Other Topics Concern  . None  Social History Narrative  . None   Family History  Problem Relation Age of Onset  . CVA Mother    Scheduled Meds: . apixaban  5 mg Oral BID  . atorvastatin  20 mg Oral q1800  . docusate sodium  100 mg Oral BID  . donepezil  10 mg Oral q morning - 10a  . feeding supplement (ENSURE ENLIVE)  237 mL Oral BID BM  . Influenza vac split quadrivalent PF  0.5 mL Intramuscular Tomorrow-1000  . multivitamin with minerals  1 tablet Oral Daily  . terazosin  2 mg Oral QHS  . traZODone  50-100 mg Oral QHS   Continuous Infusions: . cefTRIAXone (ROCEPHIN)  IV    . dextrose 100 mL/hr at 08/08/17 0158  . levETIRAcetam Stopped (08/07/17 2351)   PRN Meds:.acetaminophen **OR** acetaminophen, ondansetron **OR** ondansetron (ZOFRAN) IV, polyethylene glycol Medications Prior to Admission:  Prior to Admission medications   Medication Sig Start Date End Date Taking? Authorizing Provider  apixaban (ELIQUIS) 5 MG TABS tablet Take 1 tablet (5 mg total) by mouth 2 (two) times daily. 05/12/17  Yes Nahser, Wonda Cheng, MD  atorvastatin (LIPITOR) 20 MG tablet Take 1 tablet (20 mg total) by mouth daily at 6 PM. Patient taking differently: Take 20 mg by mouth daily.  04/07/17  Yes Hosie Poisson, MD  levETIRAcetam (KEPPRA) 500 MG tablet Take 500 mg by mouth 2 (two) times daily.   Yes [provider]  Multiple Vitamin (MULTIVITAMIN WITH MINERALS) TABS tablet Take 1 tablet by mouth daily. 06/01/17  Yes Johnson, Clanford L, MD  terazosin (HYTRIN) 2 MG capsule Take 2 mg by mouth at bedtime.   Yes [provider]  traZODone (DESYREL) 50 MG tablet Take 50-100 mg by mouth at bedtime.    Yes [provider]  amLODipine (NORVASC) 2.5 MG tablet Take 1 tablet (2.5 mg total) by mouth daily. Patient not taking: Reported on 08/07/2017 04/08/17   Hosie Poisson,  MD  donepezil (ARICEPT) 10 MG tablet Take 1 tablet (10 mg total) by mouth every morning. Patient not taking: Reported on 08/07/2017 11/30/16   Hosie Poisson, MD  feeding supplement, ENSURE ENLIVE, (ENSURE ENLIVE) LIQD Take 237 mLs by mouth 2 (two) times daily between meals. Patient taking differently: Take 237 mLs by mouth 2 (two) times daily.  04/07/17   Hosie Poisson, MD  polyethylene glycol (MIRALAX / GLYCOLAX) packet Take 17 g  by mouth 2 (two) times daily. 05/31/17   Murlean Iba, MD   Allergies  Allergen Reactions  . Sulfa Antibiotics Hives  . Sulfasalazine Hives   Review of Systems  Unable to perform ROS   Physical Exam  Constitutional: No distress.  Pulmonary/Chest: Effort normal.  Neurological:  Resting with eyes closed.     Vital Signs: BP 122/74   Pulse (!) 55   Temp 97.6 F (36.4 C) (Oral)   Resp 14   Ht 6' (1.829 m)   Wt 57.7 kg (127 lb 3.2 oz)   SpO2 100%   BMI 17.25 kg/m  Pain Assessment: Faces       SpO2: SpO2: 100 % O2 Device:SpO2: 100 % O2 Flow Rate: .   IO: Intake/output summary:   Intake/Output Summary (Last 24 hours) at 08/08/2017 1128 Last data filed at 08/08/2017 0900 Gross per 24 hour  Intake 2200 ml  Output 30 ml  Net 2170 ml    LBM: Last BM Date: 08/05/17 Baseline Weight: Weight: 54.4 kg (120 lb) Most recent weight: Weight: 57.7 kg (127 lb 3.2 oz)     Palliative Assessment/Data: 20%     Time In: 10:35 Time Out: 12:15 Time Total: 100 min Greater than 50%  of this time was spent counseling and coordinating care related to the above assessment and plan.  Signed by: Asencion Gowda, NP 08/08/2017 12:19 PM Office: [phone] 7am-7pm  Please see Amion for pager number and availability  Call primary team after hours   Please contact Palliative Medicine Team phone at [phone] for questions and concerns.  For individual provider: See Shea Evans

## 2017-08-08 NOTE — Consult Note (Signed)
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  Wound type:pressure injury Pressure Injury POA: Yes Measurement:Sacrum:  4 cm x 4 cm 75% adherent slough to wound bed Ischium:  2 cm x 4 cm x 0.1 cm  Wound RRN:HAFBXU:  Slough   Ischium:  Pink and moist Drainage (amount, consistency, odor) minimal serosanguinous  Necrotic odor Periwound:intact Dressing procedure/placement/frequency:Patient is comfort care at this time.  NS moist gauze to wound bed sacral wound.  Silicone foam to ischial wound.  Will not follow at this time.  Please re-consult if needed.  Domenic Moras RN BSN [place] Pager [phone]

## 2017-08-10 LAB — URINE CULTURE: Culture: >=100000 — AB

## 2017-09-02 DEATH — deceased

## 2018-09-09 IMAGING — DX DG CHEST 1V PORT
1 series · 1 of 1 positions shown · non-contrast
Comparison: June 30, 2017

CLINICAL DATA: Lethargic

EXAM:
PORTABLE CHEST 1 VIEW

[chest ap]
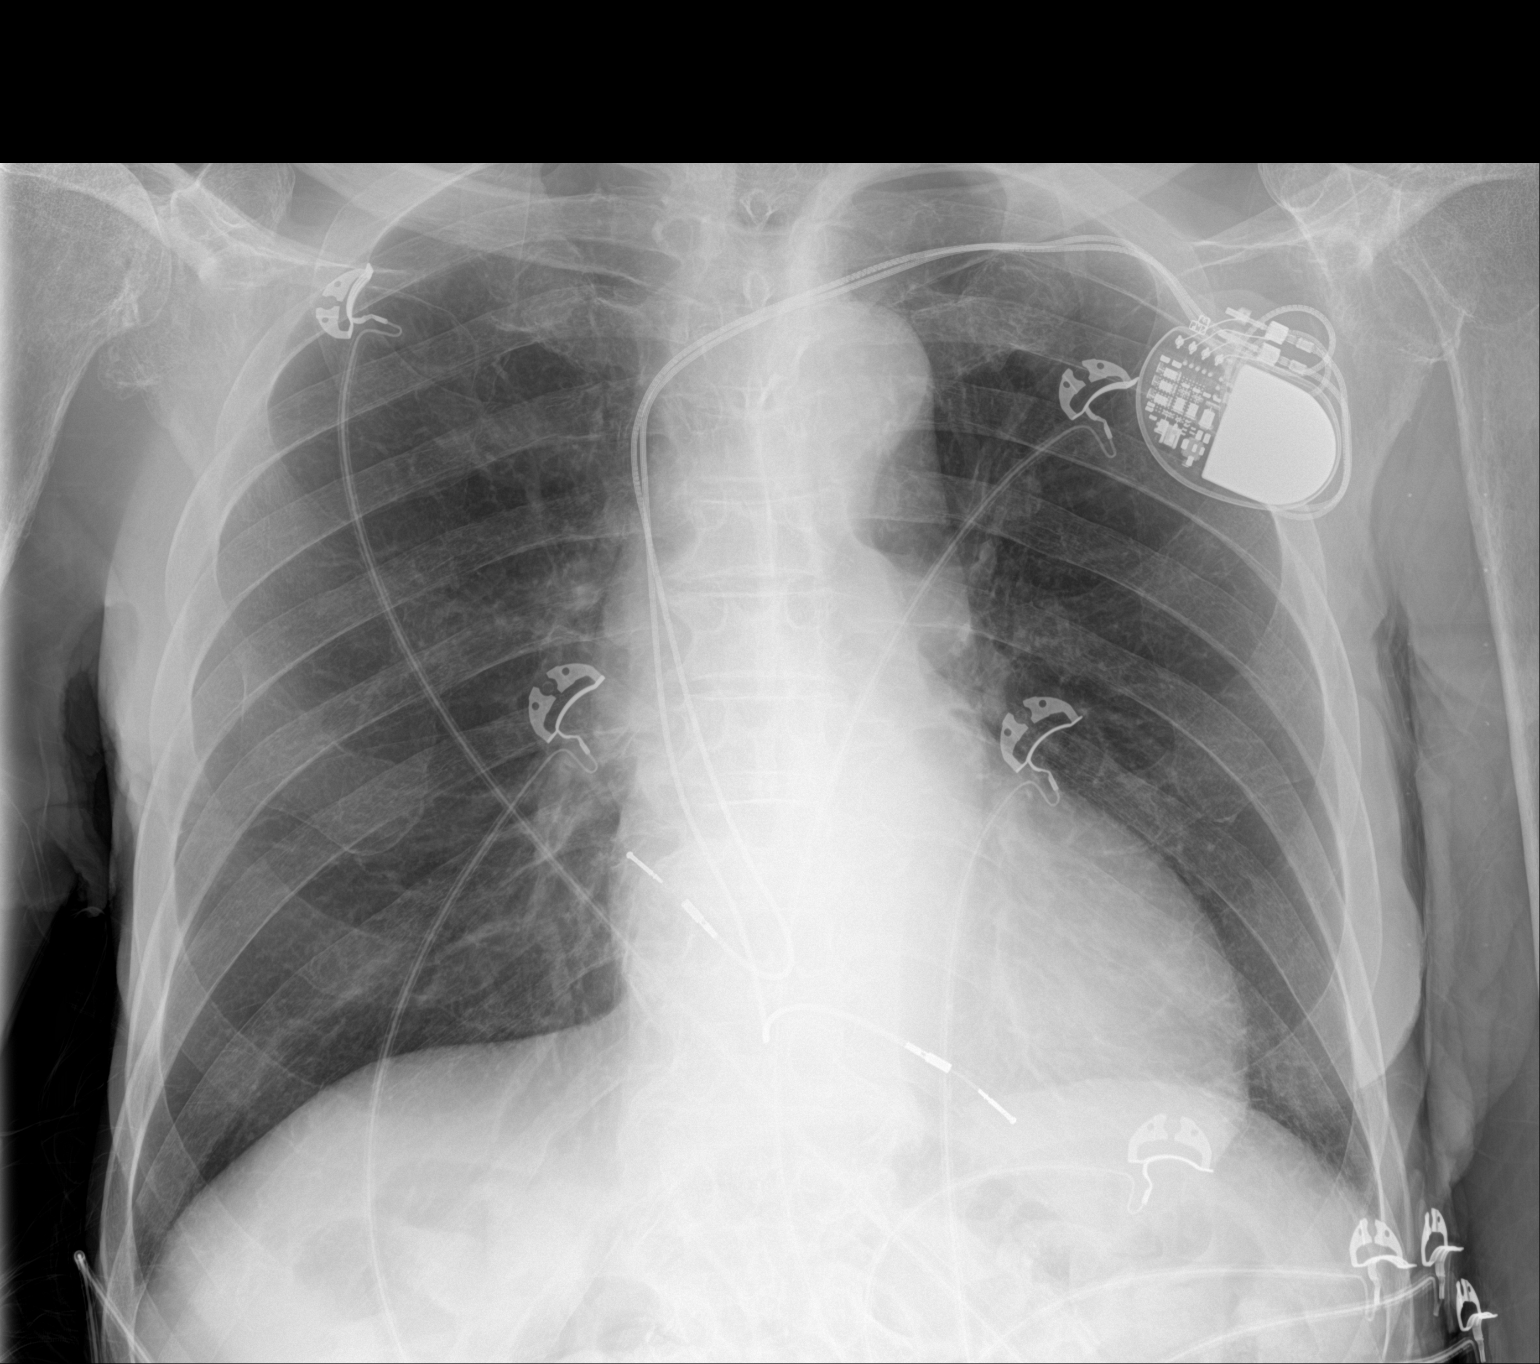

[1 of 1 positions shown; findings below may reference images not displayed]

FINDINGS: There is no edema or consolidation. Heart is mildly enlarged with
pulmonary vascularity within normal limits. Pacemaker leads are
attached to the right atrium and right ventricle. There is no
adenopathy. There is aortic atherosclerosis. No bone lesions.
IMPRESSION: Heart mildly enlarged with pacemaker leads attached to right atrium
and right ventricle. No edema or consolidation. There is aortic
atherosclerosis.

Aortic Atherosclerosis ([id number]).

## 2018-09-09 IMAGING — CT CT HEAD W/O CM
4 of 5 series · 14 of 47 positions shown, 16 images · non-contrast
Comparison: CT scan of June 30, 2017.

CLINICAL DATA: Encephalopathy.

EXAM:
CT HEAD WITHOUT CONTRAST
TECHNIQUE: Contiguous axial images were obtained from the base of the skull
through the vertex without intravenous contrast.

[Series 2: head wo · axial · 0.45mm/px · z∈[-101,-41]mm · 3 of 32 slices shown]
[im 7/32  brain]
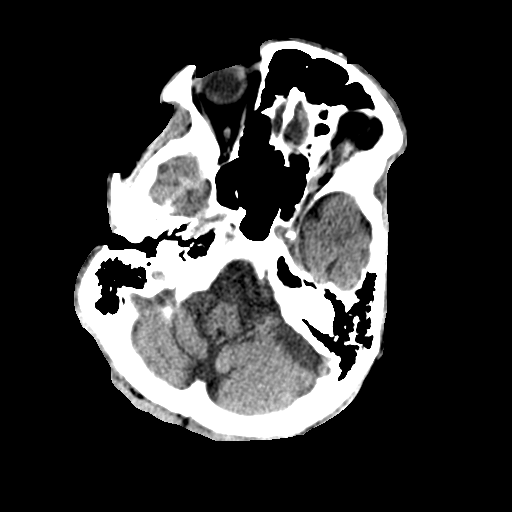
[im 13/32  brain]
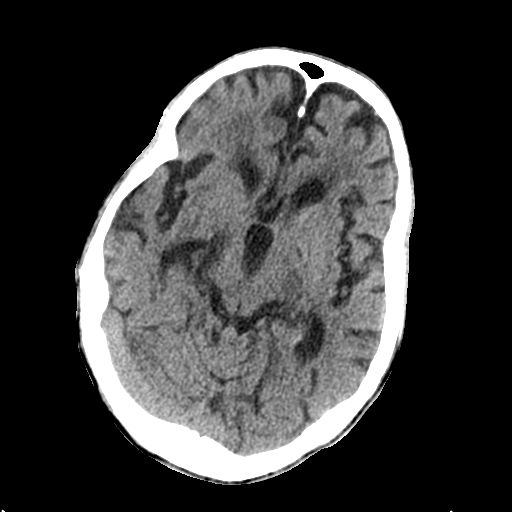
[im 19/32  brain]
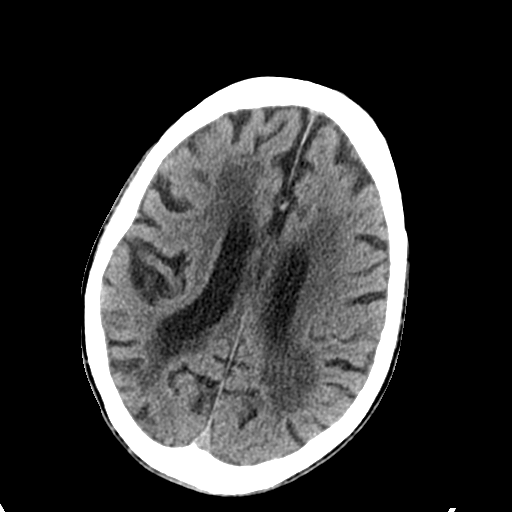

[Series 4: coronal soft tissue · coronal · 0.33mm/px · 3 of 70 slices shown]
[im 24/70  brain]
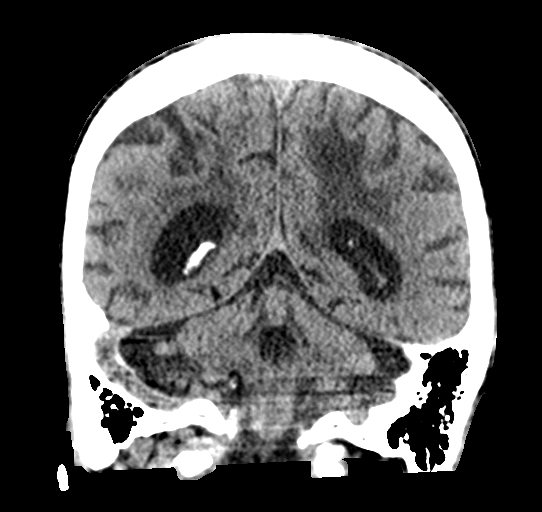
[im 31/70  brain]
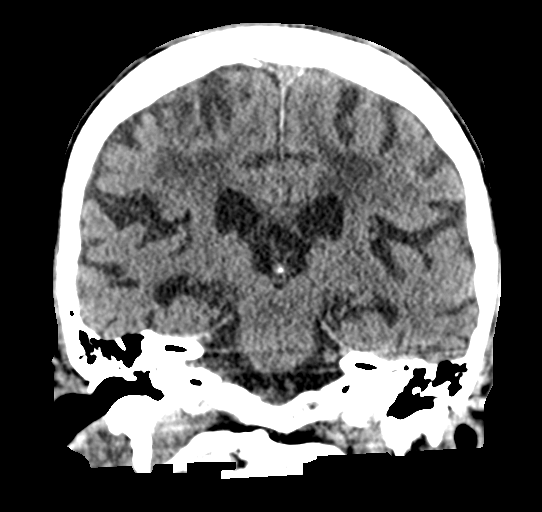
[im 39/70  brain]
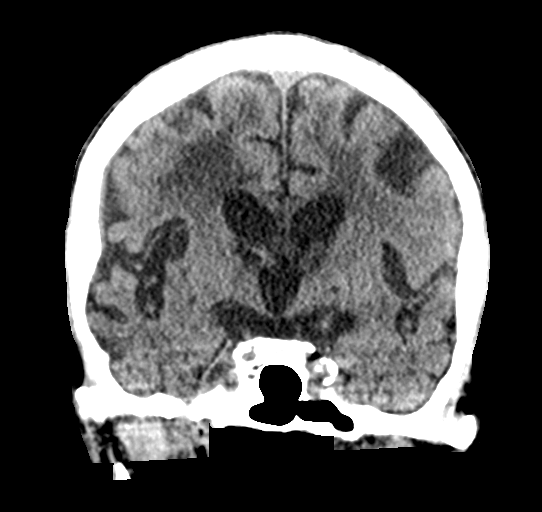

[Series 5: sagittal soft tissue · sagittal · 0.34mm/px · 3 of 53 slices shown]
[im 18/53  brain]
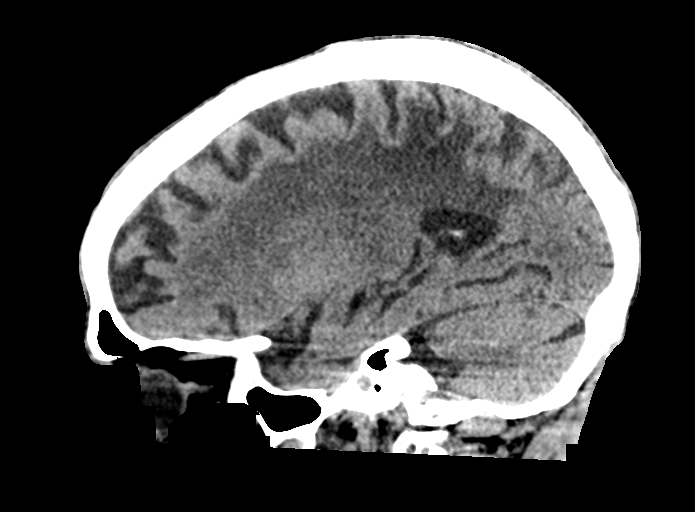
[im 27/53  brain]
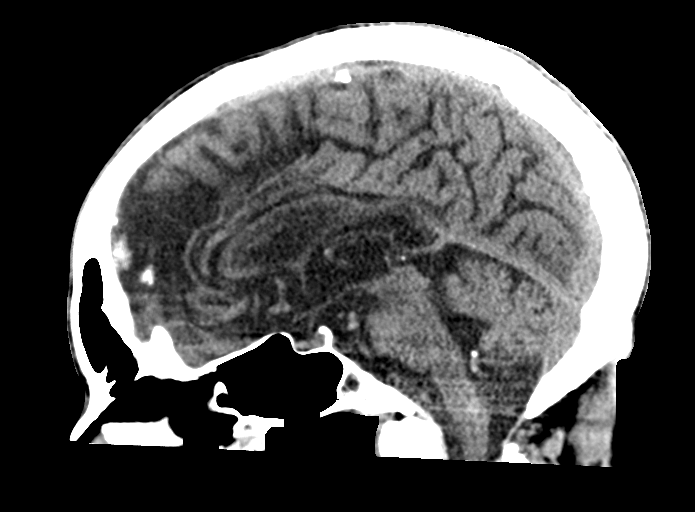
[im 35/53  brain]
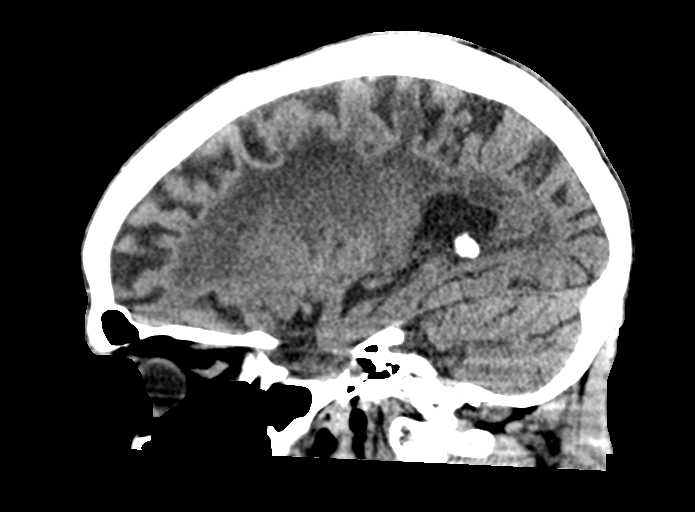

[Series 6: ax head wo recon · axial · 0.33mm/px · z∈[-102,-5]mm · 5 of 32 slices shown, 7 images]
[im 6/32  brain]
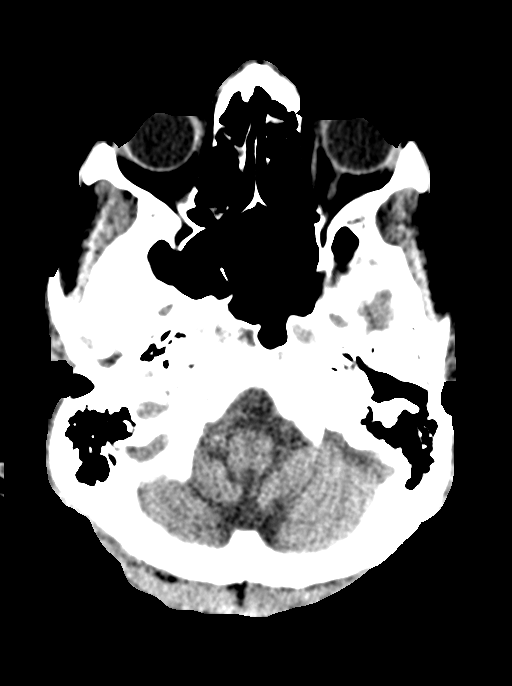
[im 6/32  bone]
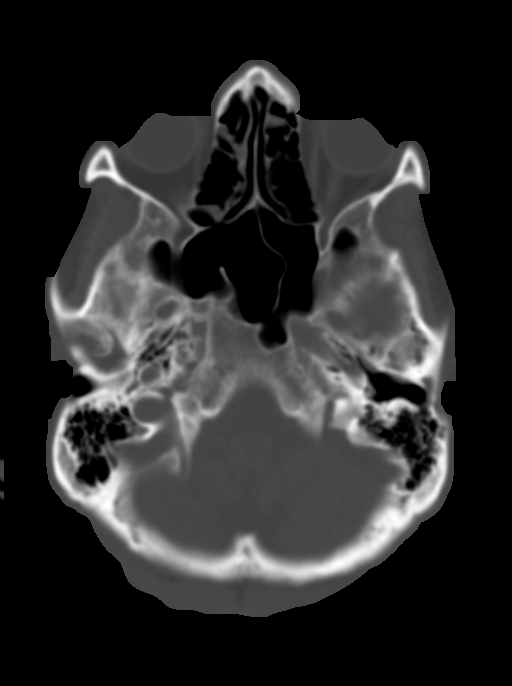
[im 11/32  brain]
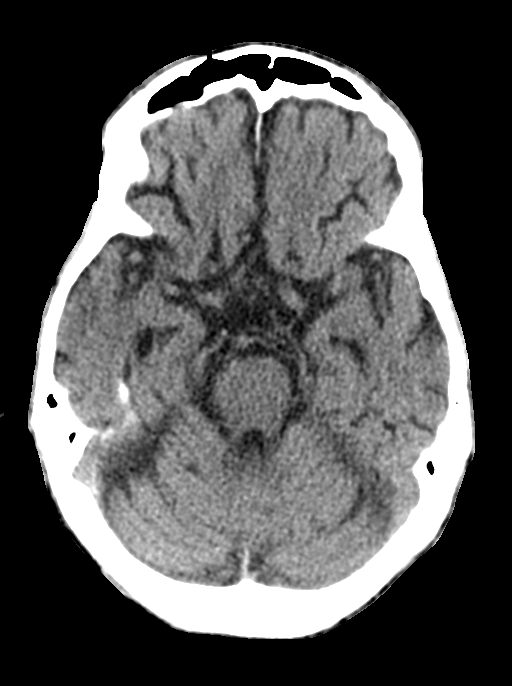
[im 16/32  brain]
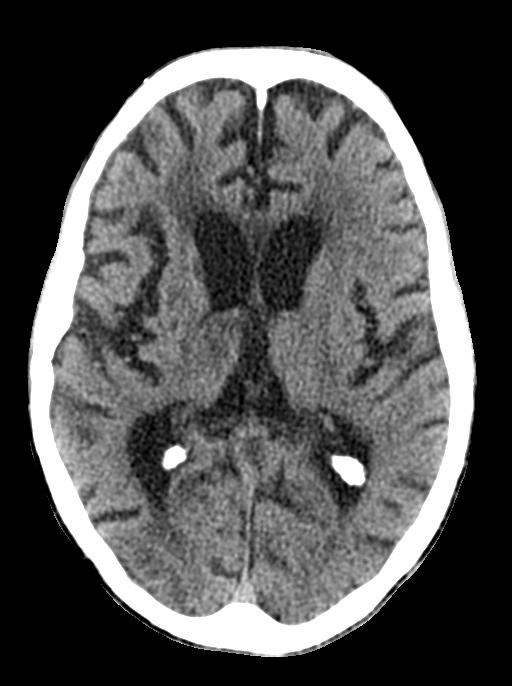
[im 21/32  brain]
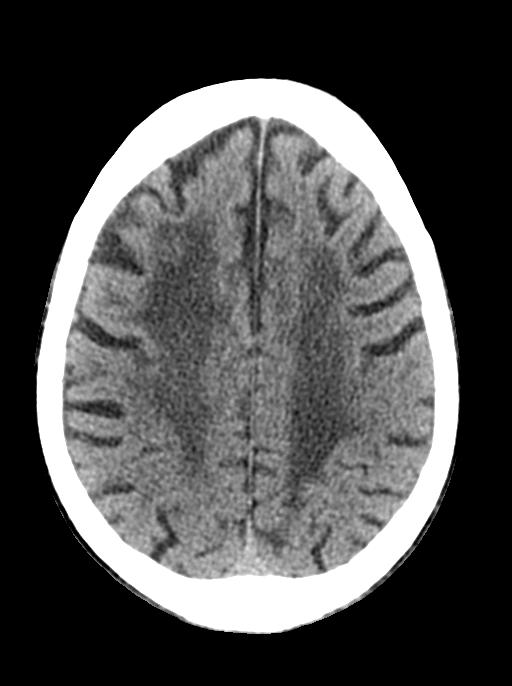
[im 26/32  brain]
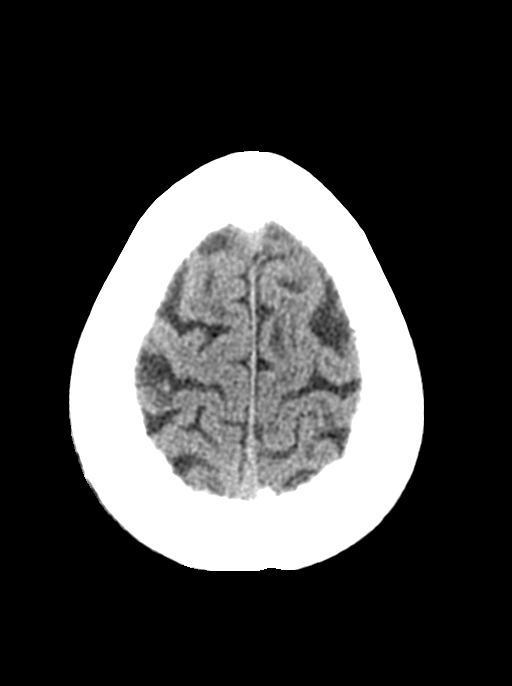
[im 26/32  bone]
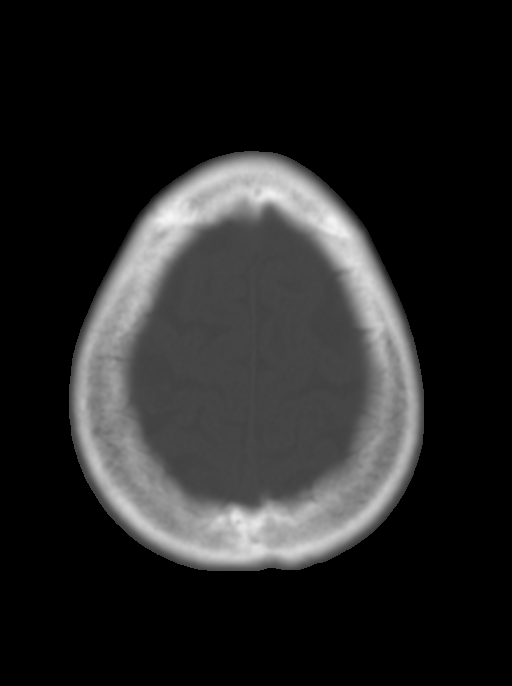

[14 of 47 positions shown; findings below may reference images not displayed]

FINDINGS: Brain: Mild diffuse cortical atrophy is noted. Moderate chronic
ischemic white matter disease is noted. Stable old left pontine
infarction is noted. No mass effect or midline shift is noted.
Ventricular size is within normal limits. There is no evidence of
mass lesion, hemorrhage or acute infarction.

Vascular: No hyperdense vessel or unexpected calcification.

Skull: Normal. Negative for fracture or focal lesion.

Sinuses/Orbits: No acute finding.

Other: None.
IMPRESSION: Mild diffuse cortical atrophy. Moderate chronic ischemic white
matter disease. Stable old left pontine infarction. No acute
intracranial abnormality seen.
